# Patient Record
Sex: Male | Born: 1965 | Race: White | Hispanic: No | Marital: Single | Smoking: Current every day smoker
Health system: Southern US, Community
[De-identification: ages and names within clinical notes are randomized; demographics above are authoritative.]

## PROBLEM LIST (undated history)

## (undated) DIAGNOSIS — J45909 Unspecified asthma, uncomplicated: Secondary | ICD-10-CM

---

## 2011-12-06 ENCOUNTER — Ambulatory Visit: Payer: Self-pay | Admitting: Otolaryngology

## 2019-11-17 ENCOUNTER — Other Ambulatory Visit (HOSPITAL_COMMUNITY): Payer: Self-pay | Admitting: Orthopedic Surgery

## 2019-11-17 ENCOUNTER — Other Ambulatory Visit: Payer: Self-pay | Admitting: Orthopedic Surgery

## 2019-11-17 ENCOUNTER — Ambulatory Visit: Admission: RE | Admit: 2019-11-17 | Payer: Self-pay | Source: Ambulatory Visit

## 2019-11-17 DIAGNOSIS — M7989 Other specified soft tissue disorders: Secondary | ICD-10-CM

## 2019-11-23 ENCOUNTER — Ambulatory Visit
Admission: RE | Admit: 2019-11-23 | Discharge: 2019-11-23 | Disposition: A | Discharge status: Home / Self Care | Payer: Self-pay | Source: Ambulatory Visit | Attending: Orthopedic Surgery | Admitting: Orthopedic Surgery

## 2019-11-23 ENCOUNTER — Other Ambulatory Visit: Payer: Self-pay

## 2019-11-23 DIAGNOSIS — M7989 Other specified soft tissue disorders: Secondary | ICD-10-CM | POA: Insufficient documentation

## 2019-11-23 IMAGING — US US EXTREM LOW VENOUS*R*
1 series · 14 of 24 positions shown · non-contrast
Comparison: None.

CLINICAL DATA: Right lower leg swelling for 1 week

EXAM:
RIGHT LOWER EXTREMITY VENOUS DOPPLER ULTRASOUND
TECHNIQUE: Gray-scale sonography with compression, as well as color and duplex
ultrasound, were performed to evaluate the deep venous system(s)
from the level of the common femoral vein through the popliteal and
proximal calf veins.

[Series 1: us extrem low venous*right* · 0.08mm/px · 14 of 47 slices shown]
[im 1/47]
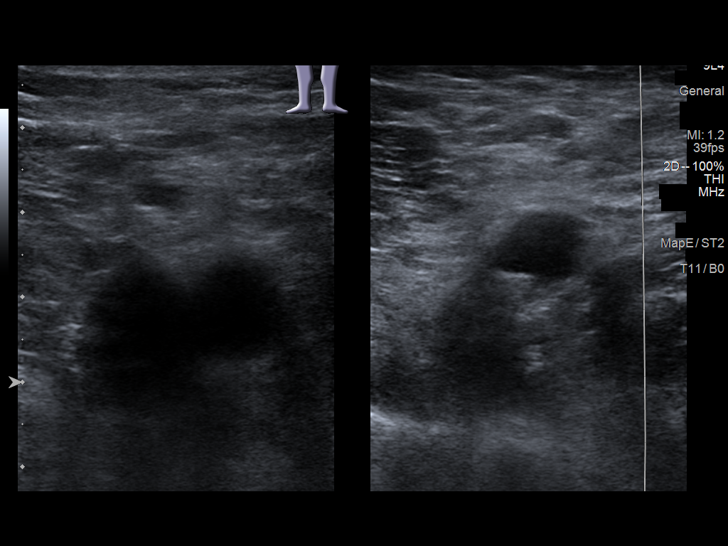
[im 5/47]
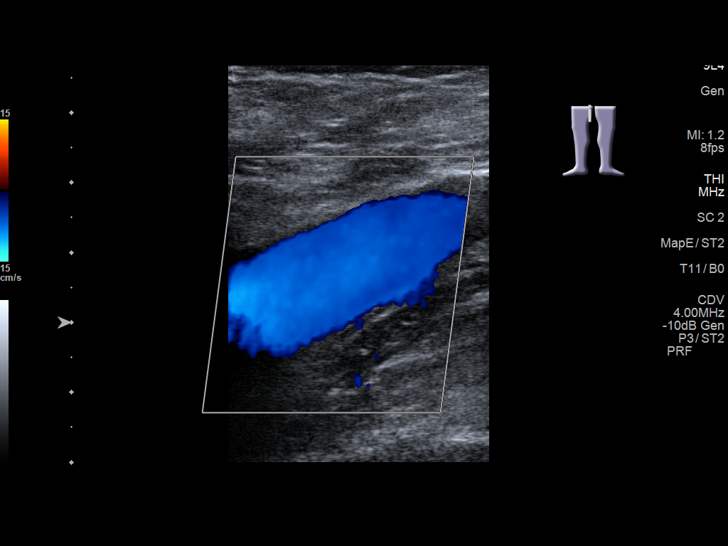
[im 9/47]
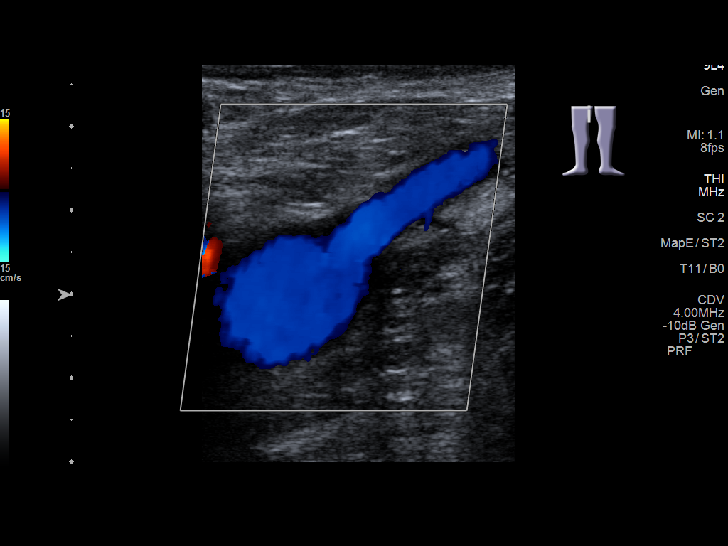
[im 13/47]
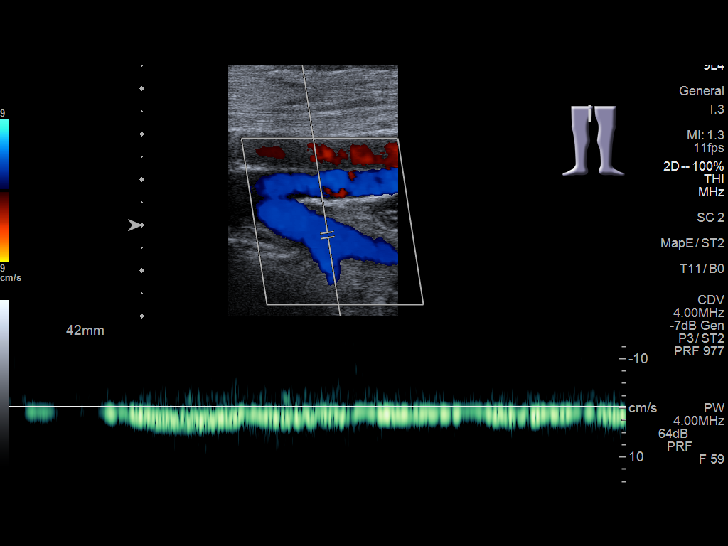
[im 15/47]
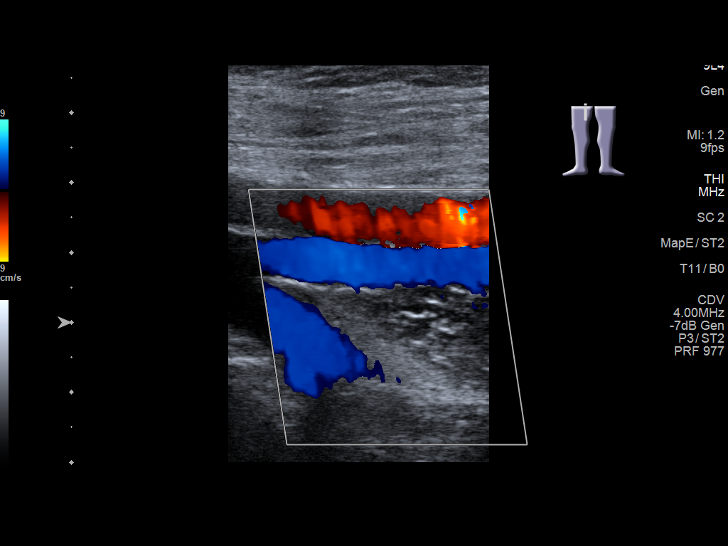
[im 19/47]
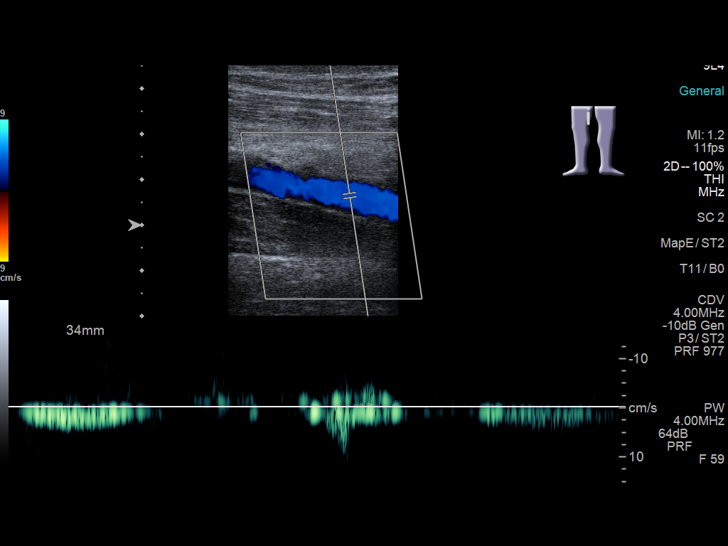
[im 23/47]
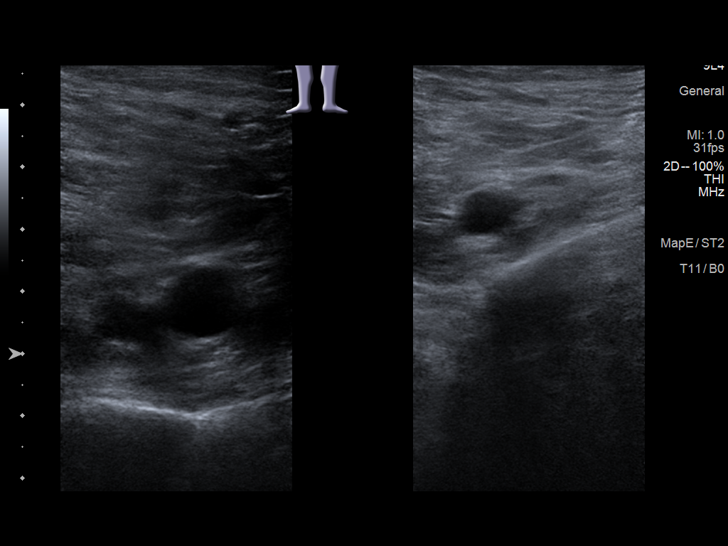
[im 25/47]
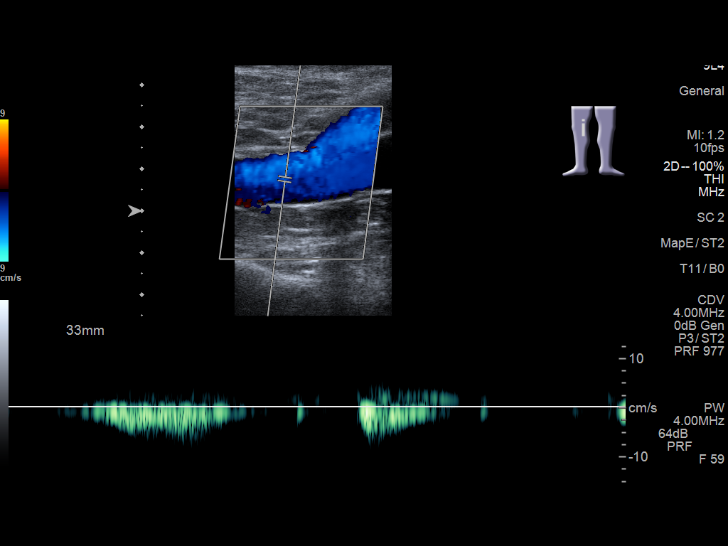
[im 29/47]
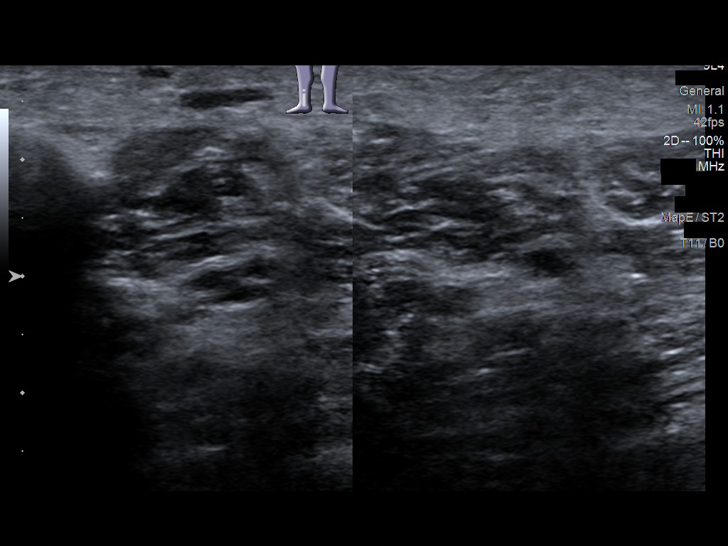
[im 33/47]
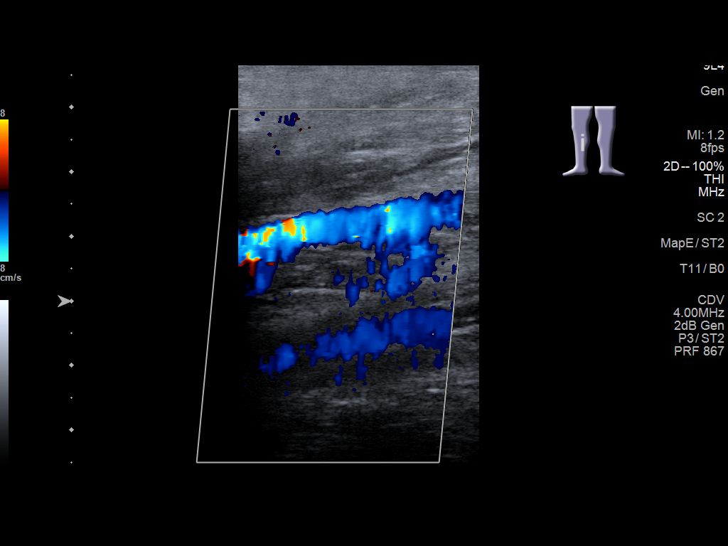
[im 37/47]
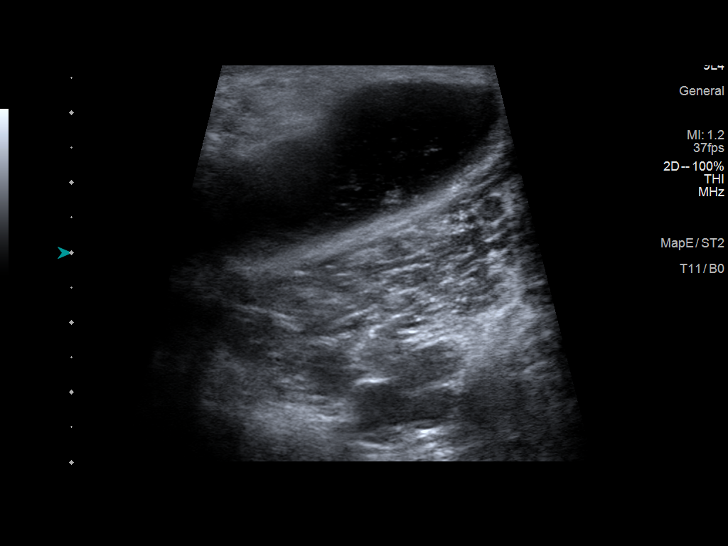
[im 39/47]
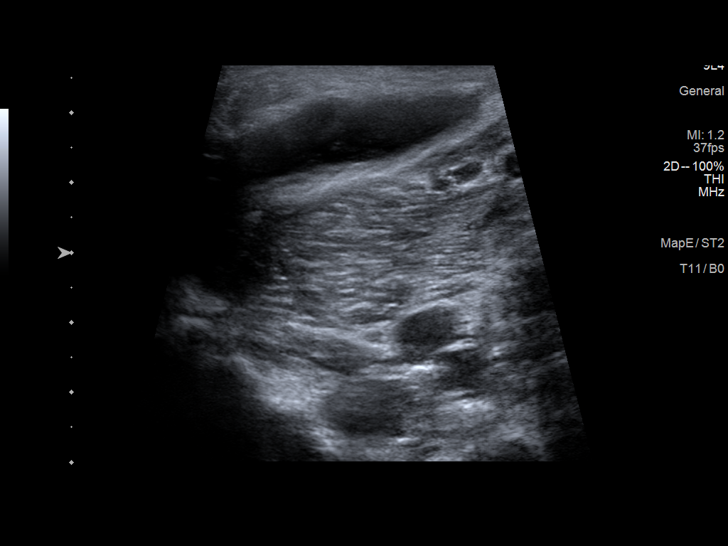
[im 43/47]
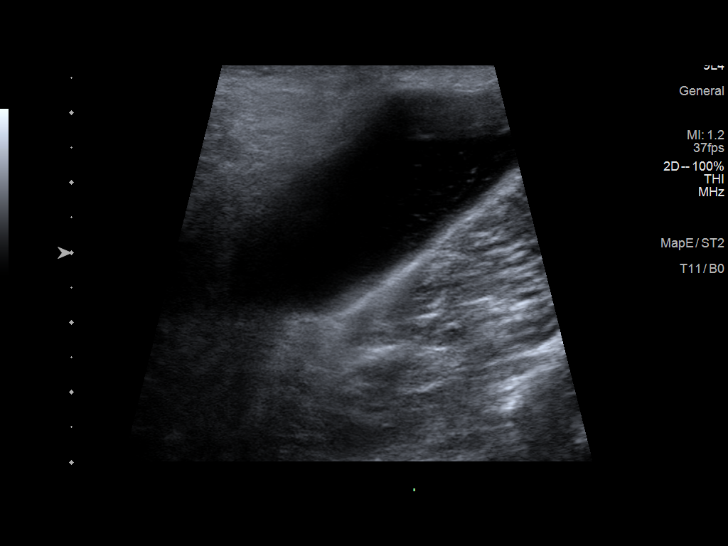
[im 47/47]
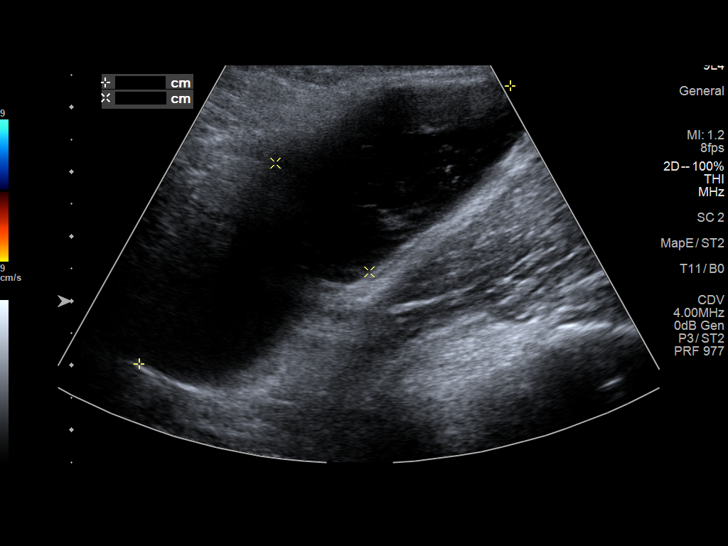

[14 of 24 positions shown; findings below may reference images not displayed]

FINDINGS: VENOUS

Normal compressibility of the common femoral, superficial femoral,
and popliteal veins, as well as the visualized calf veins.
Visualized portions of profunda femoral vein and great saphenous
vein unremarkable. No filling defects to suggest DVT on grayscale or
color Doppler imaging. Doppler waveforms show normal direction of
venous flow, normal respiratory plasticity and response to
augmentation.

Limited views of the contralateral common femoral vein are
unremarkable.

OTHER

Incidental 6.6 x 2.1 by 7.2 cm fluid collection in the popliteal
fossa compatible with a Baker's cyst.

Limitations: none
IMPRESSION: 1. No evidence of deep venous thrombosis within the right lower
extremity.
2. Large right Baker's cyst.

## 2020-04-25 IMAGING — CR DG HAND COMPLETE 3+V*R*
1 series · 3 of 3 positions shown · non-contrast
Comparison: None

CLINICAL DATA: Diaphoresis.  Pain.

EXAM:
RIGHT HAND - COMPLETE 3+ VIEW

[Series 1: dg hand complete right · 0.14mm/px · 3 of 3 slices shown]
[im 1/3]
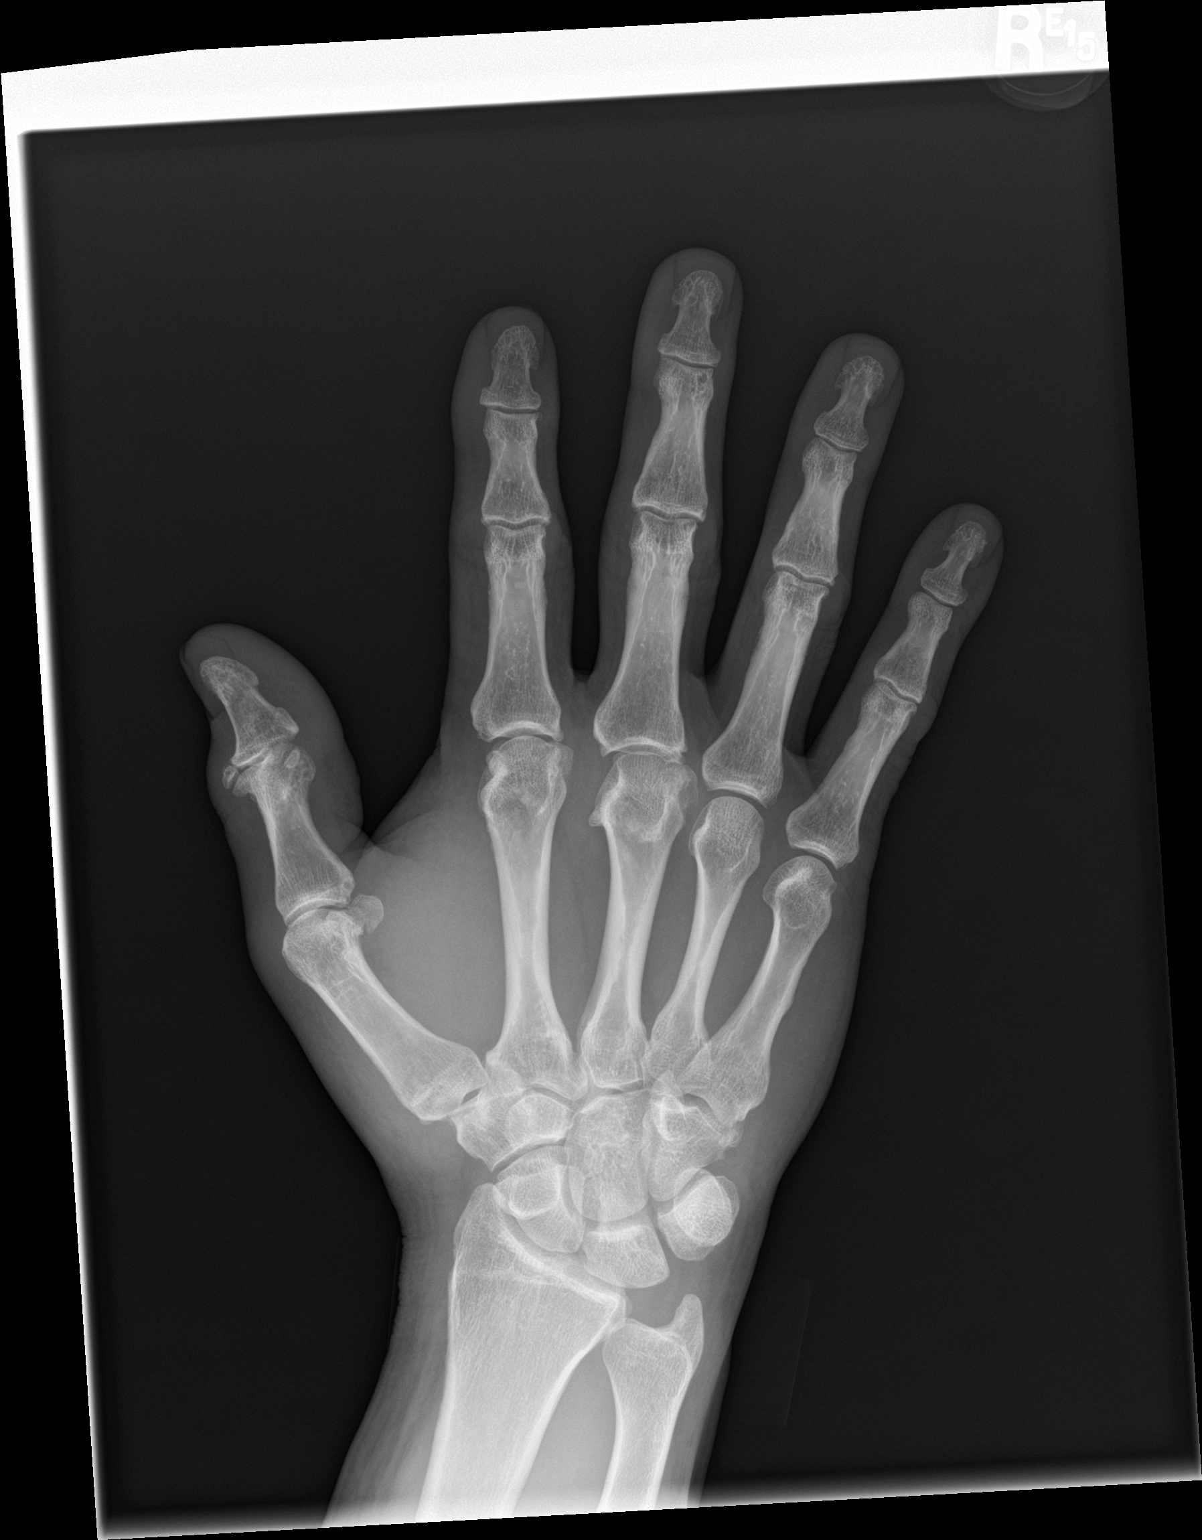
[im 2/3]
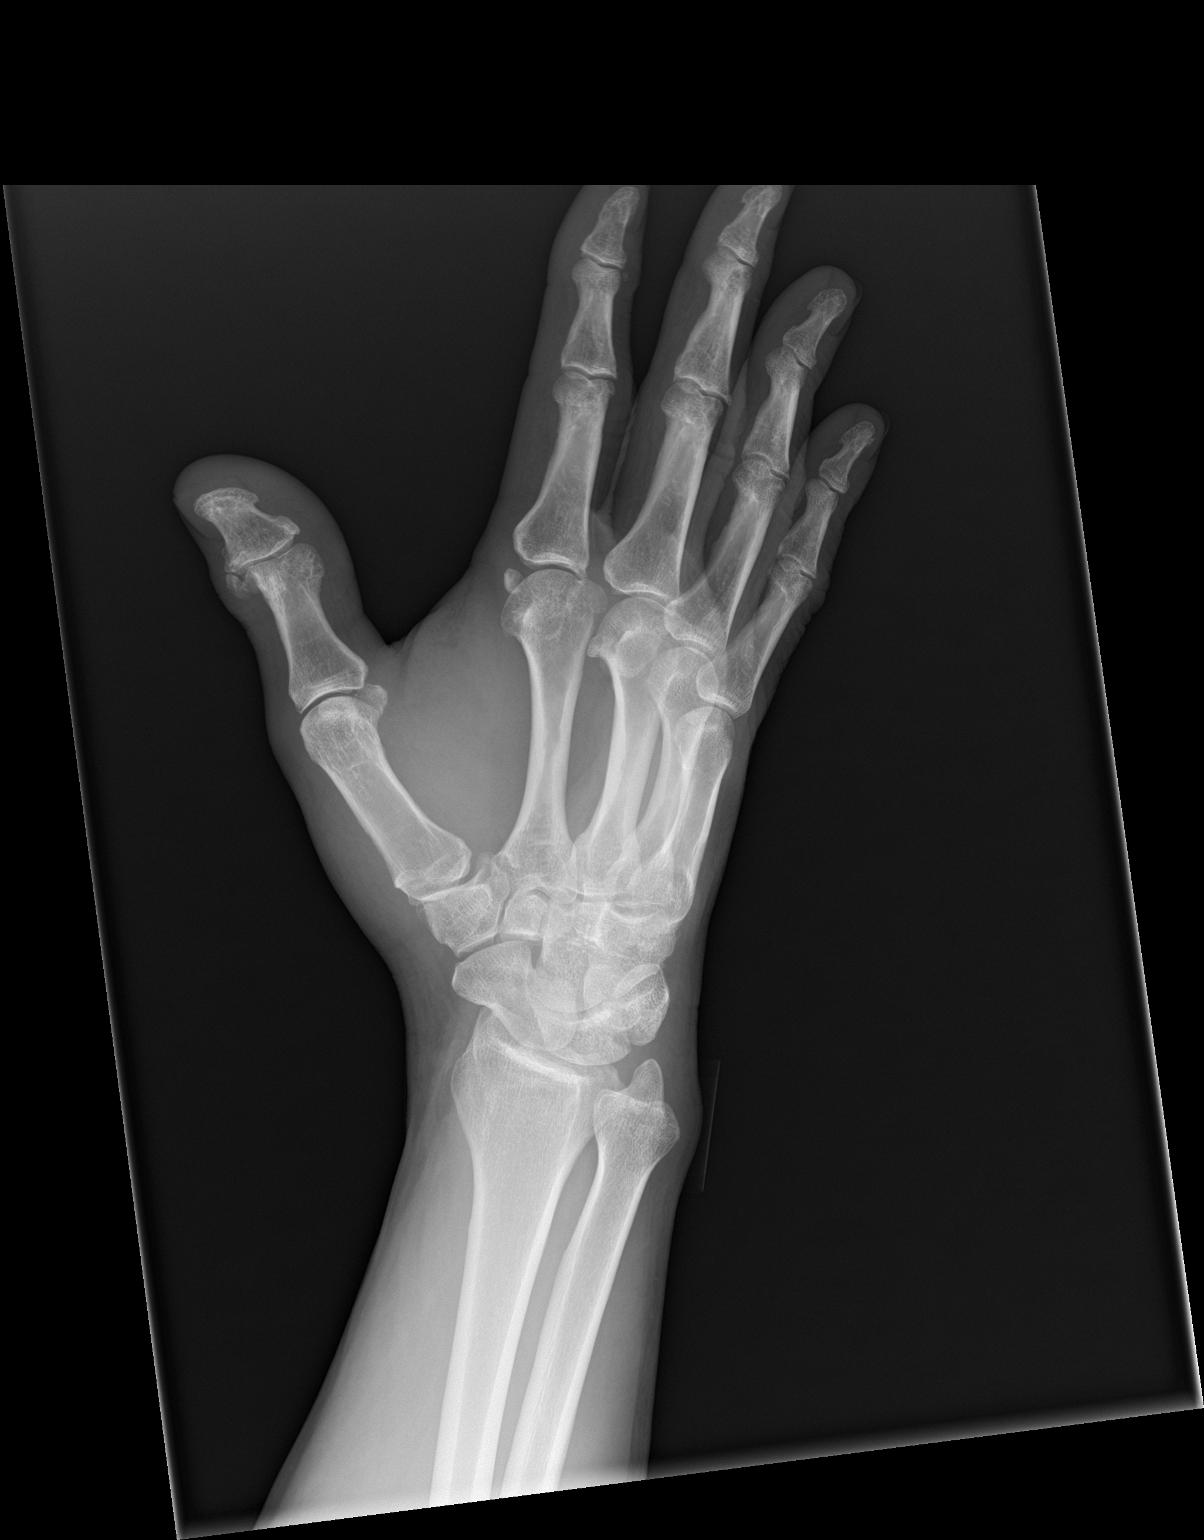
[im 3/3]
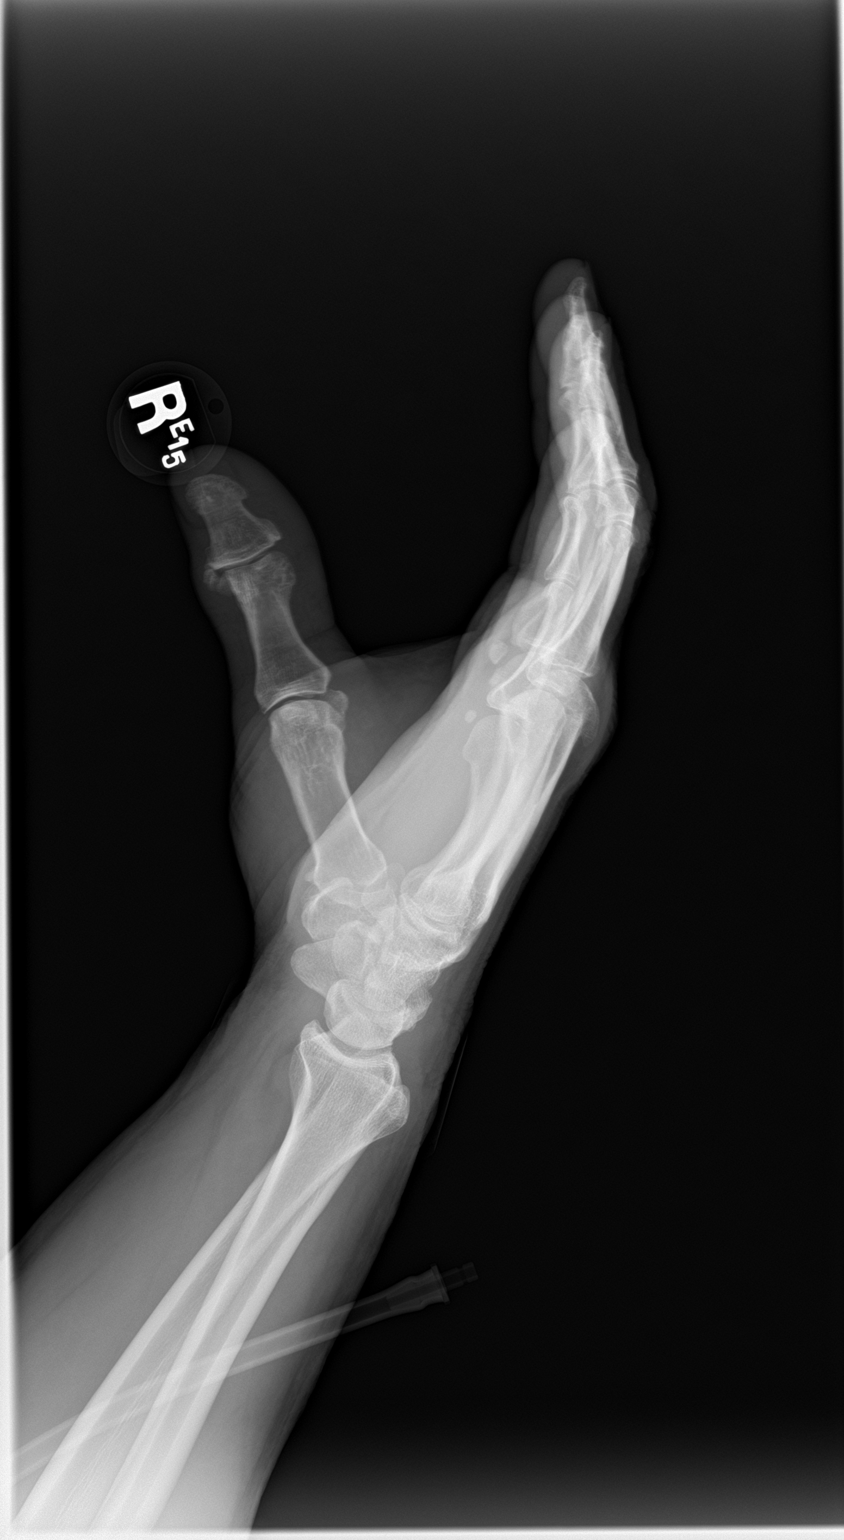

[3 of 3 positions shown; findings below may reference images not displayed]

FINDINGS: No acute or traumatic finding. Chronic degenerative arthritis of the
inter phalangeal joint of the thumb.
IMPRESSION: No acute or traumatic finding.

## 2020-11-21 ENCOUNTER — Other Ambulatory Visit: Payer: Self-pay

## 2020-11-21 ENCOUNTER — Emergency Department: Payer: Medicaid Other

## 2020-11-21 ENCOUNTER — Inpatient Hospital Stay
Admission: EM | Admit: 2020-11-21 | Discharge: 2020-12-12 | DRG: 870 | Disposition: A | Discharge status: Home Health | Payer: Medicaid Other | Attending: Internal Medicine | Admitting: Internal Medicine

## 2020-11-21 DIAGNOSIS — S32010A Wedge compression fracture of first lumbar vertebra, initial encounter for closed fracture: Secondary | ICD-10-CM

## 2020-11-21 DIAGNOSIS — J9621 Acute and chronic respiratory failure with hypoxia: Secondary | ICD-10-CM

## 2020-11-21 DIAGNOSIS — J9690 Respiratory failure, unspecified, unspecified whether with hypoxia or hypercapnia: Secondary | ICD-10-CM

## 2020-11-21 DIAGNOSIS — E870 Hyperosmolality and hypernatremia: Secondary | ICD-10-CM | POA: Diagnosis not present

## 2020-11-21 DIAGNOSIS — E43 Unspecified severe protein-calorie malnutrition: Secondary | ICD-10-CM | POA: Diagnosis present

## 2020-11-21 DIAGNOSIS — J189 Pneumonia, unspecified organism: Secondary | ICD-10-CM

## 2020-11-21 DIAGNOSIS — A419 Sepsis, unspecified organism: Principal | ICD-10-CM | POA: Diagnosis present

## 2020-11-21 DIAGNOSIS — Z4659 Encounter for fitting and adjustment of other gastrointestinal appliance and device: Secondary | ICD-10-CM

## 2020-11-21 DIAGNOSIS — R296 Repeated falls: Secondary | ICD-10-CM | POA: Diagnosis present

## 2020-11-21 DIAGNOSIS — K567 Ileus, unspecified: Secondary | ICD-10-CM | POA: Diagnosis not present

## 2020-11-21 DIAGNOSIS — Y92009 Unspecified place in unspecified non-institutional (private) residence as the place of occurrence of the external cause: Secondary | ICD-10-CM

## 2020-11-21 DIAGNOSIS — R6520 Severe sepsis without septic shock: Secondary | ICD-10-CM | POA: Diagnosis not present

## 2020-11-21 DIAGNOSIS — Z6836 Body mass index (BMI) 36.0-36.9, adult: Secondary | ICD-10-CM | POA: Diagnosis not present

## 2020-11-21 DIAGNOSIS — J441 Chronic obstructive pulmonary disease with (acute) exacerbation: Secondary | ICD-10-CM

## 2020-11-21 DIAGNOSIS — I5021 Acute systolic (congestive) heart failure: Secondary | ICD-10-CM | POA: Diagnosis not present

## 2020-11-21 DIAGNOSIS — F17290 Nicotine dependence, other tobacco product, uncomplicated: Secondary | ICD-10-CM | POA: Diagnosis present

## 2020-11-21 DIAGNOSIS — J9602 Acute respiratory failure with hypercapnia: Secondary | ICD-10-CM | POA: Diagnosis present

## 2020-11-21 DIAGNOSIS — F10931 Alcohol use, unspecified with withdrawal delirium: Secondary | ICD-10-CM

## 2020-11-21 DIAGNOSIS — J90 Pleural effusion, not elsewhere classified: Secondary | ICD-10-CM

## 2020-11-21 DIAGNOSIS — J9819 Other pulmonary collapse: Secondary | ICD-10-CM | POA: Diagnosis not present

## 2020-11-21 DIAGNOSIS — Z20822 Contact with and (suspected) exposure to covid-19: Secondary | ICD-10-CM | POA: Diagnosis present

## 2020-11-21 DIAGNOSIS — Z9889 Other specified postprocedural states: Secondary | ICD-10-CM

## 2020-11-21 DIAGNOSIS — J439 Emphysema, unspecified: Secondary | ICD-10-CM | POA: Diagnosis present

## 2020-11-21 DIAGNOSIS — G928 Other toxic encephalopathy: Secondary | ICD-10-CM | POA: Diagnosis not present

## 2020-11-21 DIAGNOSIS — R52 Pain, unspecified: Secondary | ICD-10-CM

## 2020-11-21 DIAGNOSIS — J690 Pneumonitis due to inhalation of food and vomit: Secondary | ICD-10-CM | POA: Diagnosis not present

## 2020-11-21 DIAGNOSIS — R0902 Hypoxemia: Secondary | ICD-10-CM

## 2020-11-21 DIAGNOSIS — R509 Fever, unspecified: Secondary | ICD-10-CM

## 2020-11-21 DIAGNOSIS — F09 Unspecified mental disorder due to known physiological condition: Secondary | ICD-10-CM | POA: Diagnosis not present

## 2020-11-21 DIAGNOSIS — E861 Hypovolemia: Secondary | ICD-10-CM | POA: Diagnosis present

## 2020-11-21 DIAGNOSIS — R0602 Shortness of breath: Secondary | ICD-10-CM | POA: Diagnosis present

## 2020-11-21 DIAGNOSIS — S32019A Unspecified fracture of first lumbar vertebra, initial encounter for closed fracture: Secondary | ICD-10-CM | POA: Diagnosis present

## 2020-11-21 DIAGNOSIS — E669 Obesity, unspecified: Secondary | ICD-10-CM | POA: Diagnosis present

## 2020-11-21 DIAGNOSIS — F10239 Alcohol dependence with withdrawal, unspecified: Secondary | ICD-10-CM | POA: Diagnosis present

## 2020-11-21 DIAGNOSIS — Z23 Encounter for immunization: Secondary | ICD-10-CM | POA: Diagnosis not present

## 2020-11-21 DIAGNOSIS — F10231 Alcohol dependence with withdrawal delirium: Secondary | ICD-10-CM | POA: Diagnosis present

## 2020-11-21 DIAGNOSIS — R4182 Altered mental status, unspecified: Secondary | ICD-10-CM

## 2020-11-21 DIAGNOSIS — W19XXXA Unspecified fall, initial encounter: Secondary | ICD-10-CM | POA: Diagnosis present

## 2020-11-21 DIAGNOSIS — R531 Weakness: Secondary | ICD-10-CM

## 2020-11-21 DIAGNOSIS — Z978 Presence of other specified devices: Secondary | ICD-10-CM

## 2020-11-21 DIAGNOSIS — E8770 Fluid overload, unspecified: Secondary | ICD-10-CM

## 2020-11-21 DIAGNOSIS — J9601 Acute respiratory failure with hypoxia: Secondary | ICD-10-CM | POA: Diagnosis present

## 2020-11-21 DIAGNOSIS — R609 Edema, unspecified: Secondary | ICD-10-CM

## 2020-11-21 DIAGNOSIS — Z9911 Dependence on respirator [ventilator] status: Secondary | ICD-10-CM

## 2020-11-21 DIAGNOSIS — G9340 Encephalopathy, unspecified: Secondary | ICD-10-CM

## 2020-11-21 HISTORY — DX: Unspecified asthma, uncomplicated: J45.909

## 2020-11-21 LAB — BASIC METABOLIC PANEL
Anion gap: 18 — ABNORMAL HIGH (ref 5–15)
BUN: 26 mg/dL — ABNORMAL HIGH (ref 6–20)
CO2: 24 mmol/L (ref 22–32)
Calcium: 9.4 mg/dL (ref 8.9–10.3)
Chloride: 91 mmol/L — ABNORMAL LOW (ref 98–111)
Creatinine, Ser: 1.08 mg/dL (ref 0.61–1.24)
GFR, Estimated: >60 mL/min (ref >60)
Glucose, Bld: 118 mg/dL — ABNORMAL HIGH (ref 70–99)
Potassium: 3.3 mmol/L — ABNORMAL LOW (ref 3.5–5.1)
Sodium: 133 mmol/L — ABNORMAL LOW (ref 135–145)

## 2020-11-21 LAB — LACTIC ACID, PLASMA
Lactic Acid, Venous: 3.6 mmol/L (ref 0.5–1.9)
Lactic Acid, Venous: 3 mmol/L (ref 0.5–1.9)
Lactic Acid, Venous: 1.5 mmol/L (ref 0.5–1.9)

## 2020-11-21 LAB — URINE DRUG SCREEN, QUALITATIVE (ARMC ONLY)
Amphetamines, Ur Screen: NOT DETECTED
Barbiturates, Ur Screen: NOT DETECTED
Benzodiazepine, Ur Scrn: POSITIVE — AB
Cannabinoid 50 Ng, Ur ~~LOC~~: POSITIVE — AB
Cocaine Metabolite,Ur ~~LOC~~: NOT DETECTED
MDMA (Ecstasy)Ur Screen: NOT DETECTED
Methadone Scn, Ur: NOT DETECTED
Opiate, Ur Screen: NOT DETECTED
Phencyclidine (PCP) Ur S: NOT DETECTED
Tricyclic, Ur Screen: POSITIVE — AB

## 2020-11-21 LAB — CBC WITH DIFFERENTIAL/PLATELET
Abs Immature Granulocytes: 0.74 10*3/uL — ABNORMAL HIGH (ref 0.00–0.07)
Basophils Absolute: 0.1 10*3/uL (ref 0.0–0.1)
Basophils Relative: 0 %
Eosinophils Absolute: 0.1 10*3/uL (ref 0.0–0.5)
Eosinophils Relative: 1 %
HCT: 46.8 % (ref 39.0–52.0)
Hemoglobin: 16.6 g/dL (ref 13.0–17.0)
Immature Granulocytes: 5 %
Lymphocytes Relative: 3 %
Lymphs Abs: 0.5 10*3/uL — ABNORMAL LOW (ref 0.7–4.0)
MCH: 35 pg — ABNORMAL HIGH (ref 26.0–34.0)
MCHC: 35.5 g/dL (ref 30.0–36.0)
MCV: 98.7 fL (ref 80.0–100.0)
Monocytes Absolute: 1.2 10*3/uL — ABNORMAL HIGH (ref 0.1–1.0)
Monocytes Relative: 7 %
Neutro Abs: 13 10*3/uL — ABNORMAL HIGH (ref 1.7–7.7)
Neutrophils Relative %: 84 %
Platelets: 207 10*3/uL (ref 150–400)
RBC: 4.74 MIL/uL (ref 4.22–5.81)
RDW: 12.9 % (ref 11.5–15.5)
WBC: 15.6 10*3/uL — ABNORMAL HIGH (ref 4.0–10.5)
nRBC: 0 % (ref 0.0–0.2)

## 2020-11-21 LAB — HEPATIC FUNCTION PANEL
ALT: 26 U/L (ref 0–44)
AST: 42 U/L — ABNORMAL HIGH (ref 15–41)
Albumin: 3.8 g/dL (ref 3.5–5.0)
Alkaline Phosphatase: 90 U/L (ref 38–126)
Bilirubin, Direct: 0.7 mg/dL — ABNORMAL HIGH (ref 0.0–0.2)
Indirect Bilirubin: 1.3 mg/dL — ABNORMAL HIGH (ref 0.3–0.9)
Total Bilirubin: 2 mg/dL — ABNORMAL HIGH (ref 0.3–1.2)
Total Protein: 8.4 g/dL — ABNORMAL HIGH (ref 6.5–8.1)

## 2020-11-21 LAB — RESP PANEL BY RT-PCR (FLU A&B, COVID) ARPGX2
Influenza A by PCR: NEGATIVE
Influenza B by PCR: NEGATIVE
SARS Coronavirus 2 by RT PCR: NEGATIVE

## 2020-11-21 LAB — TROPONIN I (HIGH SENSITIVITY)
Troponin I (High Sensitivity): 12 ng/L (ref <18)
Troponin I (High Sensitivity): 17 ng/L (ref <18)

## 2020-11-21 LAB — BLOOD GAS, ARTERIAL
Acid-Base Excess: 1.7 mmol/L (ref 0.0–2.0)
Acid-Base Excess: 0.8 mmol/L (ref 0.0–2.0)
Bicarbonate: 24.4 mmol/L (ref 20.0–28.0)
Bicarbonate: 27.1 mmol/L (ref 20.0–28.0)
FIO2: 0.21
FIO2: 0.6
MECHVT: 500 mL
O2 Saturation: 96.7 %
O2 Saturation: 99.1 %
PEEP: 5 cmH2O
Patient temperature: 37
Patient temperature: 37
RATE: 18 resp/min
pCO2 arterial: 32 mmHg (ref 32.0–48.0)
pCO2 arterial: 49 mmHg — ABNORMAL HIGH (ref 32.0–48.0)
pH, Arterial: 7.49 — ABNORMAL HIGH (ref 7.350–7.450)
pH, Arterial: 7.35 (ref 7.350–7.450)
pO2, Arterial: 80 mmHg — ABNORMAL LOW (ref 83.0–108.0)
pO2, Arterial: 141 mmHg — ABNORMAL HIGH (ref 83.0–108.0)

## 2020-11-21 LAB — URINALYSIS, ROUTINE W REFLEX MICROSCOPIC
Glucose, UA: 100 mg/dL — AB
Ketones, ur: 40 mg/dL — AB
Leukocytes,Ua: NEGATIVE
Nitrite: NEGATIVE
Protein, ur: 30 mg/dL — AB
Specific Gravity, Urine: 1.015 (ref 1.005–1.030)
pH: 5.5 (ref 5.0–8.0)

## 2020-11-21 LAB — PROCALCITONIN: Procalcitonin: 12.87 ng/mL

## 2020-11-21 LAB — URINALYSIS, MICROSCOPIC (REFLEX)
Bacteria, UA: NONE SEEN
Squamous Epithelial / HPF: NONE SEEN (ref 0–5)

## 2020-11-21 LAB — BRAIN NATRIURETIC PEPTIDE: B Natriuretic Peptide: 108.8 pg/mL — ABNORMAL HIGH (ref 0.0–100.0)

## 2020-11-21 LAB — PHOSPHORUS: Phosphorus: 3.5 mg/dL (ref 2.5–4.6)

## 2020-11-21 LAB — AMYLASE: Amylase: 32 U/L (ref 28–100)

## 2020-11-21 LAB — MRSA NEXT GEN BY PCR, NASAL: MRSA by PCR Next Gen: NOT DETECTED

## 2020-11-21 LAB — TYPE AND SCREEN
ABO/RH(D): A POS
Antibody Screen: NEGATIVE

## 2020-11-21 LAB — GLUCOSE, CAPILLARY: Glucose-Capillary: 137 mg/dL — ABNORMAL HIGH (ref 70–99)

## 2020-11-21 LAB — ACETAMINOPHEN LEVEL: Acetaminophen (Tylenol), Serum: <10 ug/mL — ABNORMAL LOW (ref 10–30)

## 2020-11-21 LAB — OSMOLALITY: Osmolality: 286 mOsm/kg (ref 275–295)

## 2020-11-21 LAB — SALICYLATE LEVEL: Salicylate Lvl: <7 mg/dL — ABNORMAL LOW (ref 7.0–30.0)

## 2020-11-21 LAB — LIPASE, BLOOD: Lipase: 28 U/L (ref 11–51)

## 2020-11-21 IMAGING — CR DG CHEST 1V
1 series · 2 of 2 positions shown · non-contrast
Comparison: None.

CLINICAL DATA: SOB

EXAM:
CHEST  1 VIEW

[Series 1: dg chest 1 view · 0.14mm/px · 2 of 2 slices shown]
[im 1/2]
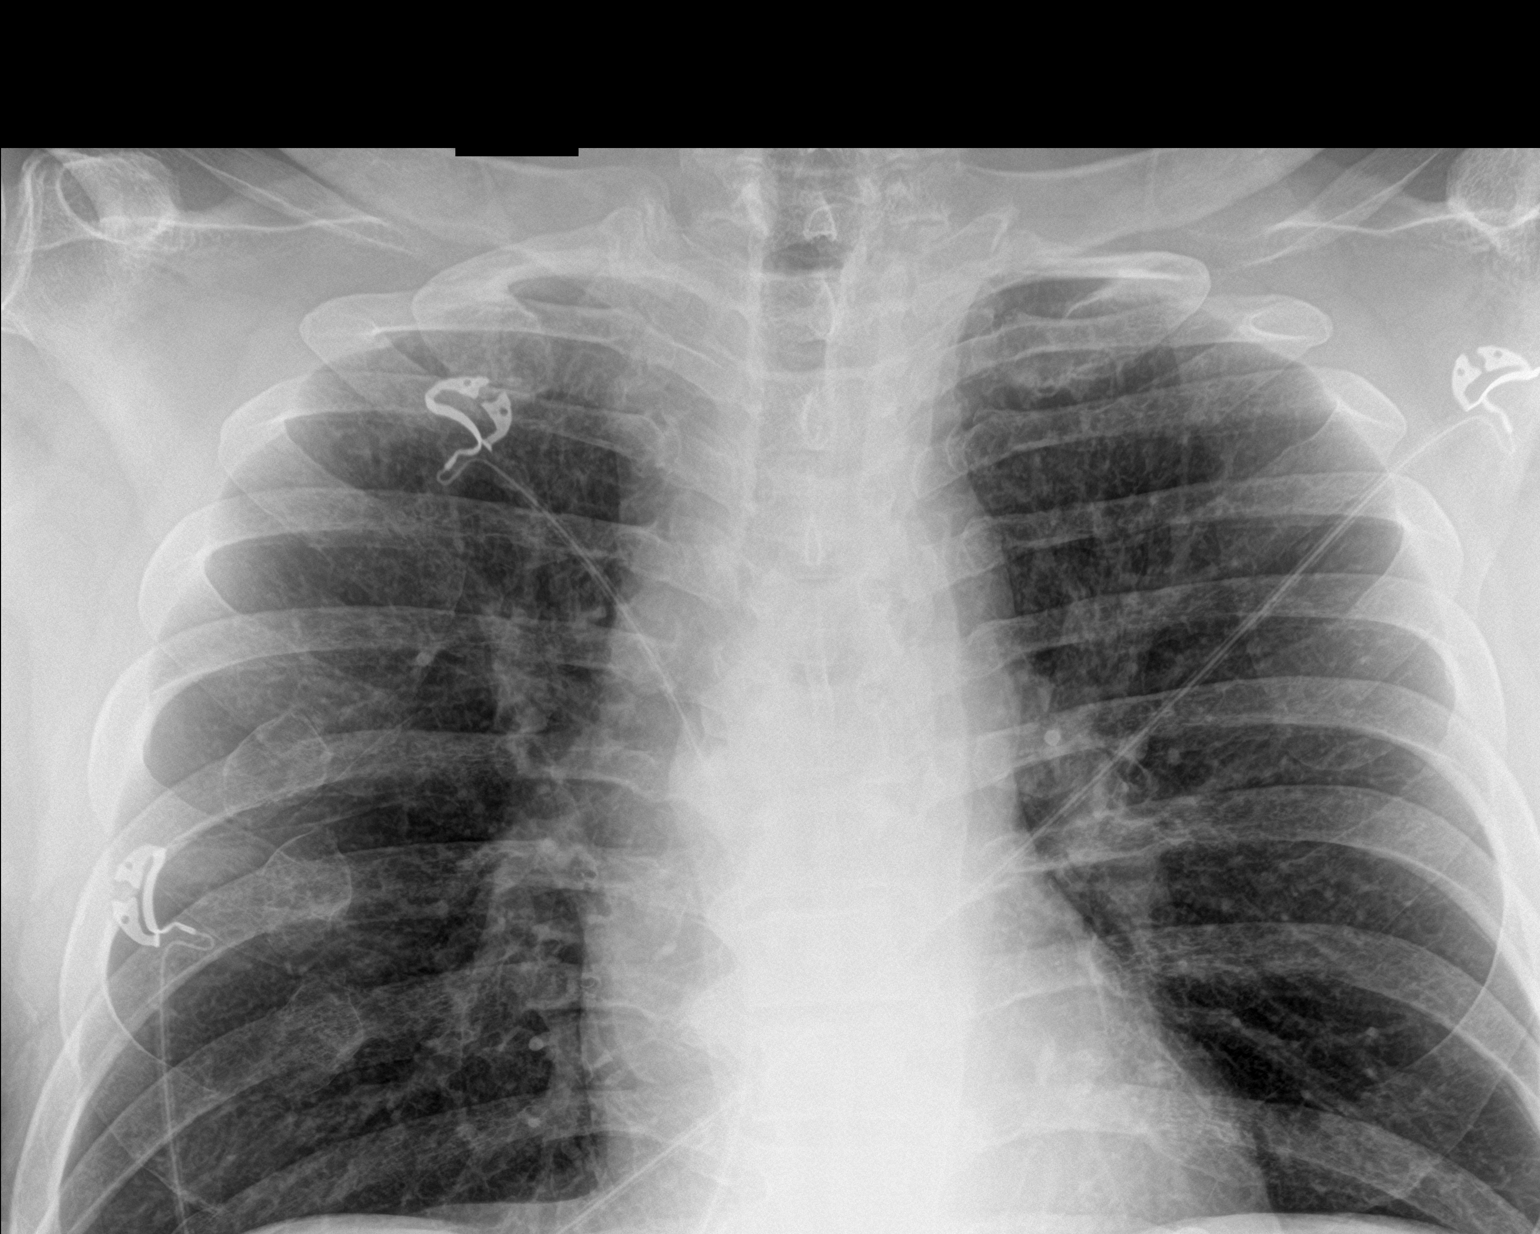
[im 2/2]
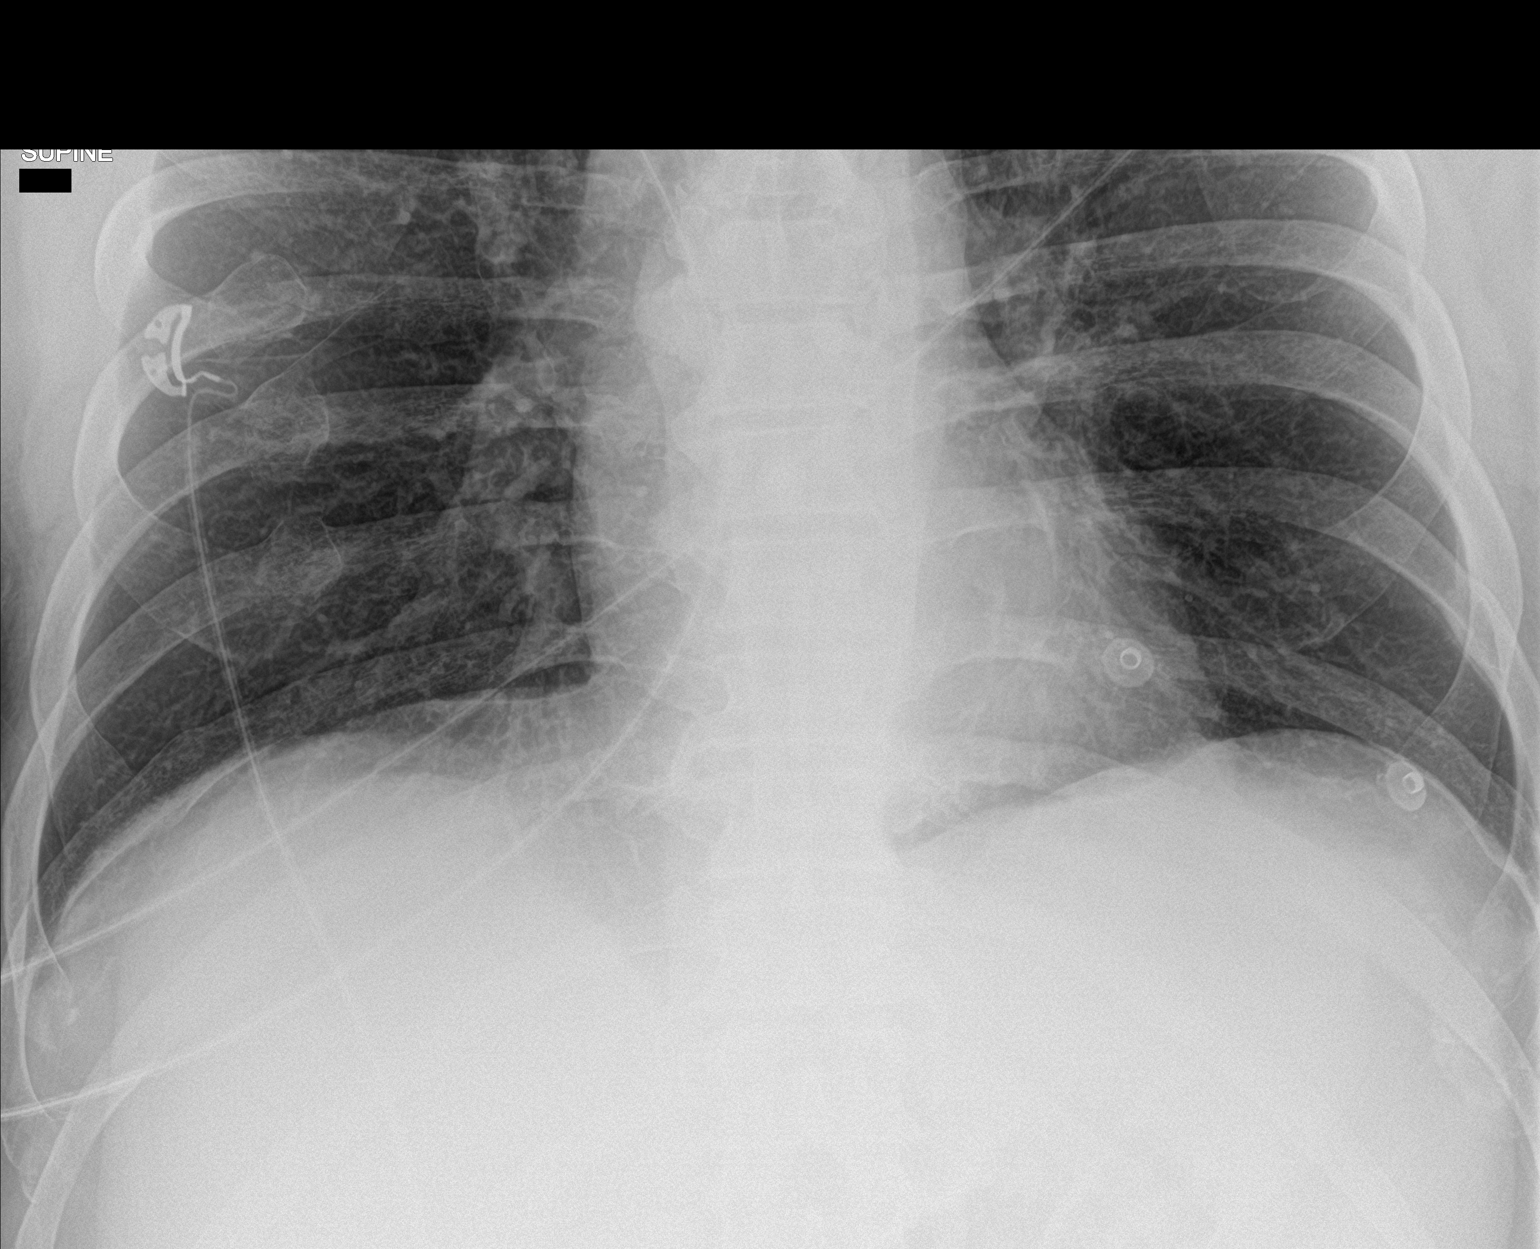

[2 of 2 positions shown; findings below may reference images not displayed]

FINDINGS: The cardiomediastinal silhouette is within normal limits. No pleural
effusion. No pneumothorax. No mass or consolidation. No acute
osseous abnormality.
IMPRESSION: No acute radiographic abnormality in the chest.  No lobar pneumonia.

## 2020-11-21 IMAGING — CR DG LUMBAR SPINE 2-3V
1 series · 3 of 3 positions shown · non-contrast
Comparison: None.

CLINICAL DATA: Back pain.  Diaphoresis.

EXAM:
LUMBAR SPINE - 2-3 VIEW

[Series 1: dg lumbar spine 2-3 views · 0.14mm/px · 3 of 3 slices shown]
[im 1/3]
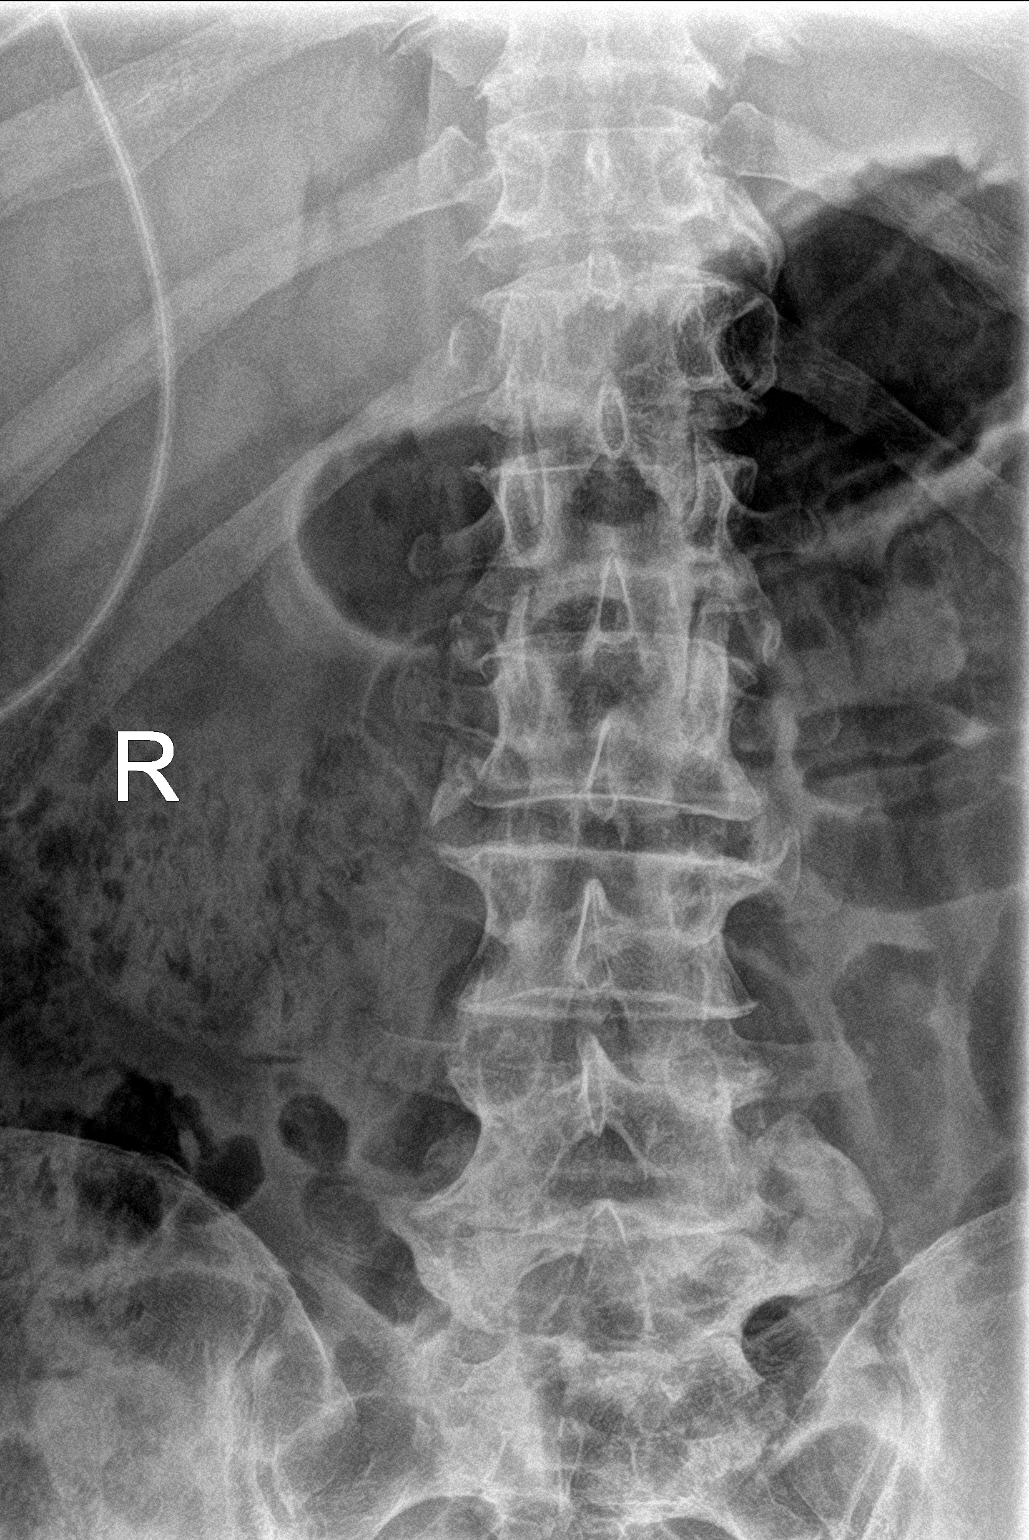
[im 2/3]
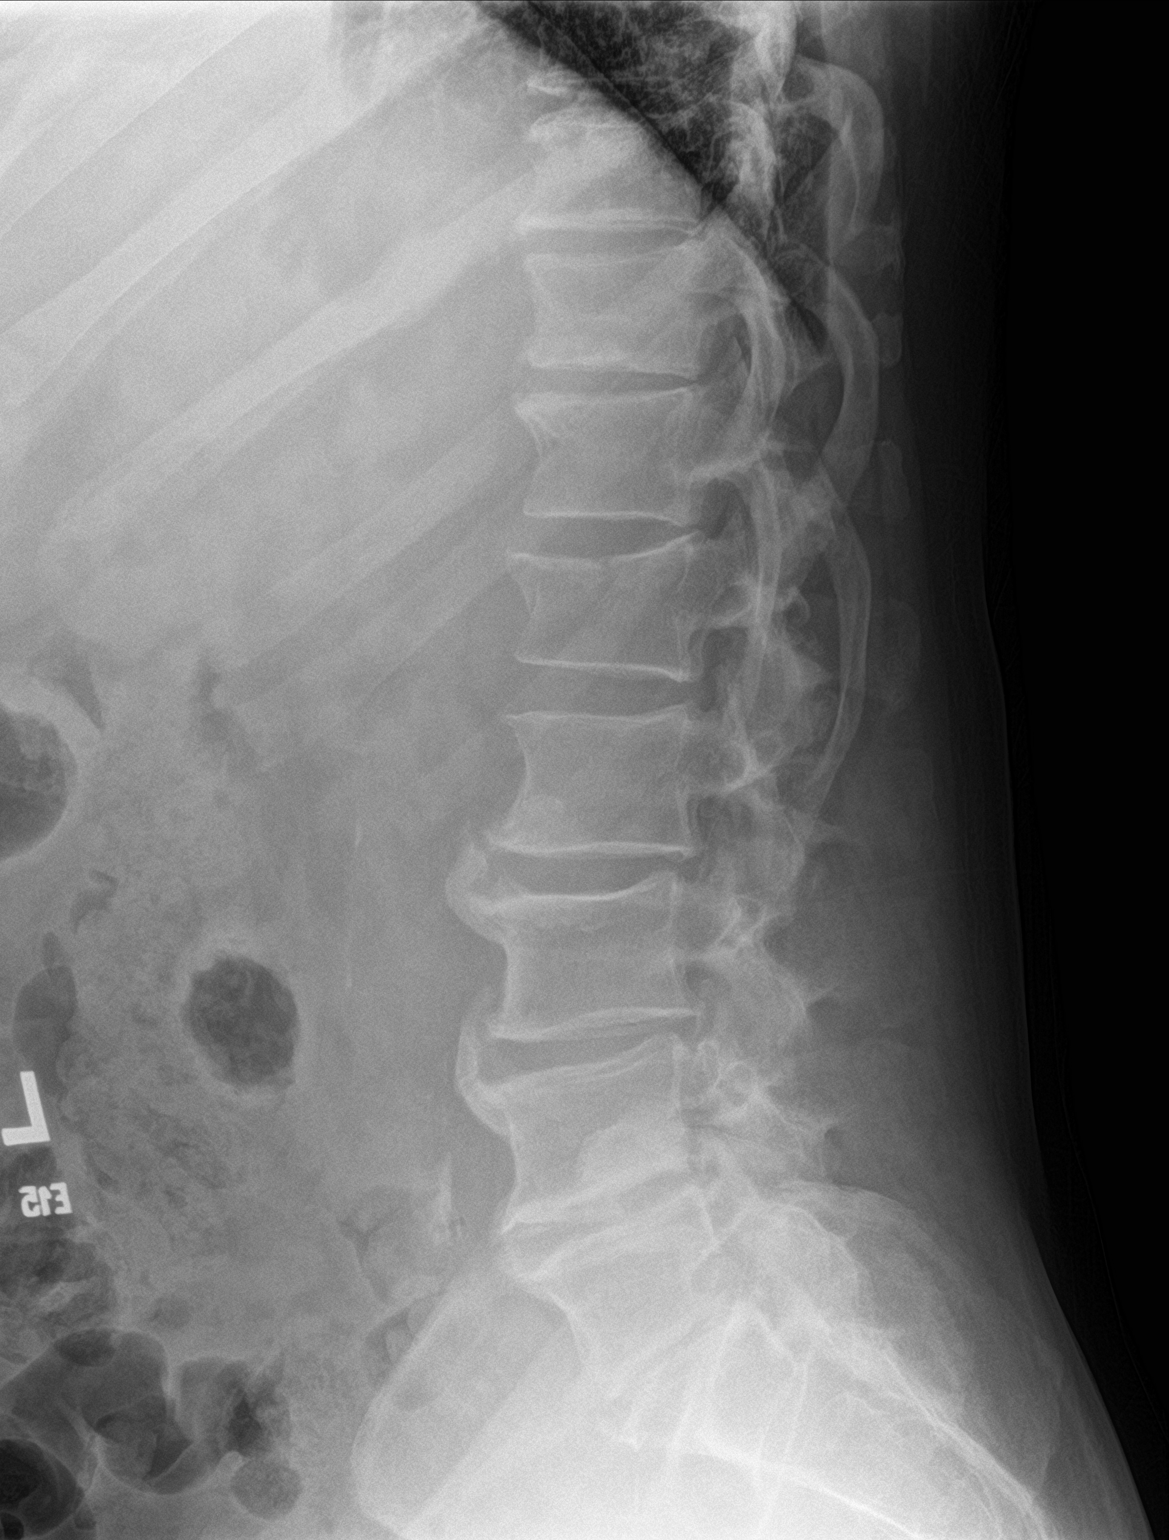
[im 3/3]
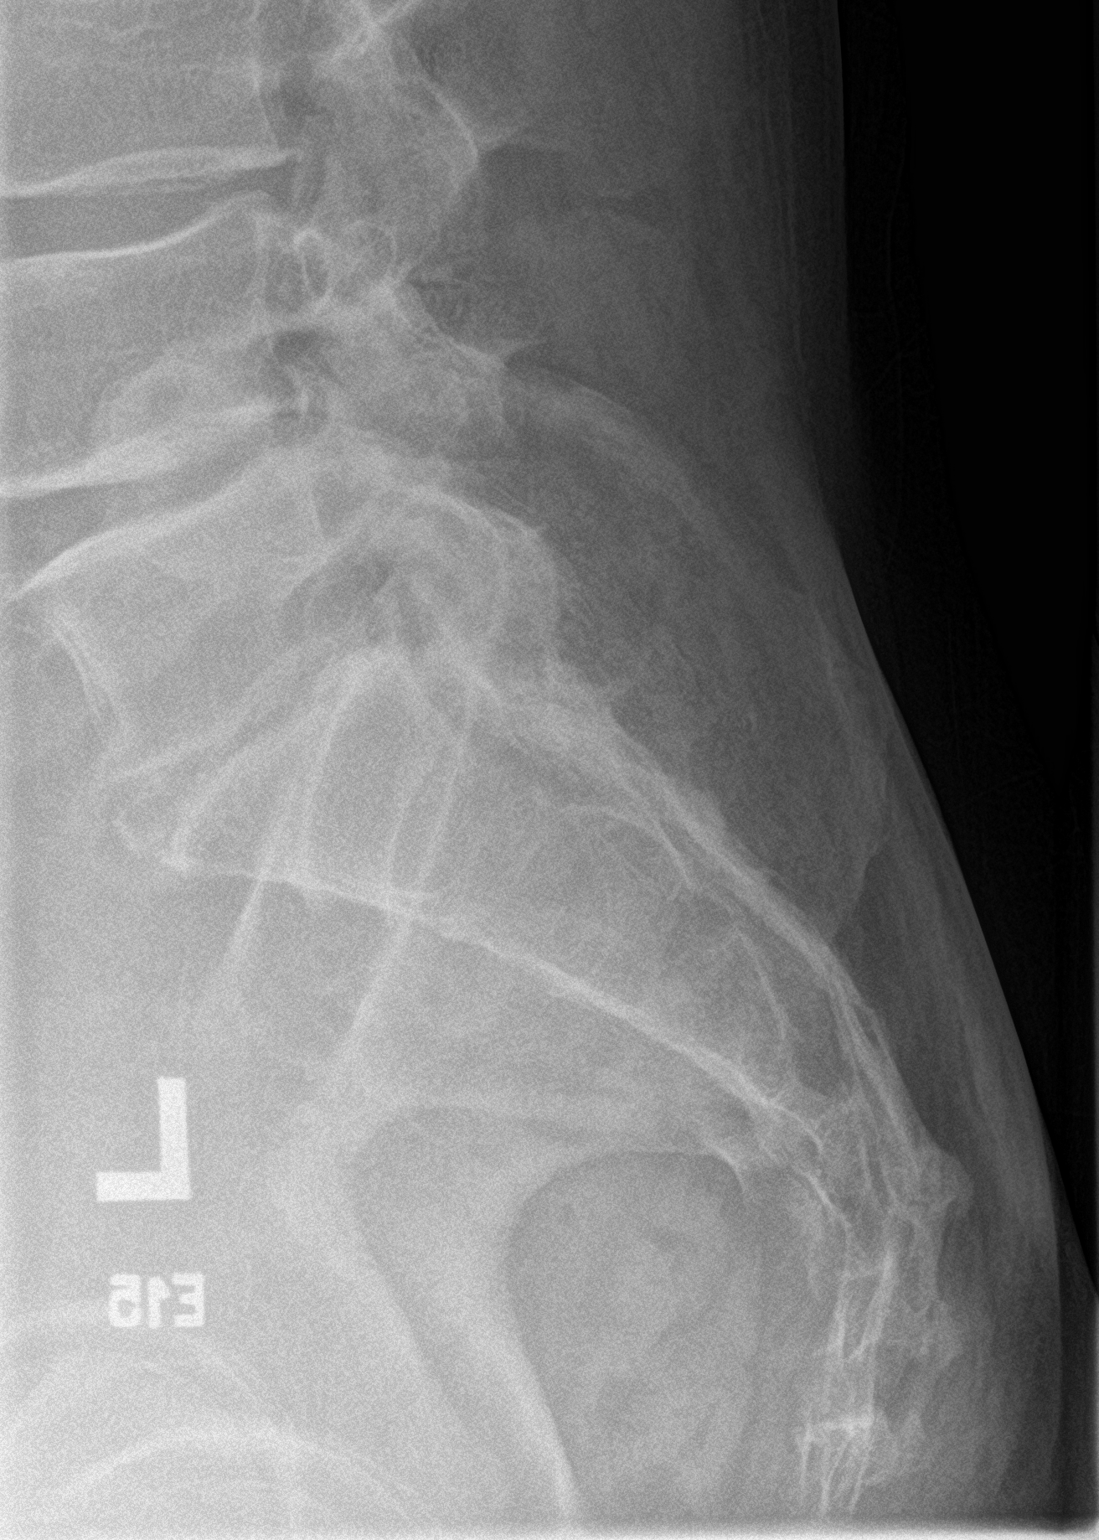

[3 of 3 positions shown; findings below may reference images not displayed]

FINDINGS: Five lumbar type vertebral bodies show normal alignment. There is an
acute superior endplate fracture at L1 with loss of height
anteriorly of 20%. No posterior retropulsion. Chronic lower lumbar
degenerative disc disease and degenerative facet disease is present.
IMPRESSION: Acute superior endplate fracture at L1 with loss of height
anteriorly of 20%.

## 2020-11-21 IMAGING — CT CT CERVICAL SPINE W/O CM
3 of 4 series · 11 of 33 positions shown, 13 images · non-contrast
Comparison: None.

CLINICAL DATA: Neck trauma.  Obtunded.

EXAM:
CT CERVICAL SPINE WITHOUT CONTRAST
TECHNIQUE: Multidetector CT imaging of the cervical spine was performed without
intravenous contrast. Multiplanar CT image reconstructions were also
generated.

[Series 6: sagittal bone · sagittal · 0.27mm/px · 5 of 58 slices shown, 6 images]
[im 20/58  bone]
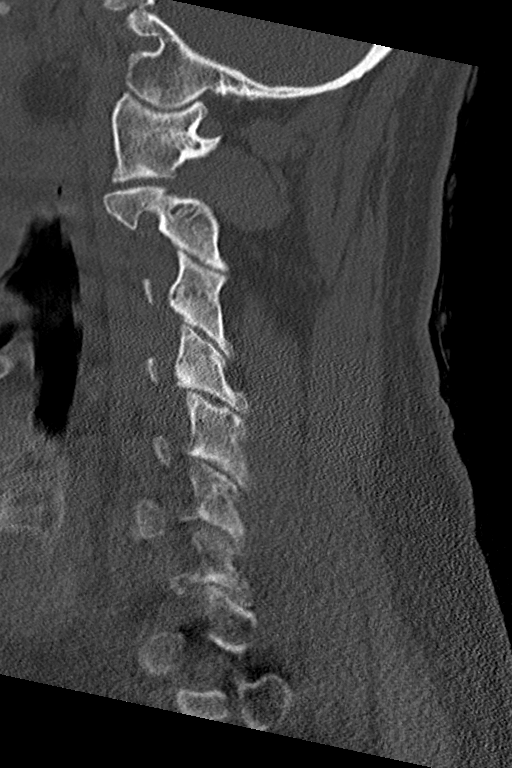
[im 24/58  bone]
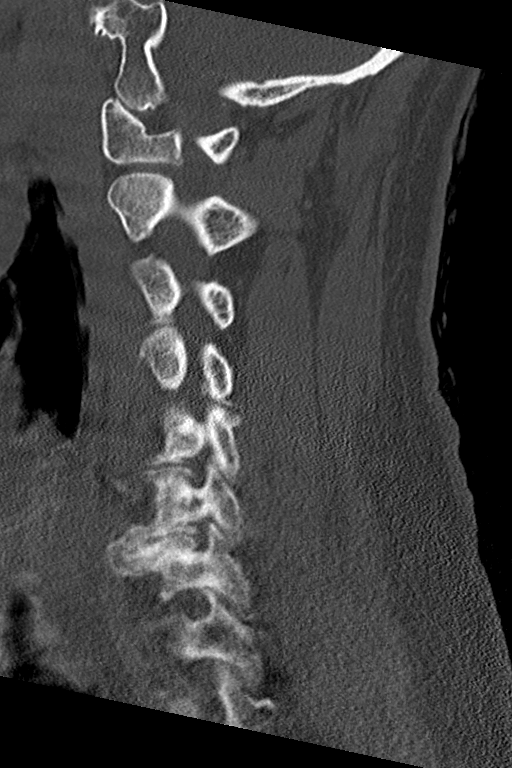
[im 29/58  soft-tissue]
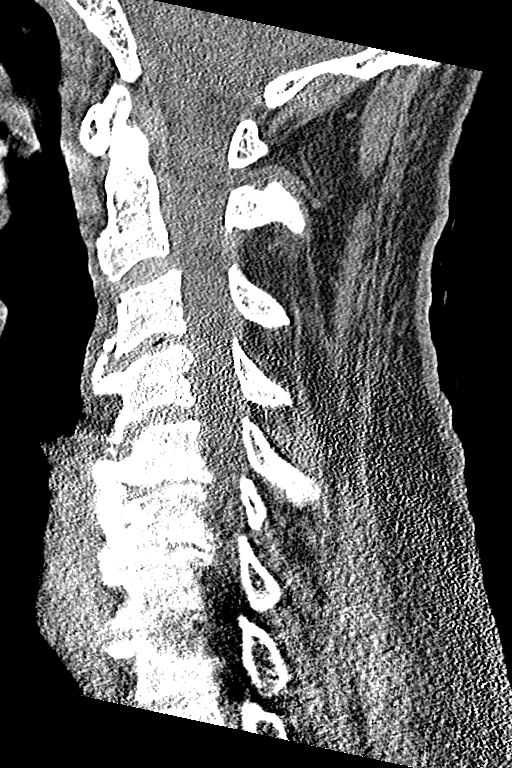
[im 29/58  bone]
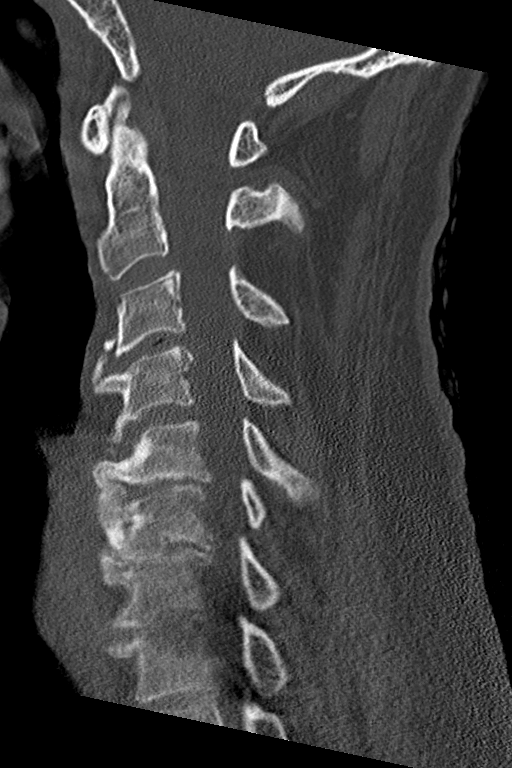
[im 34/58  bone]
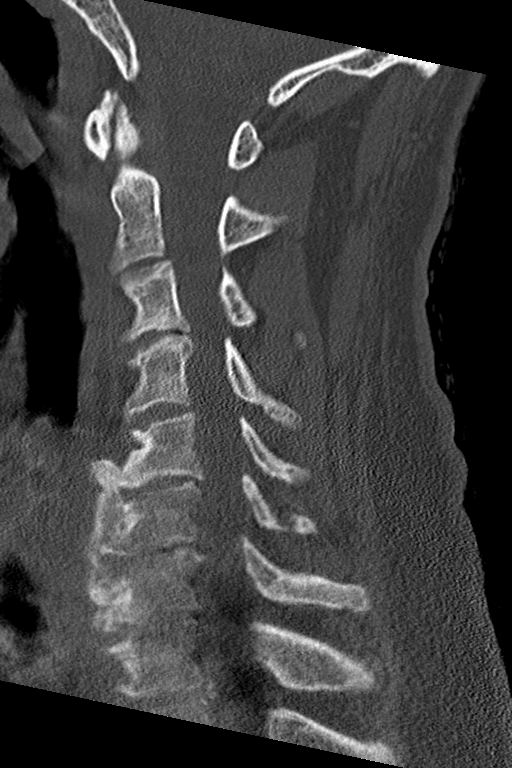
[im 39/58  bone]
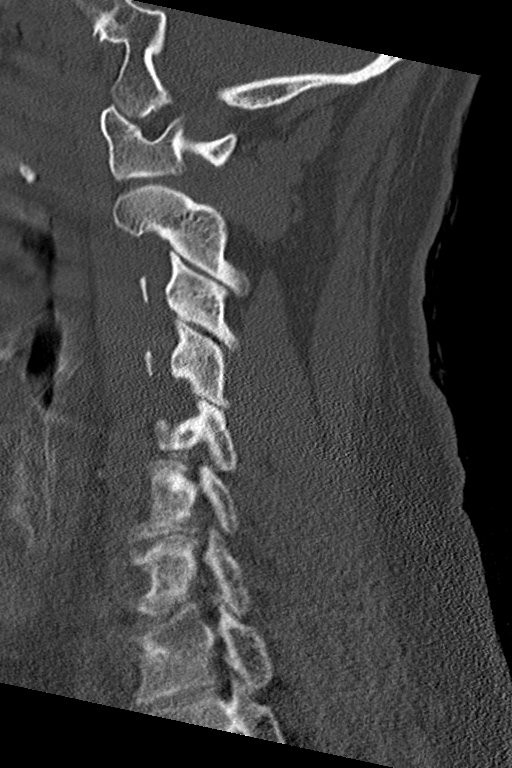

[Series 7: coronal bone · coronal · 0.22mm/px · 3 of 71 slices shown]
[im 15/71  bone]
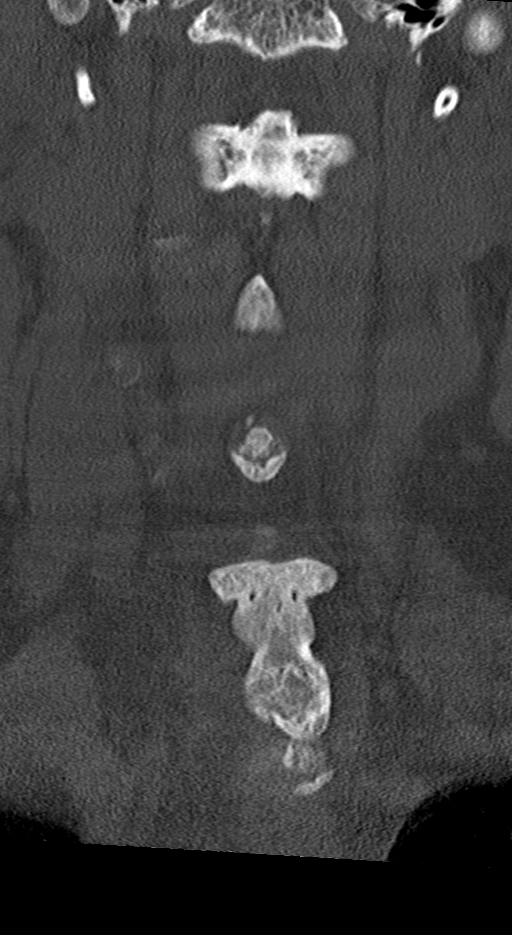
[im 29/71  bone]
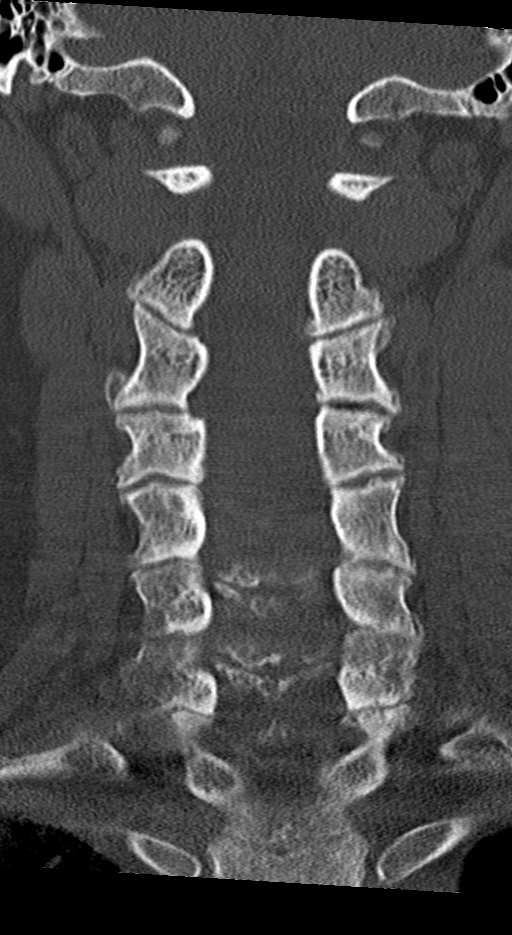
[im 43/71  bone]
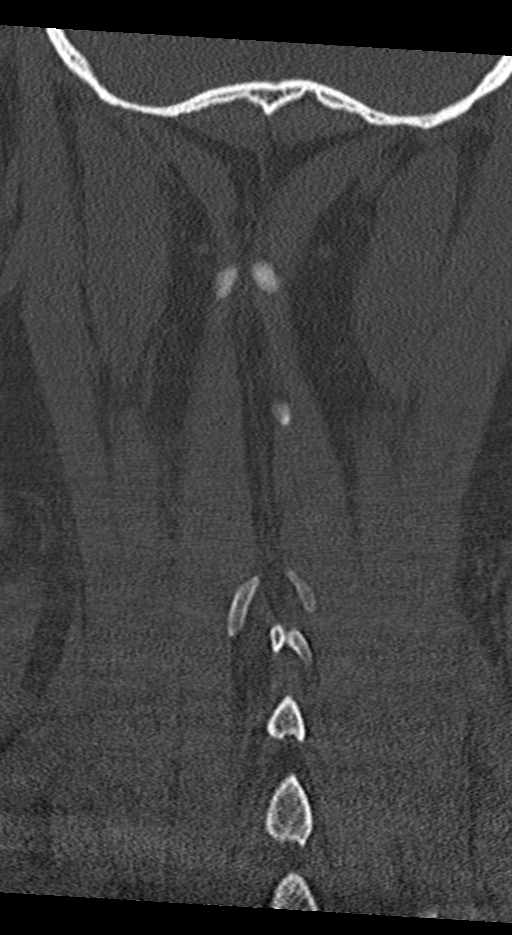

[Series 8: orthogonal bone · axial · 0.22mm/px · z∈[-297,-161]mm · 3 of 106 slices shown, 4 images]
[im 18/106  soft-tissue]
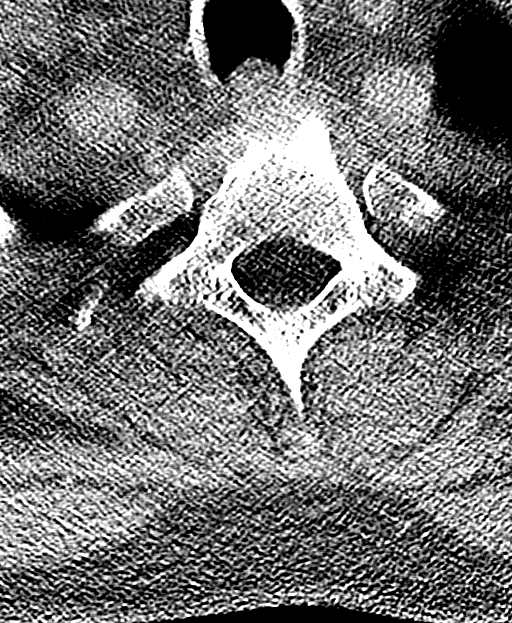
[im 18/106  bone]
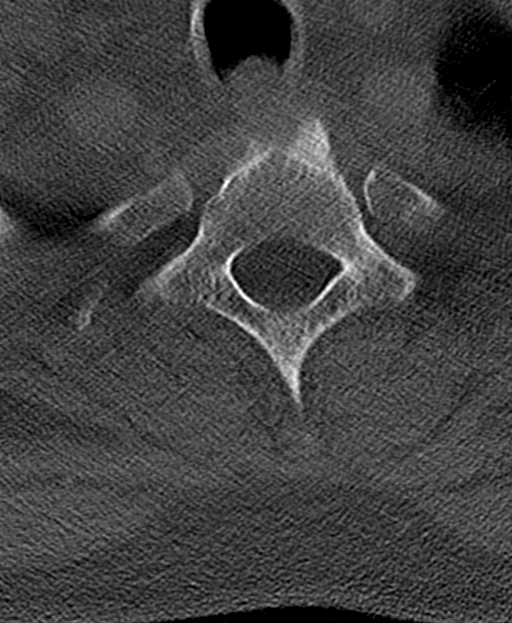
[im 53/106  bone]
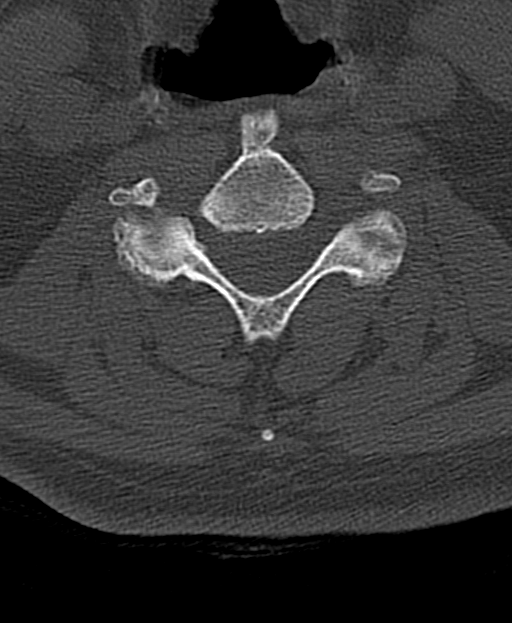
[im 88/106  bone]
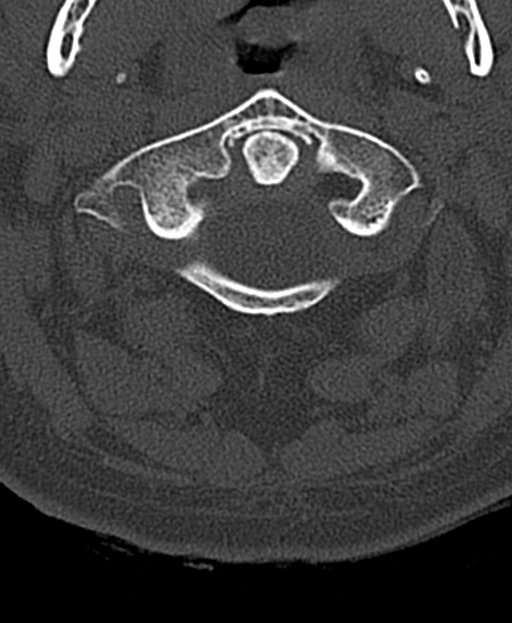

[11 of 33 positions shown; findings below may reference images not displayed]

FINDINGS: Alignment: No malalignment. Straightening of the normal cervical
lordosis.

Skull base and vertebrae: No regional fracture or focal bone lesion.

Soft tissues and spinal canal: No traumatic soft tissue finding.

Disc levels: Facet osteoarthritis on the right at C4-5 and on the
left at C7-T1. Degenerative cervical spondylosis at C5-6 and C6-7
with endplate osteophytes. Mild canal and foraminal narrowing at
those levels.

Upper chest: Negative

Other: None
IMPRESSION: No acute or traumatic finding. Ordinary cervical spondylosis as
above.

## 2020-11-21 IMAGING — DX DG CHEST 1V PORT
1 series · 1 of 1 positions shown · non-contrast
Comparison: Same day.

CLINICAL DATA: Status post intubation.

EXAM:
PORTABLE CHEST 1 VIEW

[chest ap]
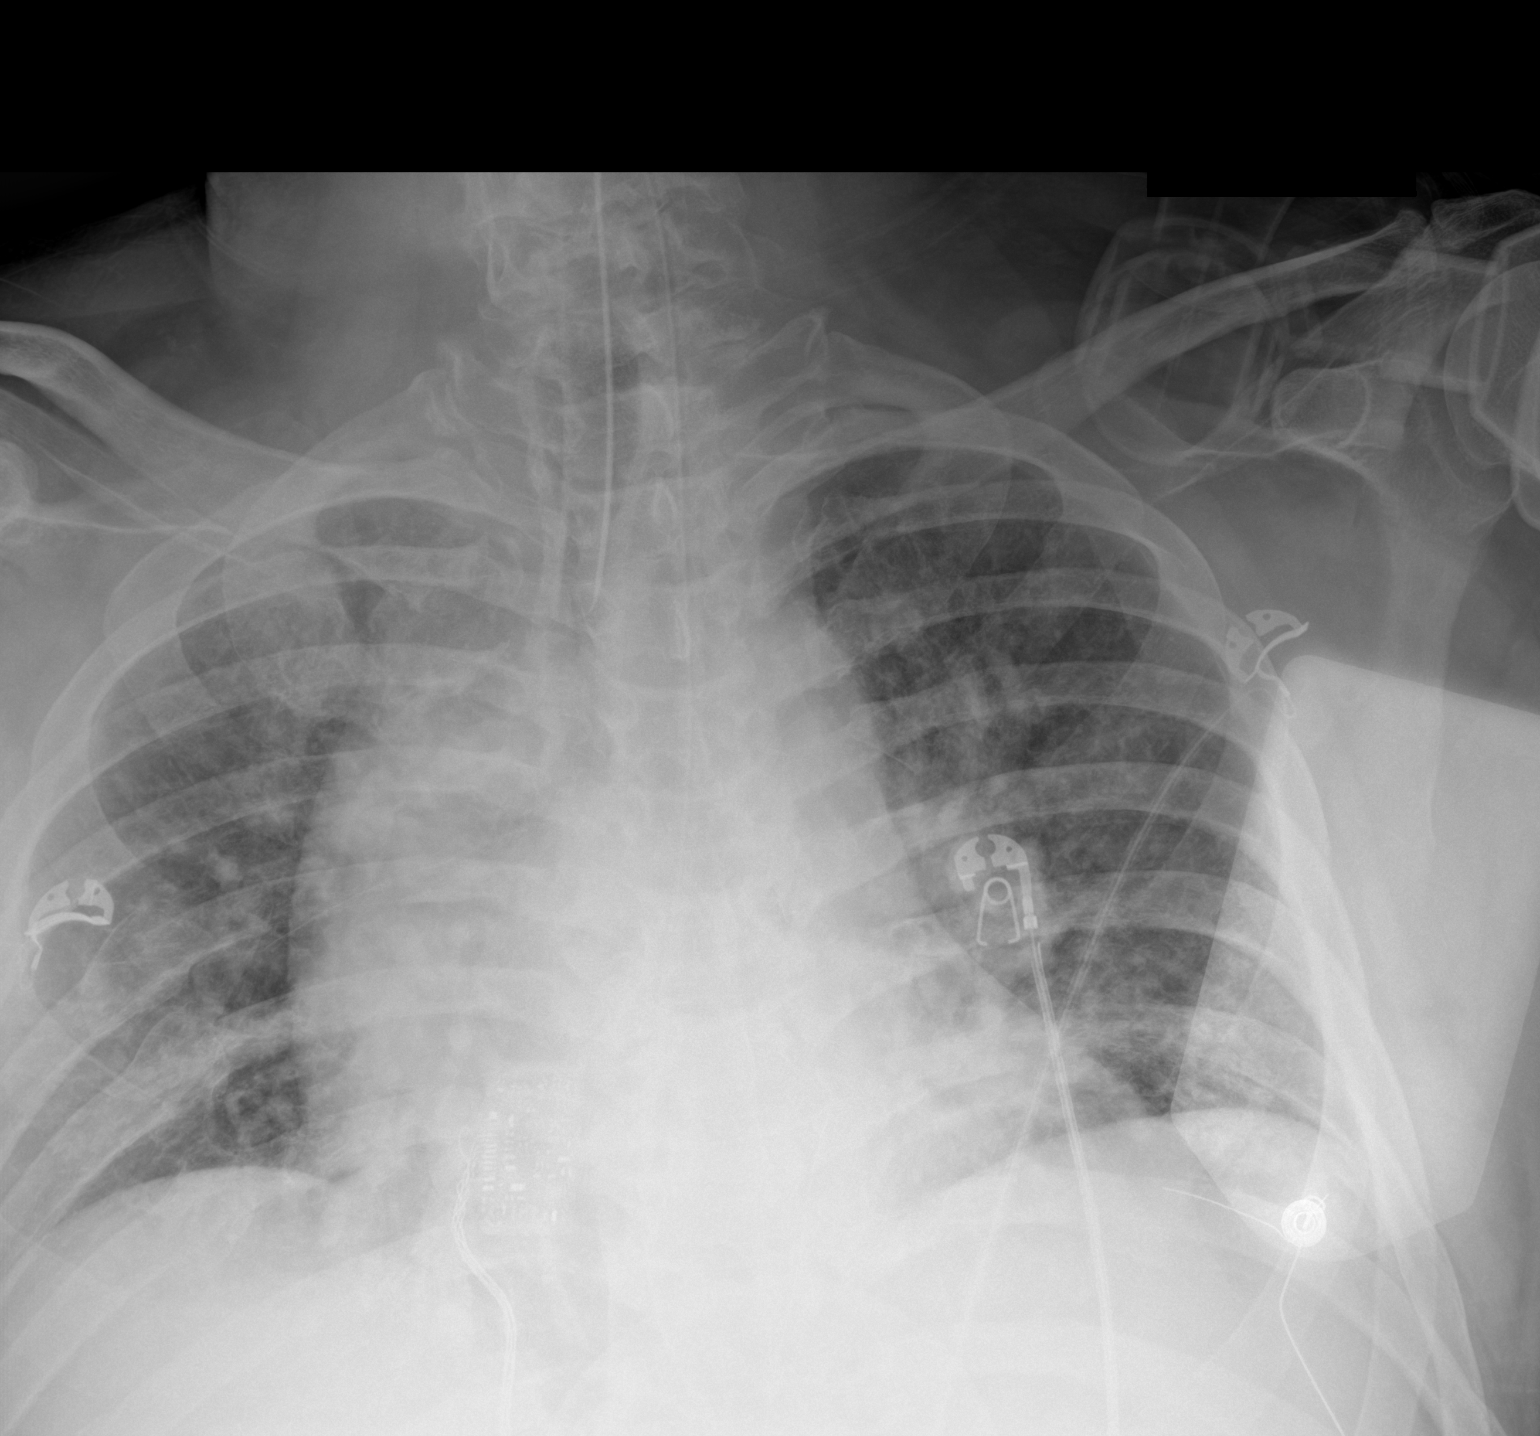

[1 of 1 positions shown; findings below may reference images not displayed]

FINDINGS: Stable cardiomediastinal silhouette. Endotracheal tube is in grossly
good position. Nasogastric tube is seen entering stomach. Old right
rib fractures are noted. Lungs are clear.
IMPRESSION: Endotracheal tube in grossly good position.

## 2020-11-21 IMAGING — CT CT HEAD W/O CM
3 series · 15 of 47 positions shown, 18 images · non-contrast
Comparison: None.

CLINICAL DATA: Delusions, hallucinations

EXAM:
CT HEAD WITHOUT CONTRAST
TECHNIQUE: Contiguous axial images were obtained from the base of the skull
through the vertex without intravenous contrast.

[Series 2: head wo · axial · 0.42mm/px · z∈[-107,+18]mm · 9 of 31 slices shown, 12 images]
[im 3/31  brain]
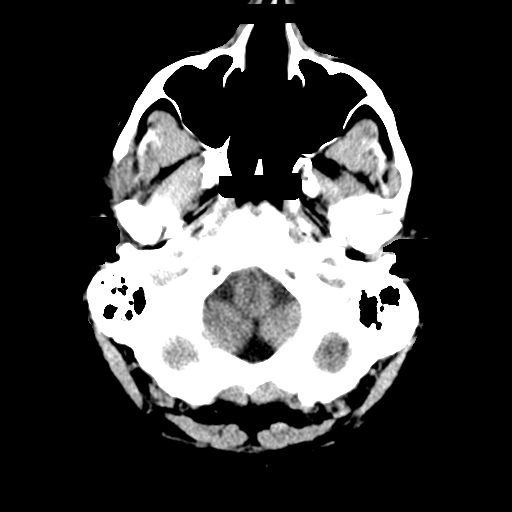
[im 3/31  bone]
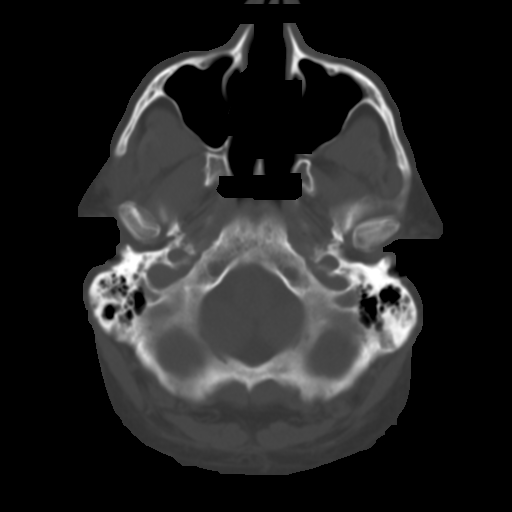
[im 6/31  brain]
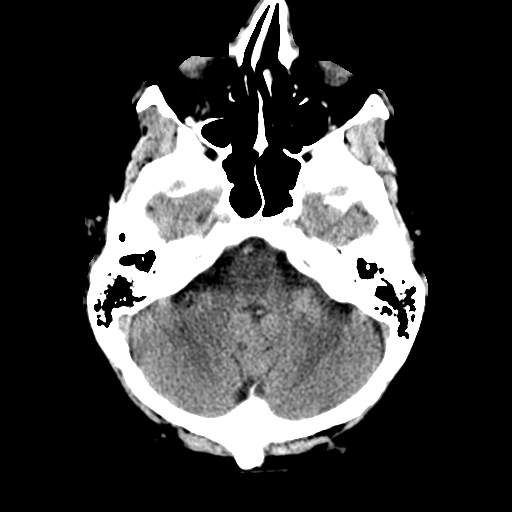
[im 9/31  brain]
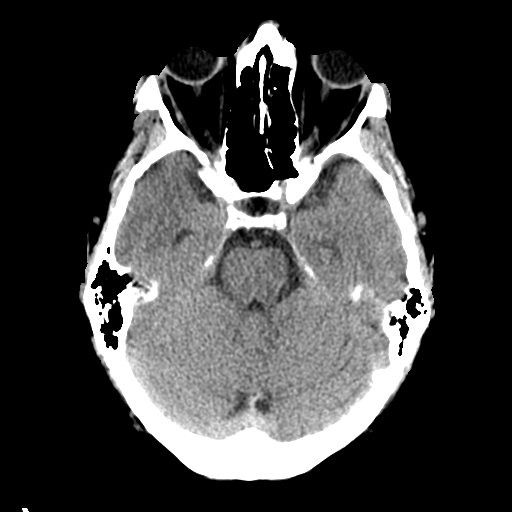
[im 12/31  brain]
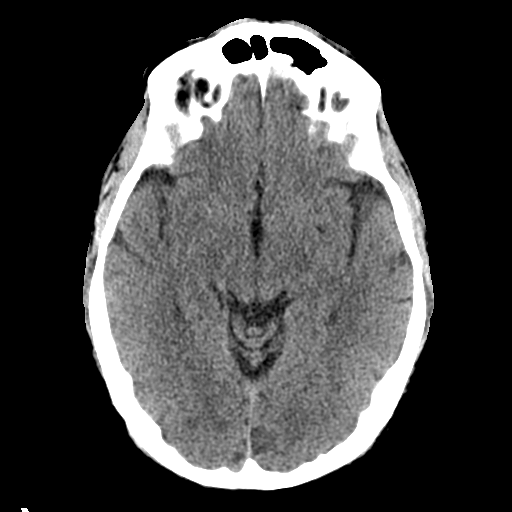
[im 16/31  brain]
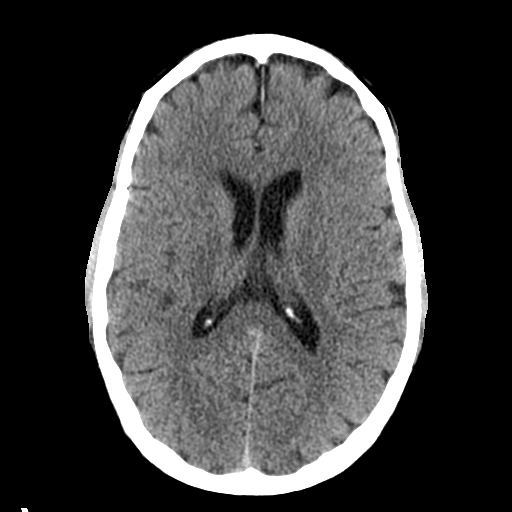
[im 16/31  bone]
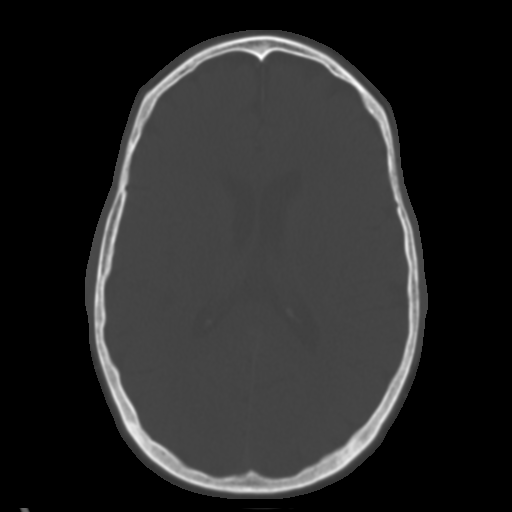
[im 19/31  brain]
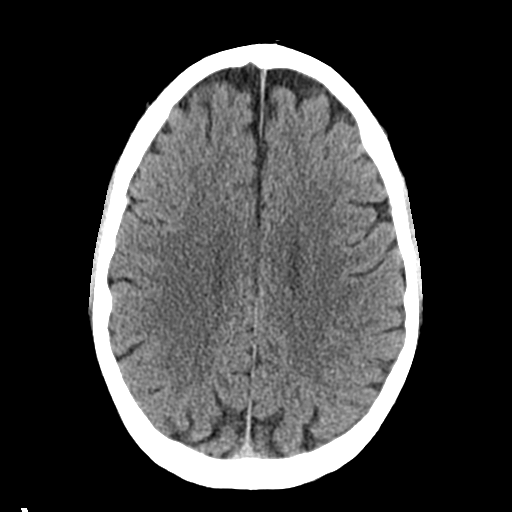
[im 22/31  brain]
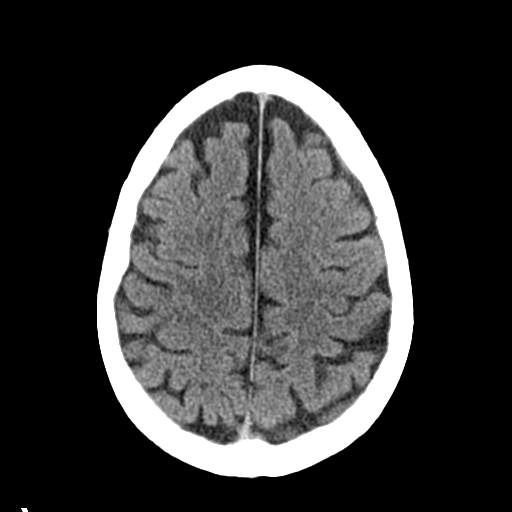
[im 25/31  brain]
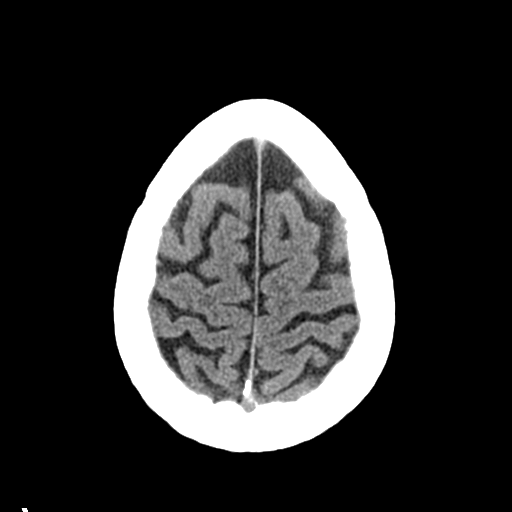
[im 28/31  brain]
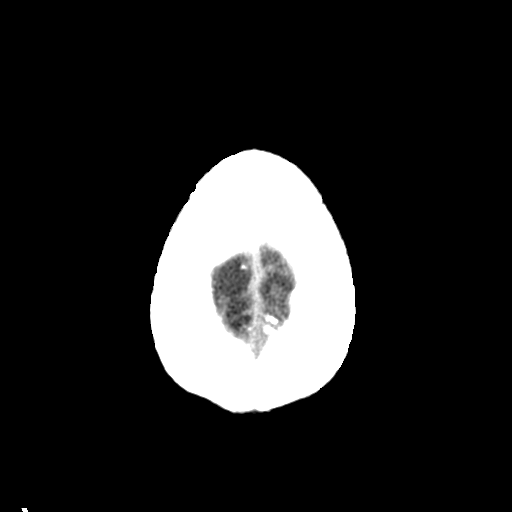
[im 28/31  bone]
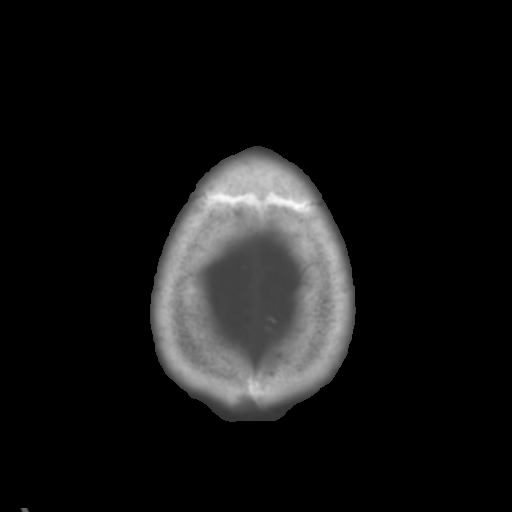

[Series 4: coronal soft tissue · coronal · 0.31mm/px · 3 of 73 slices shown]
[im 26/73  brain]
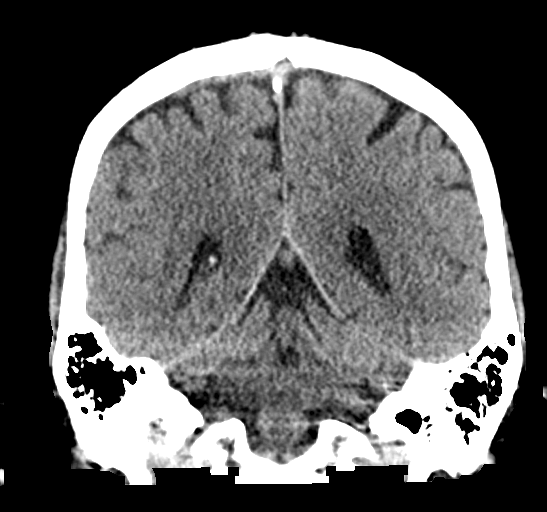
[im 33/73  brain]
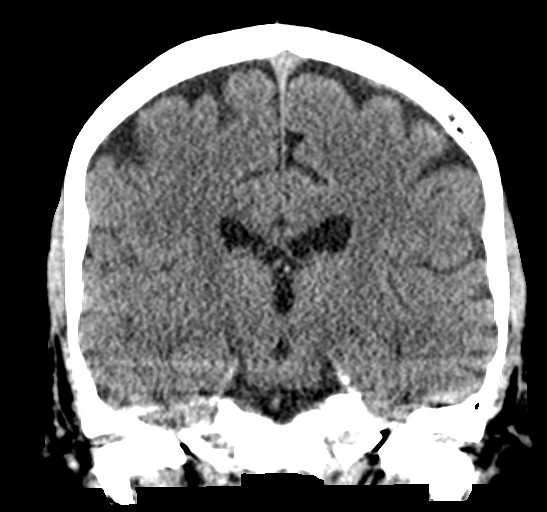
[im 40/73  brain]
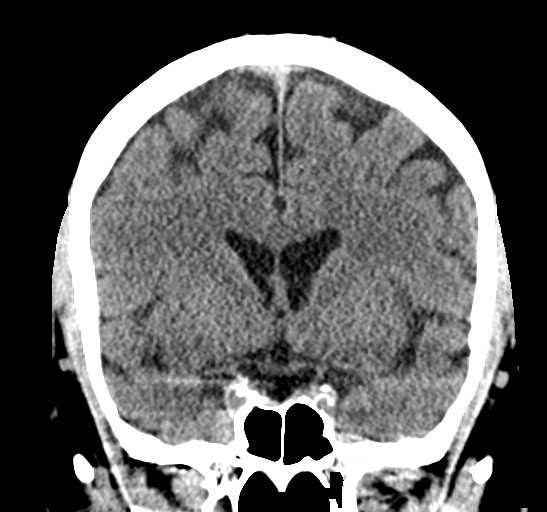

[Series 5: sagittal soft tissue · sagittal · 0.31mm/px · 3 of 61 slices shown]
[im 21/61  brain]
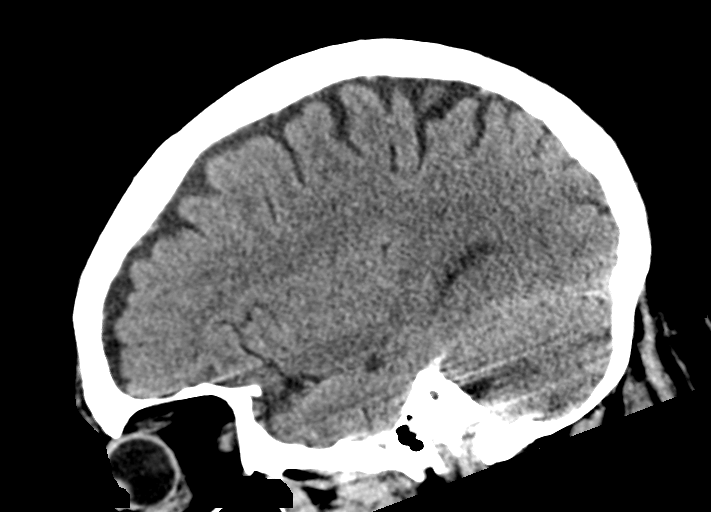
[im 31/61  brain]
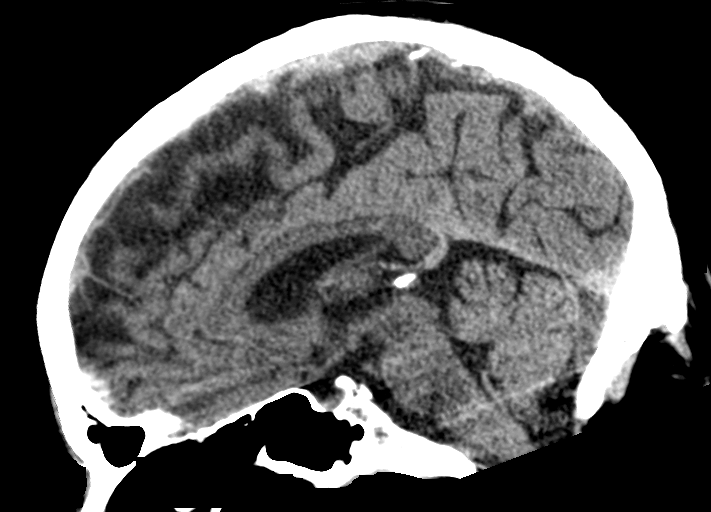
[im 41/61  brain]
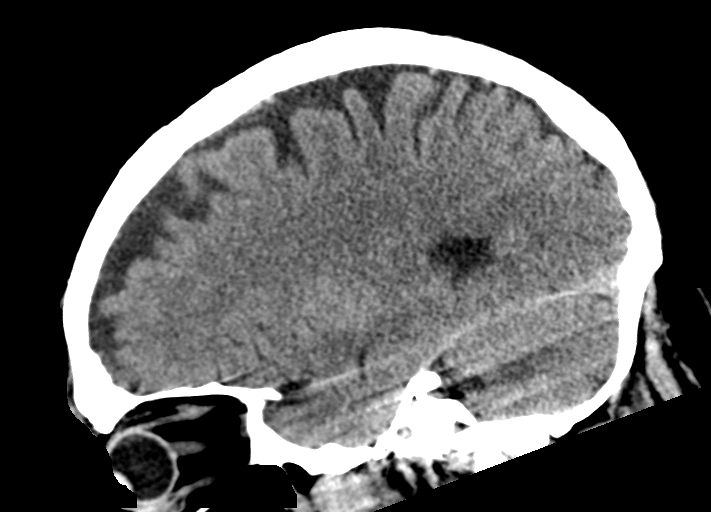

[15 of 47 positions shown; findings below may reference images not displayed]

FINDINGS: Brain: There is no evidence of acute intracranial hemorrhage,
extra-axial fluid collection, or acute infarct. The ventricles are
normal in size. There is no mass lesion. There is no midline shift.

Vascular: No hyperdense vessel or unexpected calcification.

Skull: Normal. Negative for fracture or focal lesion.

Sinuses/Orbits: The imaged paranasal sinuses are clear. The globes
and orbits are unremarkable.

Other: None.
IMPRESSION: Unremarkable head CT with no acute intracranial pathology.

## 2020-11-21 IMAGING — CT CT ABD-PELV W/ CM
2 of 5 series · 13 of 46 positions shown, 15 images · IV contrast (APPLIED)
Comparison: None.

CLINICAL DATA: Shortness of breath with diaphoresis, tachycardia
and elevated D-dimer levels. Clinical concern for pulmonary
embolism. Left upper quadrant abdominal pain for several days.
Patient fell several days ago.

EXAM:
CT ANGIOGRAPHY CHEST
CT ABDOMEN AND PELVIS WITH CONTRAST
TECHNIQUE: Multidetector CT imaging of the chest was performed using the
standard protocol during bolus administration of intravenous
contrast. Multiplanar CT image reconstructions and MIPs were
obtained to evaluate the vascular anatomy. Multidetector CT imaging
of the abdomen and pelvis was performed using the standard protocol
during bolus administration of intravenous contrast.
CONTRAST:  100mL OMNIPAQUE IOHEXOL 350 MG/ML SOLN

[Series 2: axial st · axial · 0.78mm/px · z∈[-908,-362]mm · 10 of 123 slices shown, 12 images]
[im 7/123  soft-tissue]
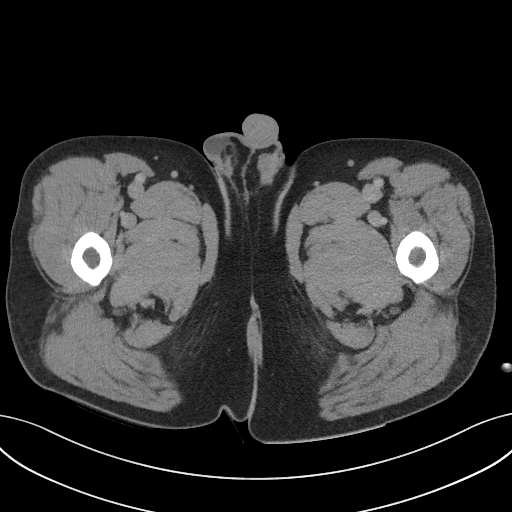
[im 7/123  bone]
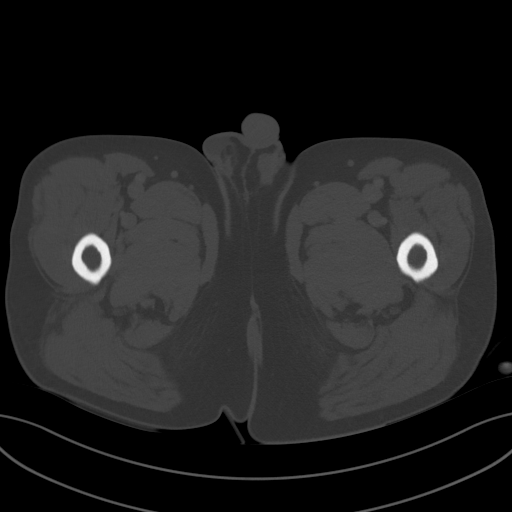
[im 20/123  soft-tissue]
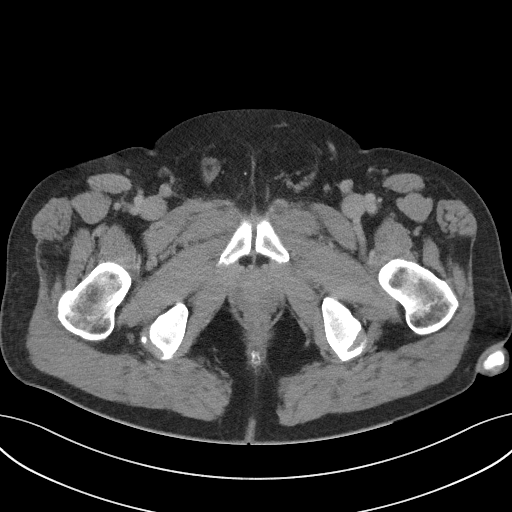
[im 33/123  soft-tissue]
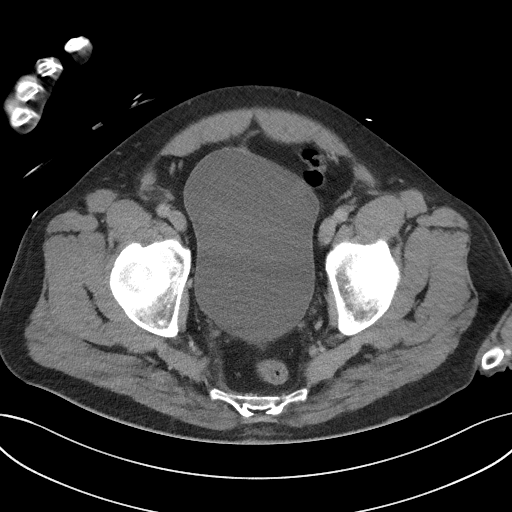
[im 45/123  soft-tissue]
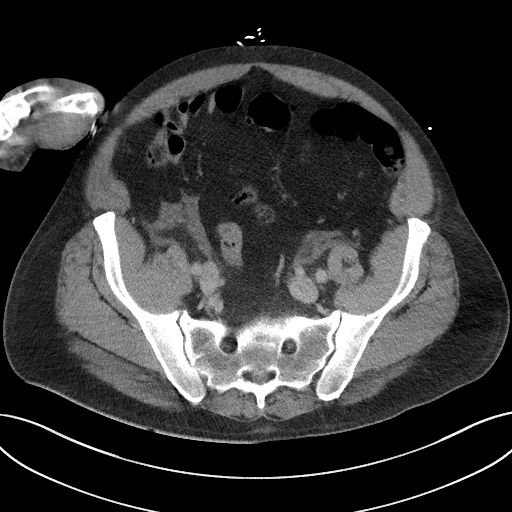
[im 58/123  soft-tissue]
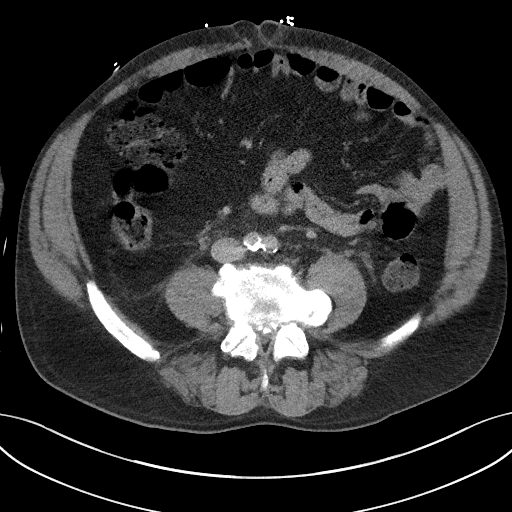
[im 65/123  soft-tissue]
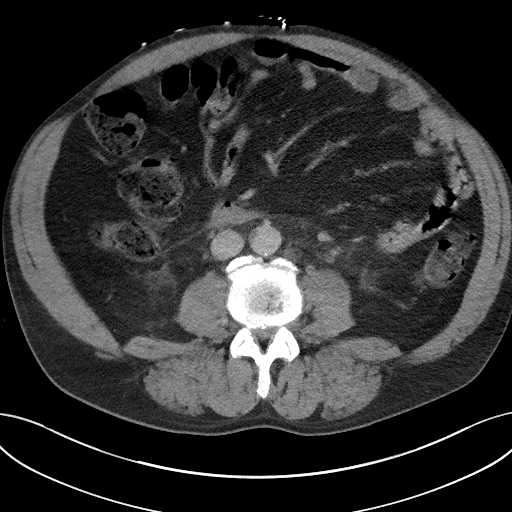
[im 78/123  soft-tissue]
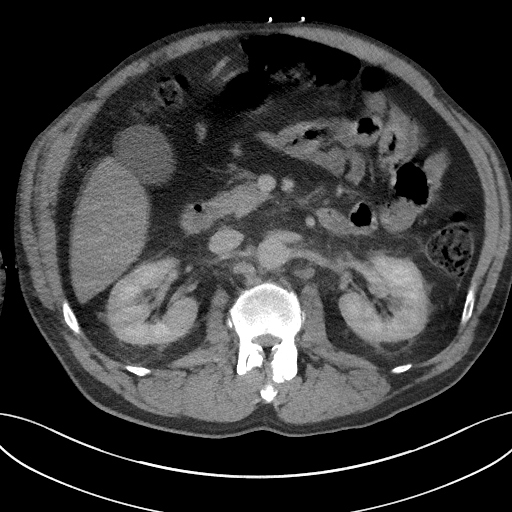
[im 90/123  soft-tissue]
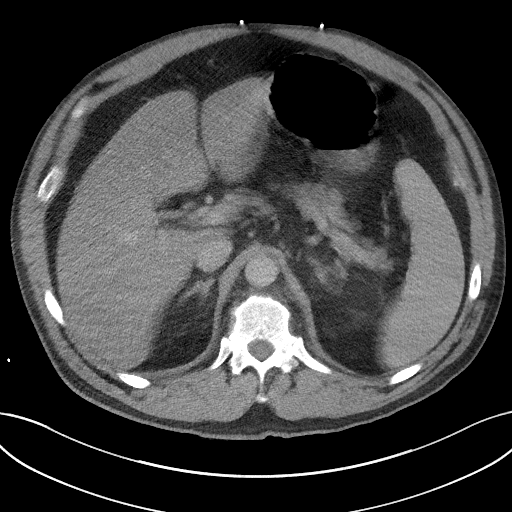
[im 103/123  soft-tissue]
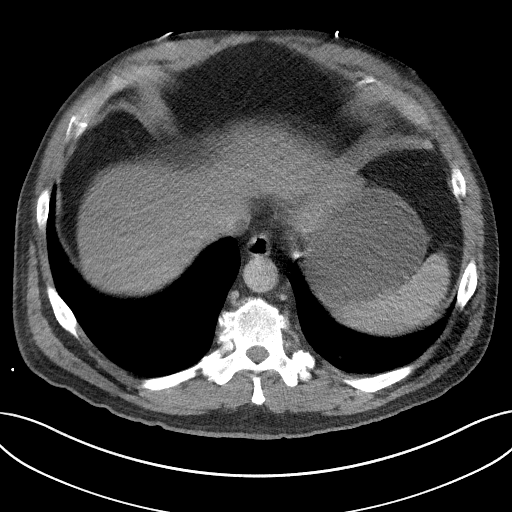
[im 103/123  bone]
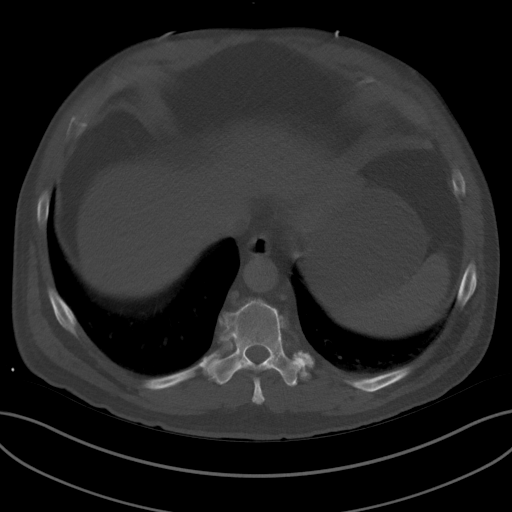
[im 116/123  soft-tissue]
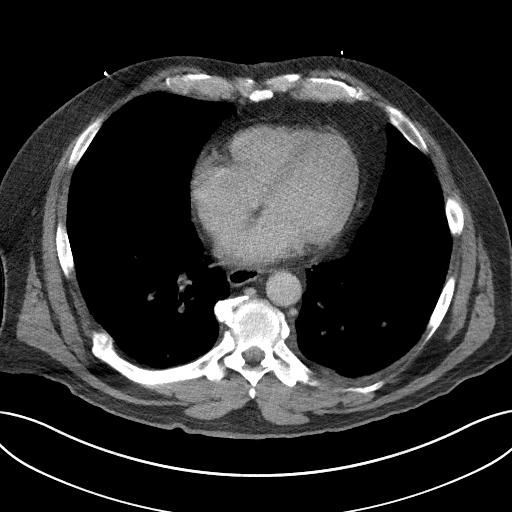

[Series 5: coronal st · coronal · 0.93mm/px · 3 of 110 slices shown]
[im 37/110  soft-tissue]
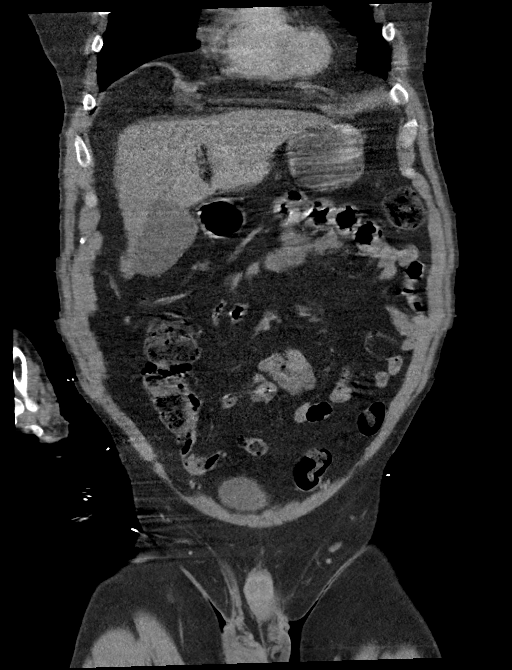
[im 49/110  soft-tissue]
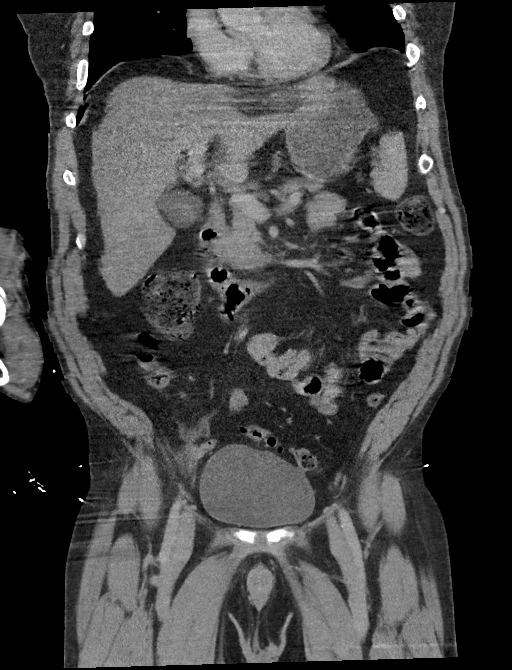
[im 61/110  soft-tissue]
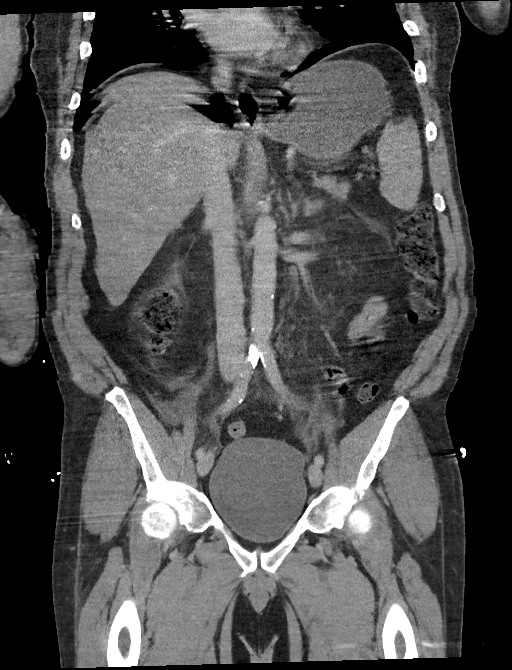

[13 of 46 positions shown; findings below may reference images not displayed]

FINDINGS: Despite efforts by the technologist and patient, mild motion
artifact is present on today's exam and could not be eliminated.
This reduces exam sensitivity and specificity.

CTA CHEST FINDINGS

Cardiovascular: The pulmonary arteries are adequately opacified with
contrast to the level of the subsegmental branches. There is no
evidence of central pulmonary embolism. Evaluation beyond the
segmental level is limited by breathing artifact. No acute systemic
arterial abnormalities are identified. There is mild aortic and
coronary artery atherosclerosis. The heart size is normal. There is
no pericardial effusion.

Mediastinum/Nodes: There are no enlarged mediastinal, hilar or
axillary lymph nodes. There is a small hiatal hernia. The thyroid
gland and trachea appear unremarkable.

Lungs/Pleura: No pleural effusion or pneumothorax. There is moderate
centrilobular and paraseptal emphysema. There are patchy
ground-glass opacities at both lung apices, right greater than left.
There is no confluent airspace opacity or suspicious pulmonary
nodule. There is ill-defined nodularity in the left paraspinal
region at T8-9.

Musculoskeletal/Chest wall: There are multiple old right-sided rib
fractures. No acute osseous findings are seen in the chest. As
above, ill-defined left paraspinal nodularity at T8-9.

CT ABDOMEN AND PELVIS FINDINGS

Hepatobiliary: The hepatic density is decreased, consistent with
steatosis. No focal lesions are identified. No evidence of
gallstones, gallbladder wall thickening or biliary dilatation.

Pancreas: Unremarkable. No pancreatic ductal dilatation or
surrounding inflammatory changes.

Spleen: Normal in size without focal abnormality.

Adrenals/Urinary Tract: Both adrenal glands appear normal. No
evidence of urinary tract calculus, hydronephrosis or delay in
contrast excretion. There is symmetric perinephric soft tissue
stranding bilaterally without focal fluid collection. The urinary
bladder is moderately distended, without wall thickening or
surrounding inflammation.

Stomach/Bowel: No enteric contrast administered. The stomach appears
unremarkable for its degree of distension. No evidence of bowel wall
thickening, distention or surrounding inflammatory change. The
appendix is not clearly visualized, although there is no pericecal
inflammation to suggest appendicitis. Mild sigmoid colon
diverticular changes are present. There is prominent stool
throughout the colon.

Vascular/Lymphatic: There are no enlarged abdominal or pelvic lymph
nodes. Diffuse aortic and branch vessel atherosclerosis without
evidence of aneurysm or large vessel occlusion.

Reproductive: The prostate gland and seminal vesicles appear
unremarkable.

Other: Small umbilical hernia containing only fat. There is a small
amount of ill-defined fluid in both pericolic gutters, right greater
than left. There is no generalized ascites, focal extraluminal fluid
collection or free air.

Musculoskeletal: There is an acute appearing superior endplate
compression fracture at L1 without osseous retropulsion or
involvement of the posterior elements. This results in less than 20%
loss of vertebral body height. No other acute osseous findings are
seen. There are degenerative changes throughout the lumbar spine.

Review of the MIP images confirms the above findings.
IMPRESSION: 1. No evidence of acute pulmonary embolism. Pulmonary arterial
assessment beyond the segmental level is limited by breathing
artifact.
2. Patchy ground-glass opacities at both lung apices may reflect
scarring or atypical inflammation. No consolidation or suspicious
pulmonary nodule.
3. Mild acute superior endplate compression fracture at L1 without
associated osseous retropulsion. This fracture may contribute to
retroperitoneal soft tissue edema which tracks asymmetrically into
the anterior right perinephric space. No evidence of urinary tract
calculus or hydronephrosis. The bladder is mildly distended.
4. Left lower thoracic paraspinal soft tissue nodularity is of
uncertain significance. No well-defined mass is seen, and this could
be postinflammatory, inflammatory or secondary to extra medullary
hematopoiesis. Suggest follow-up chest CT in 6 months.
5. No acute intra-abdominal findings are identified.
6. Incidental findings including hepatic steatosis, Aortic
Atherosclerosis ([CW]-[CW]) and Emphysema ([CW]-[CW]).

## 2020-11-21 IMAGING — CT CT ANGIO CHEST
2 of 6 series · 15 of 46 positions shown · IV contrast (APPLIED)
Comparison: None.

CLINICAL DATA: Shortness of breath with diaphoresis, tachycardia
and elevated D-dimer levels. Clinical concern for pulmonary
embolism. Left upper quadrant abdominal pain for several days.
Patient fell several days ago.

EXAM:
CT ANGIOGRAPHY CHEST
CT ABDOMEN AND PELVIS WITH CONTRAST
TECHNIQUE: Multidetector CT imaging of the chest was performed using the
standard protocol during bolus administration of intravenous
contrast. Multiplanar CT image reconstructions and MIPs were
obtained to evaluate the vascular anatomy. Multidetector CT imaging
of the abdomen and pelvis was performed using the standard protocol
during bolus administration of intravenous contrast.
CONTRAST:  100mL OMNIPAQUE IOHEXOL 350 MG/ML SOLN

[Series 5: thins · axial · 0.79mm/px · z∈[-478,-177]mm · 12 of 331 slices shown]
[im 15/331  lung]
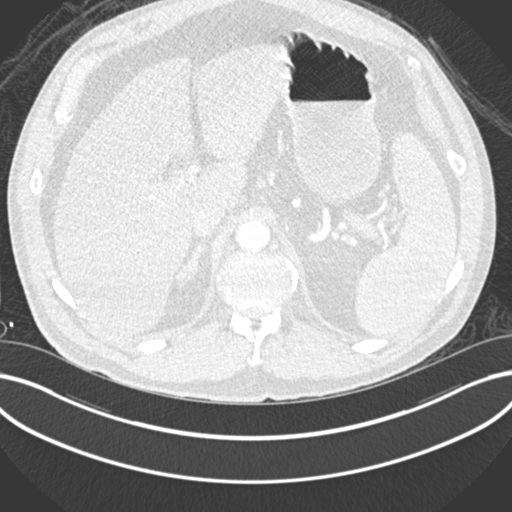
[im 44/331  soft-tissue]
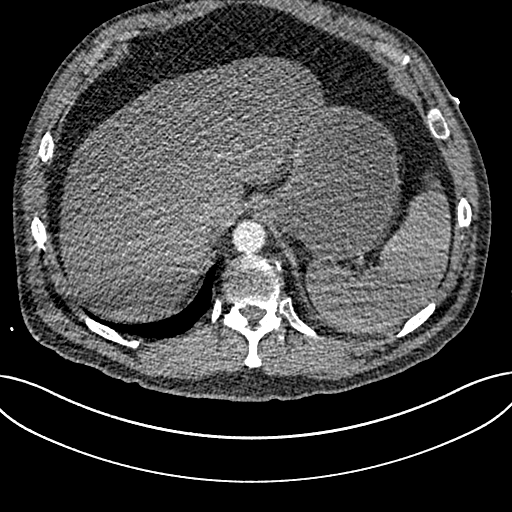
[im 72/331  lung]
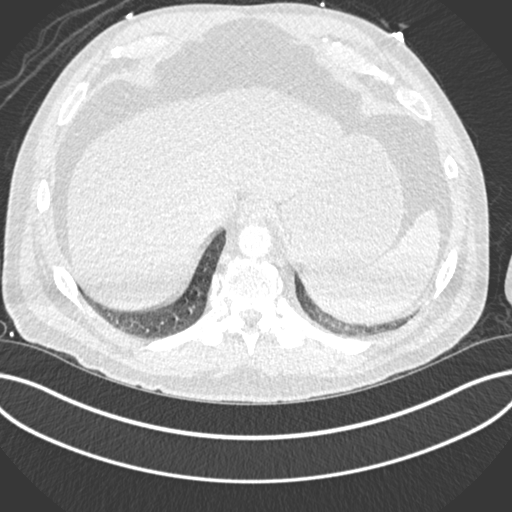
[im 101/331  soft-tissue]
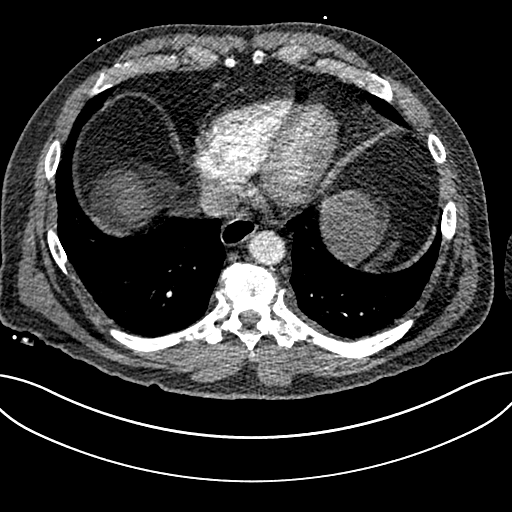
[im 130/331  lung]
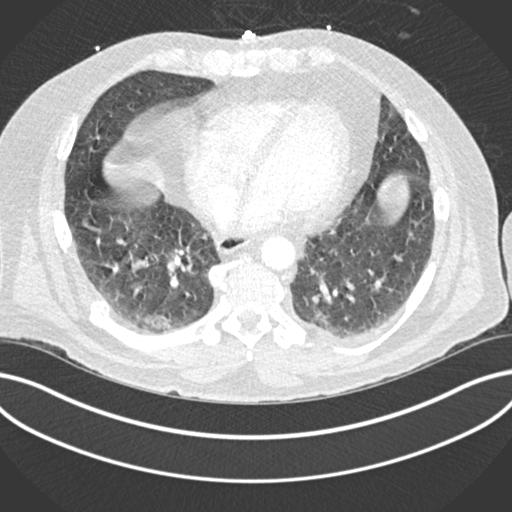
[im 158/331  soft-tissue]
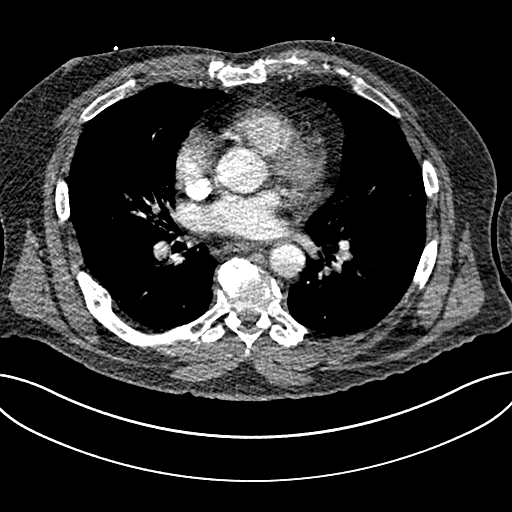
[im 173/331  lung]
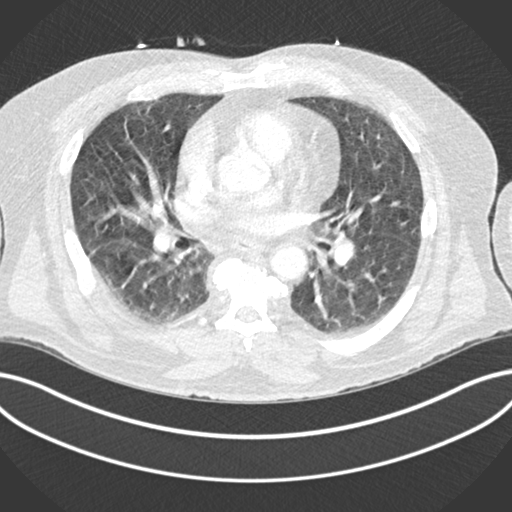
[im 201/331  soft-tissue]
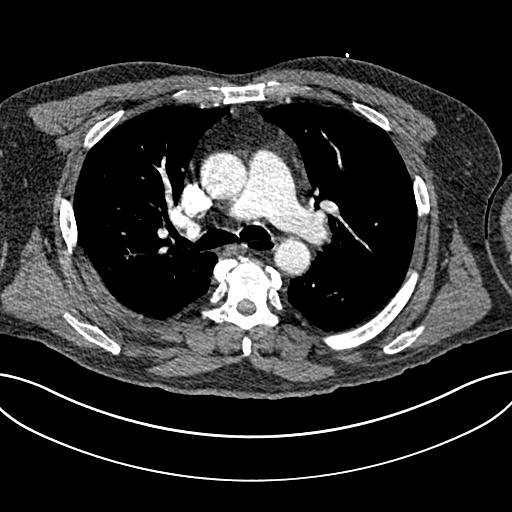
[im 230/331  lung]
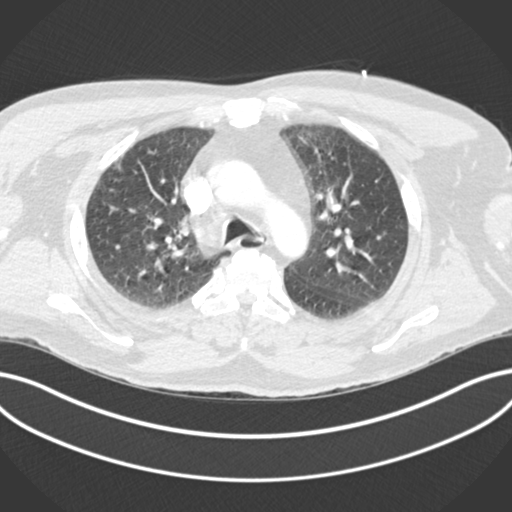
[im 259/331  soft-tissue]
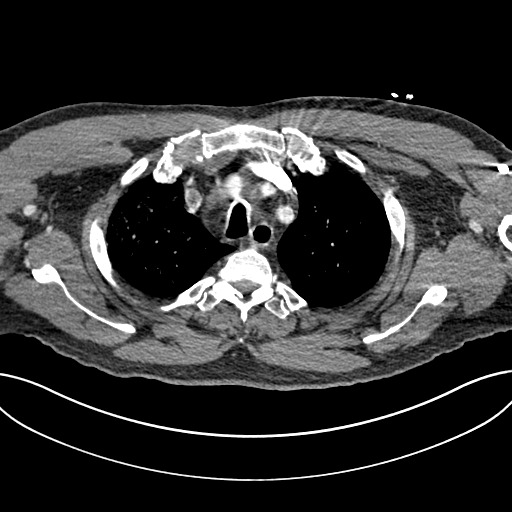
[im 287/331  lung]
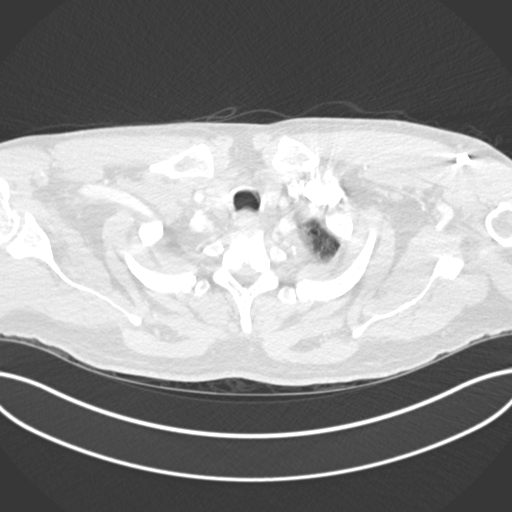
[im 316/331  soft-tissue]
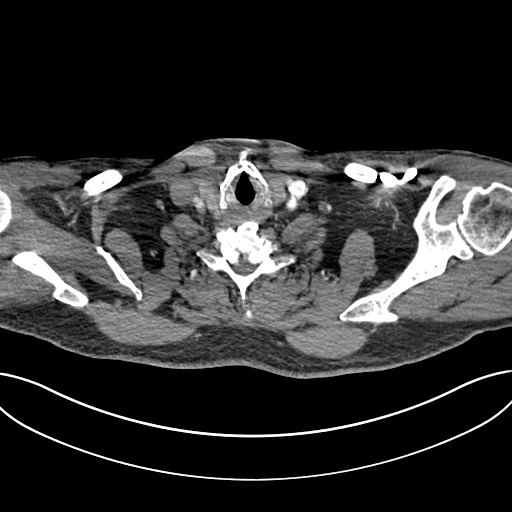

[Series 7: coronal mpr · coronal · 0.68mm/px · 3 of 158 slices shown]
[im 40/158  soft-tissue]
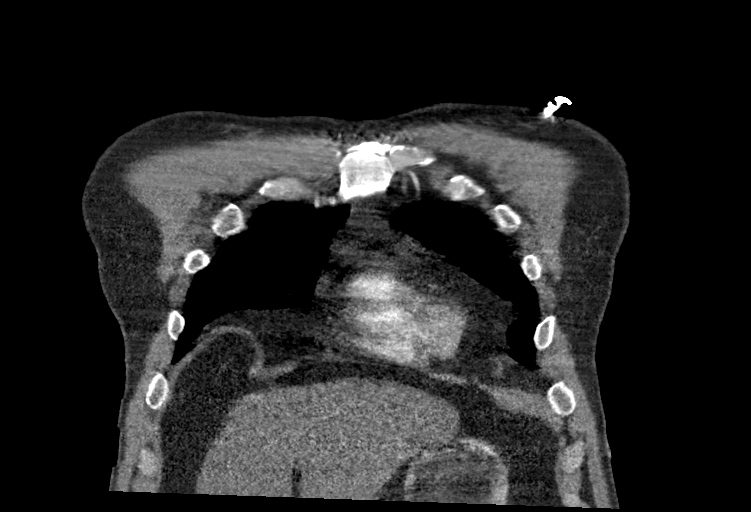
[im 79/158  soft-tissue]
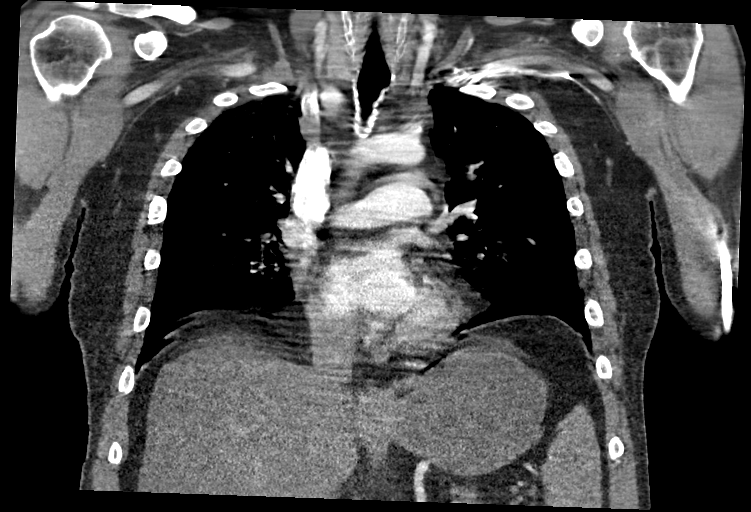
[im 118/158  soft-tissue]
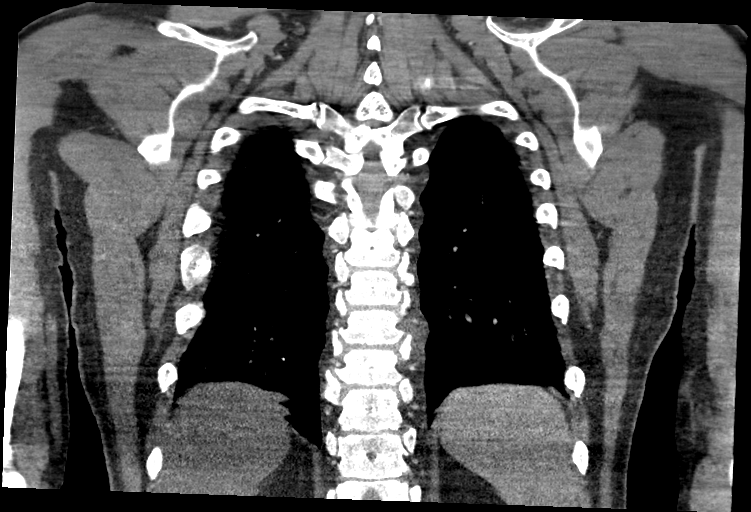

[15 of 46 positions shown; findings below may reference images not displayed]

FINDINGS: Despite efforts by the technologist and patient, mild motion
artifact is present on today's exam and could not be eliminated.
This reduces exam sensitivity and specificity.

CTA CHEST FINDINGS

Cardiovascular: The pulmonary arteries are adequately opacified with
contrast to the level of the subsegmental branches. There is no
evidence of central pulmonary embolism. Evaluation beyond the
segmental level is limited by breathing artifact. No acute systemic
arterial abnormalities are identified. There is mild aortic and
coronary artery atherosclerosis. The heart size is normal. There is
no pericardial effusion.

Mediastinum/Nodes: There are no enlarged mediastinal, hilar or
axillary lymph nodes. There is a small hiatal hernia. The thyroid
gland and trachea appear unremarkable.

Lungs/Pleura: No pleural effusion or pneumothorax. There is moderate
centrilobular and paraseptal emphysema. There are patchy
ground-glass opacities at both lung apices, right greater than left.
There is no confluent airspace opacity or suspicious pulmonary
nodule. There is ill-defined nodularity in the left paraspinal
region at T8-9.

Musculoskeletal/Chest wall: There are multiple old right-sided rib
fractures. No acute osseous findings are seen in the chest. As
above, ill-defined left paraspinal nodularity at T8-9.

CT ABDOMEN AND PELVIS FINDINGS

Hepatobiliary: The hepatic density is decreased, consistent with
steatosis. No focal lesions are identified. No evidence of
gallstones, gallbladder wall thickening or biliary dilatation.

Pancreas: Unremarkable. No pancreatic ductal dilatation or
surrounding inflammatory changes.

Spleen: Normal in size without focal abnormality.

Adrenals/Urinary Tract: Both adrenal glands appear normal. No
evidence of urinary tract calculus, hydronephrosis or delay in
contrast excretion. There is symmetric perinephric soft tissue
stranding bilaterally without focal fluid collection. The urinary
bladder is moderately distended, without wall thickening or
surrounding inflammation.

Stomach/Bowel: No enteric contrast administered. The stomach appears
unremarkable for its degree of distension. No evidence of bowel wall
thickening, distention or surrounding inflammatory change. The
appendix is not clearly visualized, although there is no pericecal
inflammation to suggest appendicitis. Mild sigmoid colon
diverticular changes are present. There is prominent stool
throughout the colon.

Vascular/Lymphatic: There are no enlarged abdominal or pelvic lymph
nodes. Diffuse aortic and branch vessel atherosclerosis without
evidence of aneurysm or large vessel occlusion.

Reproductive: The prostate gland and seminal vesicles appear
unremarkable.

Other: Small umbilical hernia containing only fat. There is a small
amount of ill-defined fluid in both pericolic gutters, right greater
than left. There is no generalized ascites, focal extraluminal fluid
collection or free air.

Musculoskeletal: There is an acute appearing superior endplate
compression fracture at L1 without osseous retropulsion or
involvement of the posterior elements. This results in less than 20%
loss of vertebral body height. No other acute osseous findings are
seen. There are degenerative changes throughout the lumbar spine.

Review of the MIP images confirms the above findings.
IMPRESSION: 1. No evidence of acute pulmonary embolism. Pulmonary arterial
assessment beyond the segmental level is limited by breathing
artifact.
2. Patchy ground-glass opacities at both lung apices may reflect
scarring or atypical inflammation. No consolidation or suspicious
pulmonary nodule.
3. Mild acute superior endplate compression fracture at L1 without
associated osseous retropulsion. This fracture may contribute to
retroperitoneal soft tissue edema which tracks asymmetrically into
the anterior right perinephric space. No evidence of urinary tract
calculus or hydronephrosis. The bladder is mildly distended.
4. Left lower thoracic paraspinal soft tissue nodularity is of
uncertain significance. No well-defined mass is seen, and this could
be postinflammatory, inflammatory or secondary to extra medullary
hematopoiesis. Suggest follow-up chest CT in 6 months.
5. No acute intra-abdominal findings are identified.
6. Incidental findings including hepatic steatosis, Aortic
Atherosclerosis ([CW]-[CW]) and Emphysema ([CW]-[CW]).

## 2020-11-21 MED ORDER — POLYETHYLENE GLYCOL 3350 17 G PO PACK: 17.0000 g | PACK | Freq: Every day | ORAL | Status: DC | PRN

## 2020-11-21 MED ORDER — DEXAMETHASONE SODIUM PHOSPHATE 10 MG/ML IJ SOLN
10.0000 mg | Freq: Once | INTRAMUSCULAR | Status: AC
  Administered 2020-11-21: 10 mg via INTRAVENOUS
  Filled 2020-11-21: qty 1

## 2020-11-21 MED ORDER — ACETAMINOPHEN 10 MG/ML IV SOLN
1000.0000 mg | Freq: Four times a day (QID) | INTRAVENOUS | Status: DC
  Administered 2020-11-21: 1000 mg via INTRAVENOUS
  Filled 2020-11-21 (×4): qty 100

## 2020-11-21 MED ORDER — ROCURONIUM BROMIDE 10 MG/ML (PF) SYRINGE
PREFILLED_SYRINGE | INTRAVENOUS | Status: AC
  Filled 2020-11-21: qty 10

## 2020-11-21 MED ORDER — CHLORHEXIDINE GLUCONATE CLOTH 2 % EX PADS
6.0000 | MEDICATED_PAD | Freq: Every day | CUTANEOUS | Status: DC
  Administered 2020-11-21 – 2020-12-07 (×17): 6 via TOPICAL

## 2020-11-21 MED ORDER — LORAZEPAM 2 MG/ML IJ SOLN
2.0000 mg | Freq: Once | INTRAMUSCULAR | Status: AC
  Administered 2020-11-21: 2 mg via INTRAVENOUS
  Filled 2020-11-21: qty 1

## 2020-11-21 MED ORDER — MIDAZOLAM HCL 5 MG/5ML IJ SOLN
INTRAMUSCULAR | Status: AC
  Administered 2020-11-21: 5 mg via INTRAVENOUS
  Filled 2020-11-21: qty 5

## 2020-11-21 MED ORDER — DOCUSATE SODIUM 50 MG/5ML PO LIQD
100.0000 mg | Freq: Two times a day (BID) | ORAL | Status: DC | PRN
  Filled 2020-11-21: qty 10

## 2020-11-21 MED ORDER — MIDAZOLAM HCL 5 MG/5ML IJ SOLN
5.0000 mg | Freq: Once | INTRAMUSCULAR | Status: AC
  Filled 2020-11-21: qty 5

## 2020-11-21 MED ORDER — VANCOMYCIN HCL IN DEXTROSE 1-5 GM/200ML-% IV SOLN
1000.0000 mg | Freq: Once | INTRAVENOUS | Status: AC
  Administered 2020-11-21: 1000 mg via INTRAVENOUS
  Filled 2020-11-21: qty 200

## 2020-11-21 MED ORDER — IOHEXOL 350 MG/ML SOLN
100.0000 mL | Freq: Once | INTRAVENOUS | Status: AC | PRN
  Administered 2020-11-21: 100 mL via INTRAVENOUS

## 2020-11-21 MED ORDER — LORAZEPAM 2 MG/ML IJ SOLN
INTRAMUSCULAR | Status: AC
  Filled 2020-11-21: qty 1

## 2020-11-21 MED ORDER — LORAZEPAM 2 MG/ML IJ SOLN
INTRAMUSCULAR | Status: AC
  Administered 2020-11-21: 2 mg
  Filled 2020-11-21: qty 1

## 2020-11-21 MED ORDER — ETOMIDATE 2 MG/ML IV SOLN
INTRAVENOUS | Status: AC
  Filled 2020-11-21: qty 20

## 2020-11-21 MED ORDER — IPRATROPIUM-ALBUTEROL 0.5-2.5 (3) MG/3ML IN SOLN
3.0000 mL | Freq: Four times a day (QID) | RESPIRATORY_TRACT | Status: DC
  Administered 2020-11-21 – 2020-12-08 (×65): 3 mL via RESPIRATORY_TRACT
  Filled 2020-11-21 (×64): qty 3

## 2020-11-21 MED ORDER — MIDAZOLAM HCL 5 MG/5ML IJ SOLN: 5.0000 mg | Freq: Once | INTRAMUSCULAR | Status: AC

## 2020-11-21 MED ORDER — PROPOFOL 1000 MG/100ML IV EMUL
5.0000 ug/kg/min | INTRAVENOUS | Status: DC
  Administered 2020-11-21 (×2): 60 ug/kg/min via INTRAVENOUS
  Administered 2020-11-22: 30 ug/kg/min via INTRAVENOUS
  Administered 2020-11-22: 40 ug/kg/min via INTRAVENOUS
  Administered 2020-11-22: 60 ug/kg/min via INTRAVENOUS
  Administered 2020-11-22: 50 ug/kg/min via INTRAVENOUS
  Administered 2020-11-22 (×2): 40 ug/kg/min via INTRAVENOUS
  Administered 2020-11-23: 30 ug/kg/min via INTRAVENOUS
  Administered 2020-11-23: 8 ug/kg/min via INTRAVENOUS
  Administered 2020-11-23: 15 ug/kg/min via INTRAVENOUS
  Administered 2020-11-25: 10 ug/kg/min via INTRAVENOUS
  Administered 2020-11-25: 8 ug/kg/min via INTRAVENOUS
  Administered 2020-11-26: 15 ug/kg/min via INTRAVENOUS
  Administered 2020-11-26: 5 ug/kg/min via INTRAVENOUS
  Administered 2020-11-26: 10 ug/kg/min via INTRAVENOUS
  Administered 2020-11-27 (×2): 80 ug/kg/min via INTRAVENOUS
  Administered 2020-11-27 (×2): 45 ug/kg/min via INTRAVENOUS
  Administered 2020-11-27: 40 ug/kg/min via INTRAVENOUS
  Administered 2020-11-27: 15 ug/kg/min via INTRAVENOUS
  Administered 2020-11-27: 80 ug/kg/min via INTRAVENOUS
  Administered 2020-11-27: 50 ug/kg/min via INTRAVENOUS
  Administered 2020-11-28 – 2020-11-29 (×15): 80 ug/kg/min via INTRAVENOUS
  Administered 2020-11-29 (×2): 60 ug/kg/min via INTRAVENOUS
  Administered 2020-11-29: 70 ug/kg/min via INTRAVENOUS
  Filled 2020-11-21 (×6): qty 100
  Filled 2020-11-21: qty 200
  Filled 2020-11-21 (×11): qty 100
  Filled 2020-11-21: qty 200
  Filled 2020-11-21 (×4): qty 100
  Filled 2020-11-21: qty 200
  Filled 2020-11-21 (×7): qty 100
  Filled 2020-11-21: qty 200
  Filled 2020-11-21 (×10): qty 100

## 2020-11-21 MED ORDER — CHLORHEXIDINE GLUCONATE 0.12% ORAL RINSE (MEDLINE KIT)
15.0000 mL | Freq: Two times a day (BID) | OROMUCOSAL | Status: DC
  Administered 2020-11-21 – 2020-12-06 (×30): 15 mL via OROMUCOSAL

## 2020-11-21 MED ORDER — LACTATED RINGERS IV BOLUS
1000.0000 mL | Freq: Once | INTRAVENOUS | Status: AC
  Administered 2020-11-21: 1000 mL via INTRAVENOUS

## 2020-11-21 MED ORDER — PANTOPRAZOLE SODIUM 40 MG IV SOLR
40.0000 mg | Freq: Every day | INTRAVENOUS | Status: DC
  Administered 2020-11-21 – 2020-12-05 (×15): 40 mg via INTRAVENOUS
  Filled 2020-11-21 (×16): qty 40

## 2020-11-21 MED ORDER — DOCUSATE SODIUM 50 MG/5ML PO LIQD
100.0000 mg | Freq: Two times a day (BID) | ORAL | Status: DC
  Administered 2020-11-21 – 2020-12-04 (×20): 100 mg
  Filled 2020-11-21 (×22): qty 10

## 2020-11-21 MED ORDER — LORAZEPAM 2 MG/ML IJ SOLN
1.0000 mg | Freq: Once | INTRAMUSCULAR | Status: AC
  Administered 2020-11-21: 1 mg via INTRAVENOUS
  Filled 2020-11-21: qty 1

## 2020-11-21 MED ORDER — POLYETHYLENE GLYCOL 3350 17 G PO PACK
17.0000 g | PACK | Freq: Every day | ORAL | Status: DC
Start: 2020-11-21 — End: 2020-12-04
  Administered 2020-11-22 – 2020-12-04 (×9): 17 g
  Filled 2020-11-21 (×10): qty 1

## 2020-11-21 MED ORDER — FENTANYL CITRATE PF 50 MCG/ML IJ SOSY
50.0000 ug | PREFILLED_SYRINGE | Freq: Once | INTRAMUSCULAR | Status: AC
Start: 2020-11-21 — End: 2020-11-21
  Administered 2020-11-21: 50 ug via INTRAVENOUS
  Filled 2020-11-21: qty 1

## 2020-11-21 MED ORDER — POTASSIUM CHLORIDE 10 MEQ/100ML IV SOLN
10.0000 meq | INTRAVENOUS | Status: AC
Start: 2020-11-21 — End: 2020-11-21

## 2020-11-21 MED ORDER — SUCCINYLCHOLINE CHLORIDE 200 MG/10ML IV SOSY
PREFILLED_SYRINGE | INTRAVENOUS | Status: AC
  Administered 2020-11-21: 100 mg
  Filled 2020-11-21: qty 10

## 2020-11-21 MED ORDER — MIDAZOLAM HCL 2 MG/2ML IJ SOLN
INTRAMUSCULAR | Status: AC
  Filled 2020-11-21: qty 2

## 2020-11-21 MED ORDER — PROPOFOL 1000 MG/100ML IV EMUL
INTRAVENOUS | Status: AC
  Administered 2020-11-21: 40 ug/kg/min via INTRAVENOUS
  Filled 2020-11-21: qty 100

## 2020-11-21 MED ORDER — MIDAZOLAM-SODIUM CHLORIDE 100-0.9 MG/100ML-% IV SOLN
0.5000 mg/h | INTRAVENOUS | Status: DC
  Administered 2020-11-21: 1 mg/h via INTRAVENOUS
  Administered 2020-11-22 – 2020-11-25 (×9): 10 mg/h via INTRAVENOUS
  Administered 2020-11-25: 9.5 mg/h via INTRAVENOUS
  Filled 2020-11-21 (×10): qty 100

## 2020-11-21 MED ORDER — PROPOFOL 10 MG/ML IV BOLUS
INTRAVENOUS | Status: AC
  Filled 2020-11-21: qty 20

## 2020-11-21 MED ORDER — ACETAMINOPHEN 325 MG PO TABS
650.0000 mg | ORAL_TABLET | Freq: Four times a day (QID) | ORAL | Status: DC | PRN
  Administered 2020-11-22 – 2020-11-29 (×9): 650 mg
  Filled 2020-11-21 (×9): qty 2

## 2020-11-21 MED ORDER — FENTANYL 2500MCG IN NS 250ML (10MCG/ML) PREMIX INFUSION
0.0000 ug/h | INTRAVENOUS | Status: DC
  Administered 2020-11-21: 50 ug/h via INTRAVENOUS
  Administered 2020-11-22: 150 ug/h via INTRAVENOUS
  Administered 2020-11-23: 175 ug/h via INTRAVENOUS
  Administered 2020-11-24: 150 ug/h via INTRAVENOUS
  Administered 2020-11-24: 175 ug/h via INTRAVENOUS
  Administered 2020-11-25 – 2020-11-26 (×3): 150 ug/h via INTRAVENOUS
  Administered 2020-11-27: 180 ug/h via INTRAVENOUS
  Administered 2020-11-27: 125 ug/h via INTRAVENOUS
  Administered 2020-11-28 (×2): 200 ug/h via INTRAVENOUS
  Administered 2020-11-29: 400 ug/h via INTRAVENOUS
  Administered 2020-11-29: 150 ug/h via INTRAVENOUS
  Administered 2020-11-30 – 2020-12-01 (×6): 400 ug/h via INTRAVENOUS
  Filled 2020-11-21 (×19): qty 250

## 2020-11-21 MED ORDER — MIDAZOLAM HCL 2 MG/2ML IJ SOLN
2.0000 mg | INTRAMUSCULAR | Status: DC | PRN
  Administered 2020-11-25: 2 mg via INTRAVENOUS
  Filled 2020-11-21: qty 2

## 2020-11-21 MED ORDER — MIDAZOLAM HCL 2 MG/2ML IJ SOLN
2.0000 mg | INTRAMUSCULAR | Status: AC | PRN
  Administered 2020-11-26 (×3): 2 mg via INTRAVENOUS
  Filled 2020-11-21 (×4): qty 2

## 2020-11-21 MED ORDER — HEPARIN SODIUM (PORCINE) 5000 UNIT/ML IJ SOLN
5000.0000 [IU] | Freq: Three times a day (TID) | INTRAMUSCULAR | Status: DC
  Administered 2020-11-21 – 2020-12-12 (×60): 5000 [IU] via SUBCUTANEOUS
  Filled 2020-11-21 (×59): qty 1

## 2020-11-21 MED ORDER — SODIUM CHLORIDE 0.9 % IV SOLN
2.0000 g | Freq: Once | INTRAVENOUS | Status: AC
  Administered 2020-11-21: 2 g via INTRAVENOUS
  Filled 2020-11-21: qty 20

## 2020-11-21 MED ORDER — THIAMINE HCL 100 MG/ML IJ SOLN
100.0000 mg | Freq: Every day | INTRAMUSCULAR | Status: DC
  Administered 2020-11-22 – 2020-11-23 (×2): 100 mg via INTRAVENOUS
  Filled 2020-11-21 (×2): qty 2

## 2020-11-21 MED ORDER — THIAMINE HCL 100 MG/ML IJ SOLN
INTRAVENOUS | Status: DC
  Filled 2020-11-21 (×6): qty 1000

## 2020-11-21 MED ORDER — FENTANYL CITRATE PF 50 MCG/ML IJ SOSY
PREFILLED_SYRINGE | INTRAMUSCULAR | Status: AC
  Filled 2020-11-21: qty 2

## 2020-11-21 MED ORDER — DOCUSATE SODIUM 100 MG PO CAPS: 100.0000 mg | ORAL_CAPSULE | Freq: Two times a day (BID) | ORAL | Status: DC | PRN

## 2020-11-21 MED ORDER — SODIUM CHLORIDE 0.9 % IV SOLN
500.0000 mg | Freq: Once | INTRAVENOUS | Status: AC
  Administered 2020-11-21: 500 mg via INTRAVENOUS
  Filled 2020-11-21: qty 500

## 2020-11-21 MED ORDER — FENTANYL BOLUS VIA INFUSION
50.0000 ug | INTRAVENOUS | Status: DC | PRN
  Administered 2020-11-24 – 2020-11-25 (×3): 50 ug via INTRAVENOUS
  Administered 2020-11-27: 100 ug via INTRAVENOUS
  Administered 2020-11-27 (×3): 50 ug via INTRAVENOUS
  Administered 2020-11-28 (×2): 100 ug via INTRAVENOUS
  Administered 2020-11-29: 50 ug via INTRAVENOUS
  Administered 2020-12-01 (×3): 100 ug via INTRAVENOUS
  Administered 2020-12-01: 50 ug via INTRAVENOUS
  Filled 2020-11-21: qty 100

## 2020-11-21 MED ORDER — ORAL CARE MOUTH RINSE
15.0000 mL | OROMUCOSAL | Status: DC
  Administered 2020-11-21 – 2020-12-06 (×137): 15 mL via OROMUCOSAL

## 2020-11-21 NOTE — ED Notes (Signed)
Pt is having delusions and seeing things. Talking to things in the room that are not there. Pt had pulse ox in his pocket and said it was his phone.

## 2020-11-21 NOTE — Progress Notes (Signed)
eLink Physician-Brief Progress Note Patient Name: Seth Harper DOB: 03-13-1965 MRN: [id number]   Date of Service  11/21/2020  HPI/Events of Note  Pt intubated and on versed/fent/propofol in setting of EtOH use disorder  eICU Interventions  Stable hemodynamics; in sync with vent     Intervention Category Evaluation Type: New Patient Evaluation  Jacinta Shoe 11/21/2020, 9:58 PM

## 2020-11-21 NOTE — H&P (Addendum)
NAME:  Seth Harper, MRN:  [id number], DOB:  12-03-1965, LOS: 0 ADMISSION DATE:  11/21/2020, CONSULTATION DATE:  11/21/20 REFERRING MD:  Dorothea Glassman, MD, CHIEF COMPLAINT:  Altered Mentation   History of Present Illness:  Seth Harper is a 55 year old male with history of alcohol abuse/dependence who presented to the ER on 9/19 with progressive shortness of breath, dark colored sputum, back pain and thumb pain. He started to have hallucinations with increasing altered mentation while in the ER. Further history obtained by the ER nursing staff from the patient's significant other who reports the patient fell last Friday and lost consciousness. She says he has not slept in days, usually drinks 1.5 gallons of rum per week and his last drink was on Friday prior to the fall. Due to his increasing agitation and altered mentation he was sedated and intubated in the ER. PCCM has been called to admit the patient.   Pertinent  Medical History  EtOH abuse/dependence  Significant Hospital Events: Including procedures, antibiotic start and stop dates in addition to other pertinent events   9/19 Intubated, admitted to the ICU  Interim History / Subjective:  - Patient received 2L of NS boluses in the ER with improvement of his HR from the 150s to the 130s - He spiked fever to 103 after intubation (received succinylcholine) - He is tachypneic, breathing over the vent - Unable to reach his significant other via phone  Objective   Blood pressure (!) 153/90, pulse (!) 168, temperature (!) 102.8 F (39.3 C), resp. rate (!) 29, height 6\' 3"  (1.905 m), weight 111.6 kg, SpO2 97 %.    Vent Mode: AC FiO2 (%):  [60 %] 60 % Set Rate:  [18 bmp] 18 bmp Vt Set:  [500 mL] 500 mL PEEP:  [5 cmH20] 5 cmH20   Intake/Output Summary (Last 24 hours) at 11/21/2020 1457 Last data filed at 11/21/2020 1328 Gross per 24 hour  Intake 500 ml  Output --  Net 500 ml   Filed Weights   11/21/20 1001  Weight: 111.6 kg     Examination: General: sedated, intubated, diaphoretic HENT: [place]/AT, moist mucous membranes, sclera anicteric Lungs: course breath sounds, tachypneic. No wheezing. Cardiovascular: tachycardic, no murmurs Abdomen: soft, bowel sounds present Extremities: no edema, cool to touch Neuro: sedated GU: foley  Resolved Hospital Problem list     Assessment & Plan:  Altered Mental Status Alcohol Withdrawal/Dependence - CIWA protocol ordered - Continue versed and propofol for sedation - Consider phenobarb protocol - Check EEG - CT head imaging negative for acute hemorrhage or stroke. Consider checking MRI brain.  - Thiamine daily  Acute Hypoxemic Respiratory Failure Emphysema - Continue mechanical ventilatory support - Repeat blood gas post intubation - add fentanyl drip for analgesia along with versed/propofol for sedation - VAP bundle ordered - scheduled duonebs  SIRS Patient with fever, tachycardia, tachypnea, altered mentation and elevated WBC count with unkown source at this time. Differential also includes malignant hyperthermia as fever developed after receiving succinylcholine. - Plan for LP to evaluate for meningitis/encephalitis - Check blood cultures and tracheal aspirate - Given ceftriaxone and azithromycin in ER for CAP coverage.   Lactic Acidosis Anion Gap Metabolic Acidosis - He has received 2L of NS boluses - Is ordered to receive 1L NS+MVI  L1 Compression Fracture s/p fall at home - conservative management  Best Practice (right click and "Reselect all SmartList Selections" daily)   Diet/type: NPO DVT prophylaxis: prophylactic heparin  GI prophylaxis: PPI Lines: N/A  Foley:  N/A Code Status:  full code Last date of multidisciplinary goals of care discussion [N/A]  Labs   CBC: Recent Labs  Lab 11/21/20 1007  WBC 15.6*  NEUTROABS 13.0*  HGB 16.6  HCT 46.8  MCV 98.7  PLT 207    Basic Metabolic Panel: Recent Labs  Lab 11/21/20 1007  NA 133*   K 3.3*  CL 91*  CO2 24  GLUCOSE 118*  BUN 26*  CREATININE 1.08  CALCIUM 9.4   GFR: Estimated Creatinine Clearance: 106.6 mL/min (by C-G formula based on SCr of 1.08 mg/dL). Recent Labs  Lab 11/21/20 1007 11/21/20 1015 11/21/20 1320  WBC 15.6*  --   --   LATICACIDVEN  --  3.6* 3.0*    Liver Function Tests: Recent Labs  Lab 11/21/20 1007  AST 42*  ALT 26  ALKPHOS 90  BILITOT 2.0*  PROT 8.4*  ALBUMIN 3.8   No results for input(s): LIPASE, AMYLASE in the last 168 hours. No results for input(s): AMMONIA in the last 168 hours.  ABG    Component Value Date/Time   PHART 7.49 (H) 11/21/2020 1159   PCO2ART 32 11/21/2020 1159   PO2ART 80 (L) 11/21/2020 1159   HCO3 24.4 11/21/2020 1159   O2SAT 96.7 11/21/2020 1159     Coagulation Profile: No results for input(s): INR, PROTIME in the last 168 hours.  Cardiac Enzymes: No results for input(s): CKTOTAL, CKMB, CKMBINDEX, TROPONINI in the last 168 hours.  HbA1C: No results found for: HGBA1C  CBG: No results for input(s): GLUCAP in the last 168 hours.  Review of Systems:   Unable to obtain review of systems as patient is intubated and sedated.   Past Medical History:  He,  has a past medical history of Asthma.   Surgical History:  History reviewed. No pertinent surgical history.   Social History:   reports that he has been smoking e-cigarettes. He has never used smokeless tobacco. He reports current alcohol use. He reports current drug use. Drug: Marijuana.   Family History:  His family history is not on file.   Allergies Not on File   Home Medications  Prior to Admission medications   Not on File     Critical care time: 50 minutes    Melody Comas, MD [facility] Office: [phone]   See Amion for personal pager PCCM on call pager [phone] until 7pm. Please call Elink 7p-7a. [phone]

## 2020-11-21 NOTE — Progress Notes (Signed)
Eeg done 

## 2020-11-21 NOTE — ED Provider Notes (Addendum)
[facility] Emergency Department Provider Note   ____________________________________________   Event Date/Time   First MD Initiated Contact with Patient 11/21/20 1050     (approximate)  I have reviewed the triage vital signs and the nursing notes.   HISTORY  Chief Complaint Shortness of Breath   HPI Seth Harper is a 55 y.o. male patient reports he is short of breath and is coughing up black phlegm.  He also reports he has been lightheaded and fell 2 days ago and complains of pain in his low back since then he has some redness and swelling of his thumb from another injury.  He is awake alert oriented and follows commands but is easily confused.        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 No pertinent surgical history.  Prior to Admission medications   Not on File    Allergies Patient has no allergy information on record.  No family history on file.  Social History Social History   Tobacco Use   Smoking status: Every Day    Types: E-cigarettes   Smokeless tobacco: Never  Substance Use Topics   Alcohol use: Yes   Drug use: Yes    Types: Marijuana    Review of Systems  Constitutional: No fever/chills Eyes: No visual changes. ENT: No sore throat. Cardiovascular: Denies chest pain. Respiratory: Denies shortness of breath. Gastrointestinal: No abdominal pain.  No nausea, no vomiting.  No diarrhea.  No constipation. Genitourinary: Negative for dysuria. Musculoskeletal: back pain. Skin: Negative for rash. Neurological: Negative for headaches, focal weakness ____________________________________________   PHYSICAL EXAM:  VITAL SIGNS: ED Triage Vitals  Enc Vitals Group     BP 11/21/20 0957 (!) 141/95     Pulse Rate 11/21/20 0957 (!) 150     Resp 11/21/20 0957 (!) 31     Temp 11/21/20 0957 98.6 F  (37 C)     Temp Source 11/21/20 0957 Oral     SpO2 11/21/20 0957 93 %     Weight 11/21/20 1001 246 lb (111.6 kg)     Height 11/21/20 1001 6\' 3"  (1.905 m)     Head Circumference --      Peak Flow --      Pain Score 11/21/20 1001 0     Pain Loc --      Pain Edu? --      Excl. in GC? --     Constitutional: Alert and oriented.  Seems somewhat anxious and easily confused Eyes: Conjunctivae are normal. PERRL. EOMI. Head: Atraumatic. Nose: No congestion/rhinnorhea. Mouth/Throat: Mucous membranes are moist.  Oropharynx non-erythematous. Neck: No stridor.  Neck is supple patient been spontaneously  cardiovascular: Normal rate, regular rhythm. Grossly normal heart sounds.  Good peripheral circulation. Respiratory: Normal respiratory effort.  No retractions. Lungs CTAB. Gastrointestinal: Soft and nontender. No distention. No abdominal bruits. No CVA tenderness. Genitourinary: Normal male genitalia patient appears to be uncircumcised Musculoskeletal: No lower extremity tenderness nor edema.  No joint effusions. Neurologic:  Normal speech and language. No gross focal neurologic deficits are appreciated. No gait instability. Skin:  Skin is warm, dry and intact. No rash noted. Psychiatric: Mood and affect are normal. Speech and behavior are normal.  ____________________________________________   LABS (all labs ordered are listed, but only abnormal results are displayed)  Labs Reviewed  CBC WITH  DIFFERENTIAL/PLATELET - Abnormal; Notable for the following components:      Result Value   WBC 15.6 (*)    MCH 35.0 (*)    Neutro Abs 13.0 (*)    Lymphs Abs 0.5 (*)    Monocytes Absolute 1.2 (*)    Abs Immature Granulocytes 0.74 (*)    All other components within normal limits  BASIC METABOLIC PANEL - Abnormal; Notable for the following components:   Sodium 133 (*)    Potassium 3.3 (*)    Chloride 91 (*)    Glucose, Bld 118 (*)    BUN 26 (*)    Anion gap 18 (*)    All other components  within normal limits  LACTIC ACID, PLASMA - Abnormal; Notable for the following components:   Lactic Acid, Venous 3.6 (*)    All other components within normal limits  LACTIC ACID, PLASMA - Abnormal; Notable for the following components:   Lactic Acid, Venous 3.0 (*)    All other components within normal limits  ACETAMINOPHEN LEVEL - Abnormal; Notable for the following components:   Acetaminophen (Tylenol), Serum <10 (*)    All other components within normal limits  HEPATIC FUNCTION PANEL - Abnormal; Notable for the following components:   Total Protein 8.4 (*)    AST 42 (*)    Total Bilirubin 2.0 (*)    Bilirubin, Direct 0.7 (*)    Indirect Bilirubin 1.3 (*)    All other components within normal limits  SALICYLATE LEVEL - Abnormal; Notable for the following components:   Salicylate Lvl <7.0 (*)    All other components within normal limits  URINE DRUG SCREEN, QUALITATIVE ([facility] ONLY) - Abnormal; Notable for the following components:   Tricyclic, Ur Screen POSITIVE (*)    Cannabinoid 50 Ng, Ur [place] POSITIVE (*)    Benzodiazepine, Ur Scrn POSITIVE (*)    All other components within normal limits  BLOOD GAS, ARTERIAL - Abnormal; Notable for the following components:   pH, Arterial 7.49 (*)    pO2, Arterial 80 (*)    All other components within normal limits  BLOOD GAS, ARTERIAL - Abnormal; Notable for the following components:   pCO2 arterial 49 (*)    pO2, Arterial 141 (*)    All other components within normal limits  CULTURE, BLOOD (ROUTINE X 2)  CULTURE, BLOOD (ROUTINE X 2)  CULTURE, RESPIRATORY W GRAM STAIN  SARS CORONAVIRUS 2 (TAT 6-24 HRS)  RESPIRATORY PANEL BY PCR  HIV ANTIBODY (ROUTINE TESTING W REFLEX)  CBC  CREATININE, SERUM  PHOSPHORUS  LIPASE, BLOOD  AMYLASE  PROCALCITONIN  BRAIN NATRIURETIC PEPTIDE  URINALYSIS, ROUTINE W REFLEX MICROSCOPIC  STREP PNEUMONIAE URINARY ANTIGEN  LEGIONELLA PNEUMOPHILA SEROGP 1 UR AG  OSMOLALITY  TYPE AND SCREEN  TROPONIN I (HIGH  SENSITIVITY)  TROPONIN I (HIGH SENSITIVITY)   ____________________________________________  EKG  EKG read interpreted by me shows sinus tachycardia at 163 normal axis nonspecific ST-T changes likely rate related no acute changes   ____________________________________________  RADIOLOGY Jill Poling, personally viewed and evaluated these images (plain radiographs) as part of my medical decision making, as well as reviewing the written report by the radiologist.  ED MD interpretation: X-ray of the hand read by radiology reviewed by me does not show any acute pathology x-ray of the chest read by radiology reviewed by me does not show any acute pathology CT of the head and neck read by radiology reviewed by me does not show any acute pathology  lumbar spine x-ray read by radiology reviewed by me shows an L1 compression fractureWith no obvious retropulsion.  CT of the chest abdomen pelvis read by radiology showed a little bit of symmetric soft tissue stranding by the L1 compression fracture and some haziness in the apices which may be scarring.  No PE pneumonia or obvious explanation for the patient's symptoms. Official radiology report(s): DG Chest 1 View  Result Date: 11/21/2020 CLINICAL DATA:  SOB EXAM: CHEST  1 VIEW COMPARISON:  None. FINDINGS: The cardiomediastinal silhouette is within normal limits. No pleural effusion. No pneumothorax. No mass or consolidation. No acute osseous abnormality. IMPRESSION: No acute radiographic abnormality in the chest.  No lobar pneumonia. Electronically Signed   By: Olive Bass M.D.   On: 11/21/2020 12:06   DG Lumbar Spine 2-3 Views  Result Date: 11/21/2020 CLINICAL DATA:  Back pain.  Diaphoresis. EXAM: LUMBAR SPINE - 2-3 VIEW COMPARISON:  None. FINDINGS: Five lumbar type vertebral bodies show normal alignment. There is an acute superior endplate fracture at L1 with loss of height anteriorly of 20%. No posterior retropulsion. Chronic lower lumbar  degenerative disc disease and degenerative facet disease is present. IMPRESSION: Acute superior endplate fracture at L1 with loss of height anteriorly of 20%. Electronically Signed   By: Paulina Fusi M.D.   On: 11/21/2020 11:53   CT HEAD WO CONTRAST ( )  Result Date: 11/21/2020 CLINICAL DATA:  Delusions, hallucinations EXAM: CT HEAD WITHOUT CONTRAST TECHNIQUE: Contiguous axial images were obtained from the base of the skull through the vertex without intravenous contrast. COMPARISON:  None. FINDINGS: Brain: There is no evidence of acute intracranial hemorrhage, extra-axial fluid collection, or acute infarct. The ventricles are normal in size. There is no mass lesion. There is no midline shift. Vascular: No hyperdense vessel or unexpected calcification. Skull: Normal. Negative for fracture or focal lesion. Sinuses/Orbits: The imaged paranasal sinuses are clear. The globes and orbits are unremarkable. Other: None. IMPRESSION: Unremarkable head CT with no acute intracranial pathology. Electronically Signed   By: Lesia Hausen M.D.   On: 11/21/2020 11:56   CT Angio Chest PE W and/or Wo Contrast  Result Date: 11/21/2020 CLINICAL DATA:  Shortness of breath with diaphoresis, tachycardia and elevated D-dimer levels. Clinical concern for pulmonary embolism. Left upper quadrant abdominal pain for several days. Patient fell several days ago. EXAM: CT ANGIOGRAPHY CHEST CT ABDOMEN AND PELVIS WITH CONTRAST TECHNIQUE: Multidetector CT imaging of the chest was performed using the standard protocol during bolus administration of intravenous contrast. Multiplanar CT image reconstructions and MIPs were obtained to evaluate the vascular anatomy. Multidetector CT imaging of the abdomen and pelvis was performed using the standard protocol during bolus administration of intravenous contrast. CONTRAST:  OMNIPAQUE IOHEXOL 350 MG/ML SOLN COMPARISON:  None. FINDINGS: Despite efforts by the technologist and patient, mild motion  artifact is present on today's exam and could not be eliminated. This reduces exam sensitivity and specificity. CTA CHEST FINDINGS Cardiovascular: The pulmonary arteries are adequately opacified with contrast to the level of the subsegmental branches. There is no evidence of central pulmonary embolism. Evaluation beyond the segmental level is limited by breathing artifact. No acute systemic arterial abnormalities are identified. There is mild aortic and coronary artery atherosclerosis. The heart size is normal. There is no pericardial effusion. Mediastinum/Nodes: There are no enlarged mediastinal, hilar or axillary lymph nodes. There is a small hiatal hernia. The thyroid gland and trachea appear unremarkable. Lungs/Pleura: No pleural effusion or pneumothorax. There is moderate centrilobular and paraseptal emphysema.  There are patchy ground-glass opacities at both lung apices, right greater than left. There is no confluent airspace opacity or suspicious pulmonary nodule. There is ill-defined nodularity in the left paraspinal region at T8-9. Musculoskeletal/Chest wall: There are multiple old right-sided rib fractures. No acute osseous findings are seen in the chest. As above, ill-defined left paraspinal nodularity at T8-9. CT ABDOMEN AND PELVIS FINDINGS Hepatobiliary: The hepatic density is decreased, consistent with steatosis. No focal lesions are identified. No evidence of gallstones, gallbladder wall thickening or biliary dilatation. Pancreas: Unremarkable. No pancreatic ductal dilatation or surrounding inflammatory changes. Spleen: Normal in size without focal abnormality. Adrenals/Urinary Tract: Both adrenal glands appear normal. No evidence of urinary tract calculus, hydronephrosis or delay in contrast excretion. There is symmetric perinephric soft tissue stranding bilaterally without focal fluid collection. The urinary bladder is moderately distended, without wall thickening or surrounding inflammation.  Stomach/Bowel: No enteric contrast administered. The stomach appears unremarkable for its degree of distension. No evidence of bowel wall thickening, distention or surrounding inflammatory change. The appendix is not clearly visualized, although there is no pericecal inflammation to suggest appendicitis. Mild sigmoid colon diverticular changes are present. There is prominent stool throughout the colon. Vascular/Lymphatic: There are no enlarged abdominal or pelvic lymph nodes. Diffuse aortic and branch vessel atherosclerosis without evidence of aneurysm or large vessel occlusion. Reproductive: The prostate gland and seminal vesicles appear unremarkable. Other: Small umbilical hernia containing only fat. There is a small amount of ill-defined fluid in both pericolic gutters, right greater than left. There is no generalized ascites, focal extraluminal fluid collection or free air. Musculoskeletal: There is an acute appearing superior endplate compression fracture at L1 without osseous retropulsion or involvement of the posterior elements. This results in less than 20% loss of vertebral body height. No other acute osseous findings are seen. There are degenerative changes throughout the lumbar spine. Review of the MIP images confirms the above findings. IMPRESSION: 1. No evidence of acute pulmonary embolism. Pulmonary arterial assessment beyond the segmental level is limited by breathing artifact. 2. Patchy ground-glass opacities at both lung apices may reflect scarring or atypical inflammation. No consolidation or suspicious pulmonary nodule. 3. Mild acute superior endplate compression fracture at L1 without associated osseous retropulsion. This fracture may contribute to retroperitoneal soft tissue edema which tracks asymmetrically into the anterior right perinephric space. No evidence of urinary tract calculus or hydronephrosis. The bladder is mildly distended. 4. Left lower thoracic paraspinal soft tissue nodularity  is of uncertain significance. No well-defined mass is seen, and this could be postinflammatory, inflammatory or secondary to extra medullary hematopoiesis. Suggest follow-up chest CT in 6 months. 5. No acute intra-abdominal findings are identified. 6. Incidental findings including hepatic steatosis, Aortic Atherosclerosis (ICD10-I70.0) and Emphysema (ICD10-J43.9). Electronically Signed   By: Carey Bullocks M.D.   On: 11/21/2020 13:47   CT Cervical Spine Wo Contrast  Result Date: 11/21/2020 CLINICAL DATA:  Neck trauma.  Obtunded. EXAM: CT CERVICAL SPINE WITHOUT CONTRAST TECHNIQUE: Multidetector CT imaging of the cervical spine was performed without intravenous contrast. Multiplanar CT image reconstructions were also generated. COMPARISON:  None. FINDINGS: Alignment: No malalignment. Straightening of the normal cervical lordosis. Skull base and vertebrae: No regional fracture or focal bone lesion. Soft tissues and spinal canal: No traumatic soft tissue finding. Disc levels: Facet osteoarthritis on the right at C4-5 and on the left at C7-T1. Degenerative cervical spondylosis at C5-6 and C6-7 with endplate osteophytes. Mild canal and foraminal narrowing at those levels. Upper chest: Negative Other: None IMPRESSION: No  acute or traumatic finding. Ordinary cervical spondylosis as above. Electronically Signed   By: Paulina Fusi M.D.   On: 11/21/2020 11:57   CT ABDOMEN PELVIS W CONTRAST  Result Date: 11/21/2020 CLINICAL DATA:  Shortness of breath with diaphoresis, tachycardia and elevated D-dimer levels. Clinical concern for pulmonary embolism. Left upper quadrant abdominal pain for several days. Patient fell several days ago. EXAM: CT ANGIOGRAPHY CHEST CT ABDOMEN AND PELVIS WITH CONTRAST TECHNIQUE: Multidetector CT imaging of the chest was performed using the standard protocol during bolus administration of intravenous contrast. Multiplanar CT image reconstructions and MIPs were obtained to evaluate the vascular  anatomy. Multidetector CT imaging of the abdomen and pelvis was performed using the standard protocol during bolus administration of intravenous contrast. CONTRAST:  OMNIPAQUE IOHEXOL 350 MG/ML SOLN COMPARISON:  None. FINDINGS: Despite efforts by the technologist and patient, mild motion artifact is present on today's exam and could not be eliminated. This reduces exam sensitivity and specificity. CTA CHEST FINDINGS Cardiovascular: The pulmonary arteries are adequately opacified with contrast to the level of the subsegmental branches. There is no evidence of central pulmonary embolism. Evaluation beyond the segmental level is limited by breathing artifact. No acute systemic arterial abnormalities are identified. There is mild aortic and coronary artery atherosclerosis. The heart size is normal. There is no pericardial effusion. Mediastinum/Nodes: There are no enlarged mediastinal, hilar or axillary lymph nodes. There is a small hiatal hernia. The thyroid gland and trachea appear unremarkable. Lungs/Pleura: No pleural effusion or pneumothorax. There is moderate centrilobular and paraseptal emphysema. There are patchy ground-glass opacities at both lung apices, right greater than left. There is no confluent airspace opacity or suspicious pulmonary nodule. There is ill-defined nodularity in the left paraspinal region at T8-9. Musculoskeletal/Chest wall: There are multiple old right-sided rib fractures. No acute osseous findings are seen in the chest. As above, ill-defined left paraspinal nodularity at T8-9. CT ABDOMEN AND PELVIS FINDINGS Hepatobiliary: The hepatic density is decreased, consistent with steatosis. No focal lesions are identified. No evidence of gallstones, gallbladder wall thickening or biliary dilatation. Pancreas: Unremarkable. No pancreatic ductal dilatation or surrounding inflammatory changes. Spleen: Normal in size without focal abnormality. Adrenals/Urinary Tract: Both adrenal glands appear  normal. No evidence of urinary tract calculus, hydronephrosis or delay in contrast excretion. There is symmetric perinephric soft tissue stranding bilaterally without focal fluid collection. The urinary bladder is moderately distended, without wall thickening or surrounding inflammation. Stomach/Bowel: No enteric contrast administered. The stomach appears unremarkable for its degree of distension. No evidence of bowel wall thickening, distention or surrounding inflammatory change. The appendix is not clearly visualized, although there is no pericecal inflammation to suggest appendicitis. Mild sigmoid colon diverticular changes are present. There is prominent stool throughout the colon. Vascular/Lymphatic: There are no enlarged abdominal or pelvic lymph nodes. Diffuse aortic and branch vessel atherosclerosis without evidence of aneurysm or large vessel occlusion. Reproductive: The prostate gland and seminal vesicles appear unremarkable. Other: Small umbilical hernia containing only fat. There is a small amount of ill-defined fluid in both pericolic gutters, right greater than left. There is no generalized ascites, focal extraluminal fluid collection or free air. Musculoskeletal: There is an acute appearing superior endplate compression fracture at L1 without osseous retropulsion or involvement of the posterior elements. This results in less than 20% loss of vertebral body height. No other acute osseous findings are seen. There are degenerative changes throughout the lumbar spine. Review of the MIP images confirms the above findings. IMPRESSION: 1. No evidence of acute pulmonary  embolism. Pulmonary arterial assessment beyond the segmental level is limited by breathing artifact. 2. Patchy ground-glass opacities at both lung apices may reflect scarring or atypical inflammation. No consolidation or suspicious pulmonary nodule. 3. Mild acute superior endplate compression fracture at L1 without associated osseous  retropulsion. This fracture may contribute to retroperitoneal soft tissue edema which tracks asymmetrically into the anterior right perinephric space. No evidence of urinary tract calculus or hydronephrosis. The bladder is mildly distended. 4. Left lower thoracic paraspinal soft tissue nodularity is of uncertain significance. No well-defined mass is seen, and this could be postinflammatory, inflammatory or secondary to extra medullary hematopoiesis. Suggest follow-up chest CT in 6 months. 5. No acute intra-abdominal findings are identified. 6. Incidental findings including hepatic steatosis, Aortic Atherosclerosis (ICD10-I70.0) and Emphysema (ICD10-J43.9). Electronically Signed   By: Carey Bullocks M.D.   On: 11/21/2020 13:47   DG Chest Portable 1 View  Result Date: 11/21/2020 CLINICAL DATA:  Status post intubation. EXAM: PORTABLE CHEST 1 VIEW COMPARISON:  Same day. FINDINGS: Stable cardiomediastinal silhouette. Endotracheal tube is in grossly good position. Nasogastric tube is seen entering stomach. Old right rib fractures are noted. Lungs are clear. IMPRESSION: Endotracheal tube in grossly good position. Electronically Signed   By: Lupita Raider M.D.   On: 11/21/2020 14:59   DG Hand Complete Right  Result Date: 11/21/2020 CLINICAL DATA:  Diaphoresis.  Pain. EXAM: RIGHT HAND - COMPLETE 3+ VIEW COMPARISON:  None FINDINGS: No acute or traumatic finding. 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 IMPRESSION: No acute or traumatic finding. Electronically Signed   By: Paulina Fusi M.D.   On: 11/21/2020 11:54    ____________________________________________   PROCEDURES  Procedure(s) performed (including Critical Care): Critical care time 1-1/2 hours.  A good portion of this was spent in the room pushing Ativan and Versed myself.  Additionally I pushed some boluses of propofol when the patient required it before we titrated his propofol drip up high enough.  Then of course  I intubated him read his EKGs and reviewed the other studies and spoke with the ICU doctor about him.  I also spoke to his wife repeatedly and his mother once.  Procedures Oral consent obtained from wife after reviewing risks and benefit of spinal tap.  Patient sedated.  He was placed in the lateral decubitus position and skin was cleaned and patient prepped and draped in usual sterile fashion.  Spinal needle was inserted at L4-5 interspace with no CSF obtained.  I then attempted the interspace above and still though the needle went in almost completely both times did not obtain any CSF.  I stopped at this point and we will try to get the CSF via interventional radiology.  ____________________________________________   INITIAL IMPRESSION / ASSESSMENT AND PLAN / ED COURSE . ----------------------------------------- 4:33 PM on 11/21/2020 ----------------------------------------- Patient had been getting slightly more agitated during the course of his stay.  Ativan was ordered but did get given immediately because of all the other things the nurse had to do.  When it was given the patient really did not have any reaction to it and began to get even more agitated.  We then gave more Ativan quickly and ended up giving multiple doses of Ativan and Versed.  Finally the patient was sedated with propofol and intubated under direct vision with a glide scope and a #8 ET tube.  Came to 24 cm at the teeth.  He was watched go through the cords under direct  vision.  Good bilateral breath sounds and no breath sounds in the stomach and good color change on the colorimetric CO2 detector.  Chest x-ray was then done and showed the ET tube in good position when I reviewed it.  I believe that since the patient's wife and mother reported that he had been falling several times in the last few days and been cutting back on his alcohol and had begun getting shaky and agitated that he was withdrawing.  His mother told me in fact  that he had gone to sleep last night and when he woke up this morning without drinking anything had gotten very shaky and was started seeing things.  He seemed to be withdrawing.  He did not initially have a fever.  Had no history of fever at home either.  However because of his developing a fever when he became very agitated and the fact that he was hallucinating although they were visual hallucinations I felt that it would be safest to do a spinal tap.  This was attempted without success.  It is most likely that the patient was withdrawing from alcohol as both his wife and his mother agreed with him that he was drinking a lot.  Patient did not complain of any headache or neck stiffness or other symptoms except for the cough productive of some black phlegm.  His wife reported he had some blood in his urine.  His urine was orange when we did cath him.  He had not been urinating more than twice a day recently and had not had a stool in several days in fact since he fell.  I think it is a good idea at some point to get a lumbar MRI to make sure he is not having any slipped disc or anything causing nerve impingement.  He did deny any numbness in his perineal area.  He reported that his urinary function had been normal for him. Patient additionally reported he had hit his thumb very hard and the right MCP joint area was red tender and warm.  X-ray was negative.  This could just be inflammation from being hit or could possibly be a cellulitis although there was no sign of any skin wound in the area of the redness.  Patient is getting antibiotics just in case he does have meningitis or encephalitis.  I doubt that that is the case however.              ____________________________________________   FINAL CLINICAL IMPRESSION(S) / ED DIAGNOSES  Final diagnoses:  Altered mental status, unspecified altered mental status type     ED Discharge Orders     None        Note:  This document was prepared  using Dragon voice recognition software and may include unintentional dictation errors.    Arnaldo Natal, MD 11/21/20 1640    Arnaldo Natal, MD 11/21/20 [phone]

## 2020-11-21 NOTE — ED Notes (Signed)
Per ICU MD Dewald, decrease propofol by 10 mg Q 30 mins to help with hypotension.

## 2020-11-21 NOTE — ED Notes (Signed)
Pt is extremely agitated and restless and still talking out of his head not making much sense when he speaks. Pt tried to get out of bed. Pt coached back to bed. Pt keeps pulling off his cardiac leads. Unable to keep pulse ox on him at this time.

## 2020-11-21 NOTE — ED Notes (Signed)
Pt is also complaining of lower back pain and thumb pain from previous falls.

## 2020-11-21 NOTE — Progress Notes (Signed)
Pharmacy Electrolyte Monitoring Consult:  Pharmacy consulted to assist in monitoring and replacing electrolytes in this      Labs:   Potassium (mmol/L)  Date Value  11/21/2020 3.3 (L)   Calcium (mg/dL)  Date Value  75/07/1831 9.4   Albumin (g/dL)  Date Value  58/25/1898 3.8    Assessment/Plan: Pharmacy consulted for electrolytes monitoring and replacement, please antibiotics renal dose adjustment.   CrCl remains > 116ml/min. Noted Na at 133 patient currently receives banana bag in NS, will follow up tomorrow morning. K of 3.3 will require supplementation. Order for KCl x 3 placed.  Pharmacy will continue to follow and replace as needed.  Eydie Wormley Rodriguez-Guzman PharmD, BCPS 11/21/2020 5:11 PM

## 2020-11-21 NOTE — ED Notes (Signed)
Pt rapidly deteriorating. MD at bedside.

## 2020-11-21 NOTE — ED Notes (Signed)
22mg  of versed admin total in increments of 5 mg every 2 mins  8 mg of ativan admin in increments of 2mg . Every 2 mins until sedated

## 2020-11-21 NOTE — ED Notes (Signed)
MD Malinda attempted spinal tap but was unsuccessful.

## 2020-11-21 NOTE — Procedures (Signed)
Patient Name: Seth Harper  MRN: [id number]  Epilepsy Attending: Charlsie Quest  Referring Physician/Provider: Dr Melody Comas Date: 11/21/2020 Duration: 22.44 mins  Patient history: 54yo M with ams. EEG to evaluate for seizure.   Level of alertness: comatose  AEDs during EEG study: None  Technical aspects: This EEG study was done with scalp electrodes positioned according to the 10-20 International system of electrode placement. Electrical activity was acquired at a sampling rate of 500Hz  and reviewed with a high frequency filter of 70Hz  and a low frequency filter of 1Hz . EEG data were recorded continuously and digitally stored.   Description: EEG showed continuous generalized 2-3Hz  delta slowing admixed with an excessive amount of 15 to 18 Hz beta activity distributed symmetrically and diffusely. Hyperventilation and photic stimulation were not performed.     ABNORMALITY - Continuous slow, generalized - Excessive beta, generalized  IMPRESSION: This study is suggestive of severe diffuse encephalopathy, nonspecific etiology but likely related to sedation. No seizures or epileptiform discharges were seen throughout the recording.  Channah Godeaux 

## 2020-11-21 NOTE — ED Notes (Signed)
Wife at bedside. Pt had fall on Friday and struck head and had loss of consciousness. He hasnt slept in days. Pt does drink about 3 half gallon of rum a week. He hasnt drunk a lot of ETOH since the fall as well.

## 2020-11-21 NOTE — ED Notes (Signed)
Informed RN bed assigned 

## 2020-11-21 NOTE — Consult Note (Signed)
  Writer attempted psychiatric consult on patient. Patient's door was closed. Writer told by nursing staff that patient has become quite ill, intubation required earlier and LP was attempted. Medical staff with him now. Patient unable to participate in psychiatric assessment at this time. Will follow up tomorrow.

## 2020-11-21 NOTE — ED Notes (Signed)
Wife Number call with update  [phone]

## 2020-11-21 NOTE — ED Triage Notes (Signed)
Patient c/o SOB. Patient diaphoretic, HR in the 150s. Patient with 3--5 word sentences. Patient denies pain and chest discomfort. Patient reports dark colored, productive sputum.

## 2020-11-21 NOTE — ED Notes (Signed)
3rd bottle of prop placed

## 2020-11-22 ENCOUNTER — Inpatient Hospital Stay: Payer: Medicaid Other

## 2020-11-22 DIAGNOSIS — J9601 Acute respiratory failure with hypoxia: Secondary | ICD-10-CM

## 2020-11-22 LAB — GLUCOSE, CAPILLARY
Glucose-Capillary: 112 mg/dL — ABNORMAL HIGH (ref 70–99)
Glucose-Capillary: 113 mg/dL — ABNORMAL HIGH (ref 70–99)
Glucose-Capillary: 113 mg/dL — ABNORMAL HIGH (ref 70–99)
Glucose-Capillary: 114 mg/dL — ABNORMAL HIGH (ref 70–99)
Glucose-Capillary: 115 mg/dL — ABNORMAL HIGH (ref 70–99)
Glucose-Capillary: 115 mg/dL — ABNORMAL HIGH (ref 70–99)
Glucose-Capillary: 116 mg/dL — ABNORMAL HIGH (ref 70–99)

## 2020-11-22 LAB — CBC
HCT: 36.1 % — ABNORMAL LOW (ref 39.0–52.0)
Hemoglobin: 12.7 g/dL — ABNORMAL LOW (ref 13.0–17.0)
MCH: 35.8 pg — ABNORMAL HIGH (ref 26.0–34.0)
MCHC: 35.2 g/dL (ref 30.0–36.0)
MCV: 101.7 fL — ABNORMAL HIGH (ref 80.0–100.0)
Platelets: 147 10*3/uL — ABNORMAL LOW (ref 150–400)
RBC: 3.55 MIL/uL — ABNORMAL LOW (ref 4.22–5.81)
RDW: 13.3 % (ref 11.5–15.5)
WBC: 10.3 10*3/uL (ref 4.0–10.5)
nRBC: 0 % (ref 0.0–0.2)

## 2020-11-22 LAB — BLOOD GAS, ARTERIAL
Acid-base deficit: 0.7 mmol/L (ref 0.0–2.0)
Bicarbonate: 24.2 mmol/L (ref 20.0–28.0)
FIO2: 0.4
MECHVT: 500 mL
Mechanical Rate: 22
O2 Saturation: 97 %
PEEP: 5 cmH2O
Patient temperature: 37
RATE: 22 resp/min
pCO2 arterial: 40 mmHg (ref 32.0–48.0)
pH, Arterial: 7.39 (ref 7.350–7.450)
pO2, Arterial: 91 mmHg (ref 83.0–108.0)

## 2020-11-22 LAB — BASIC METABOLIC PANEL
Anion gap: 9 (ref 5–15)
BUN: 24 mg/dL — ABNORMAL HIGH (ref 6–20)
CO2: 23 mmol/L (ref 22–32)
Calcium: 8.1 mg/dL — ABNORMAL LOW (ref 8.9–10.3)
Chloride: 102 mmol/L (ref 98–111)
Creatinine, Ser: 0.79 mg/dL (ref 0.61–1.24)
GFR, Estimated: >60 mL/min (ref >60)
Glucose, Bld: 118 mg/dL — ABNORMAL HIGH (ref 70–99)
Potassium: 3.6 mmol/L (ref 3.5–5.1)
Sodium: 134 mmol/L — ABNORMAL LOW (ref 135–145)

## 2020-11-22 LAB — STREP PNEUMONIAE URINARY ANTIGEN: Strep Pneumo Urinary Antigen: NEGATIVE

## 2020-11-22 LAB — HIV ANTIBODY (ROUTINE TESTING W REFLEX): HIV Screen 4th Generation wRfx: NONREACTIVE

## 2020-11-22 LAB — MAGNESIUM
Magnesium: 2.4 mg/dL (ref 1.7–2.4)
Magnesium: 3 mg/dL — ABNORMAL HIGH (ref 1.7–2.4)

## 2020-11-22 LAB — POTASSIUM: Potassium: 3.7 mmol/L (ref 3.5–5.1)

## 2020-11-22 LAB — PHOSPHORUS
Phosphorus: 2.7 mg/dL (ref 2.5–4.6)
Phosphorus: 3.1 mg/dL (ref 2.5–4.6)

## 2020-11-22 LAB — SARS CORONAVIRUS 2 (TAT 6-24 HRS): SARS Coronavirus 2: NEGATIVE

## 2020-11-22 LAB — LACTIC ACID, PLASMA: Lactic Acid, Venous: 0.9 mmol/L (ref 0.5–1.9)

## 2020-11-22 IMAGING — DX DG CHEST 1V PORT
2 series · 2 of 2 positions shown · non-contrast
Comparison: [DATE].

CLINICAL DATA: Respiratory failure.

EXAM:
PORTABLE CHEST 1 VIEW

[chest ap (1 of 2)]
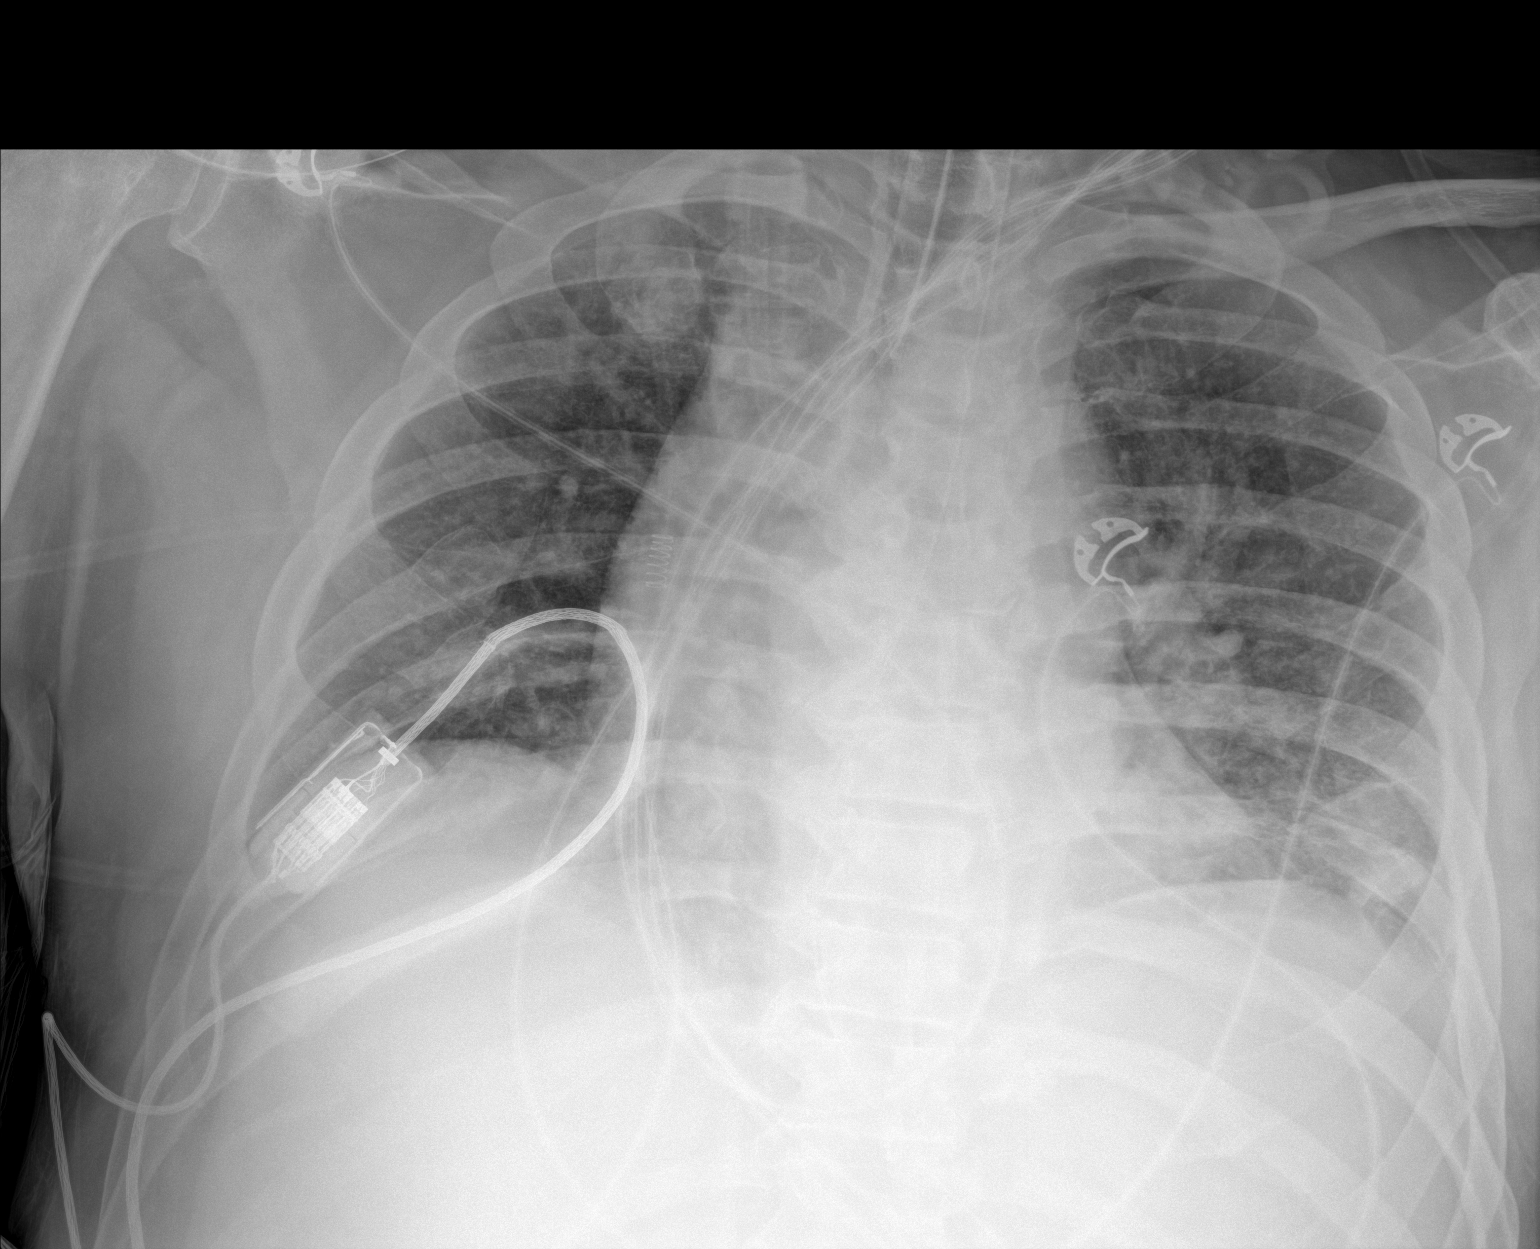

[chest ap (2 of 2)]
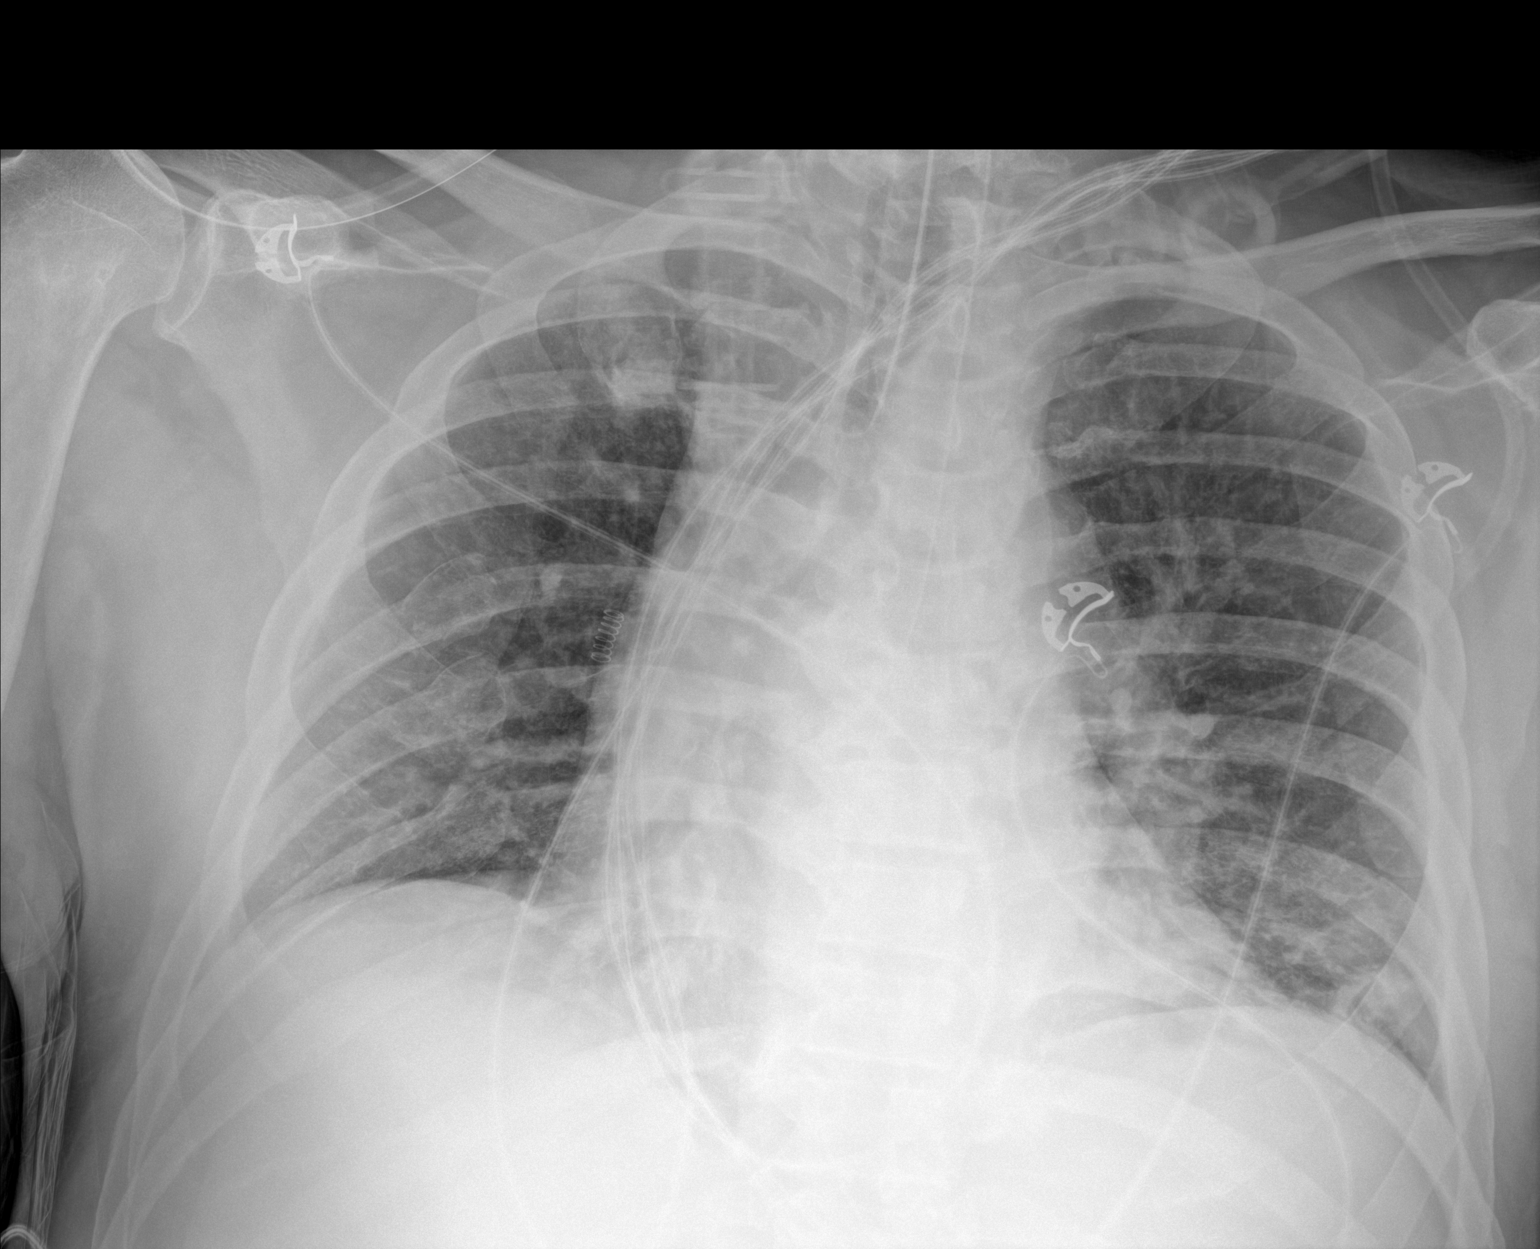

[2 of 2 positions shown; findings below may reference images not displayed]

FINDINGS: Stable cardiomegaly. Endotracheal and nasogastric tubes are
unchanged in position. Right lung is clear. Increased left basilar
atelectasis or infiltrate is noted. Bony thorax is unremarkable.
IMPRESSION: Endotracheal and nasogastric tubes are in good position. Increased
left basilar opacity is noted as described above.

## 2020-11-22 IMAGING — MR MR HEAD W/O CM
12 series · 48 of 48 positions shown · non-contrast
Comparison: None.

CLINICAL DATA: Mental status change, unknown cause alcoholic with
AMS, r/o meningitis/encephalitis or stroke

EXAM:
MRI HEAD WITHOUT CONTRAST
TECHNIQUE: Multiplanar, multiecho pulse sequences of the brain and surrounding
structures were obtained without intravenous contrast.

[Series 5: ax dwi_tracew · axial · 3.0mm · 0.71mm/px · z∈[-20,+136]mm · 4 of 56 slices shown]
[im 1/56]
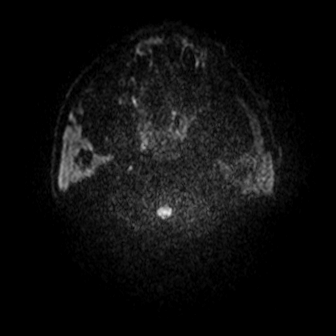
[im 19/56]
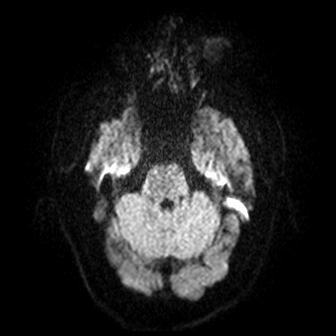
[im 37/56]
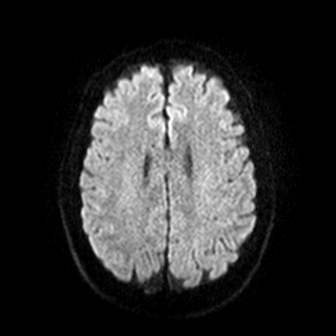
[im 56/56]
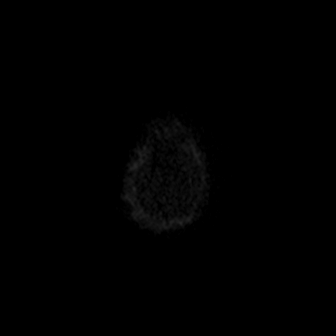

[Series 6: ax dwi_adc · axial · 3.0mm · 0.71mm/px · z∈[-20,+136]mm · 4 of 56 slices shown]
[im 1/56]
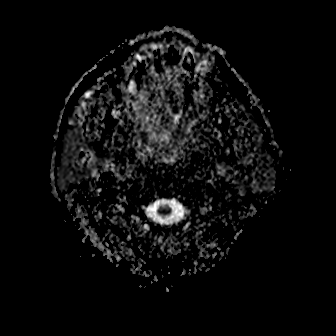
[im 19/56]
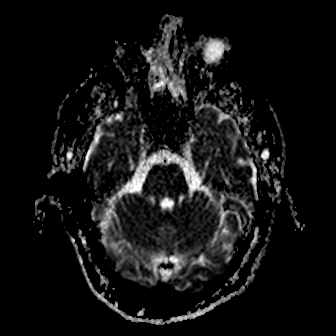
[im 37/56]
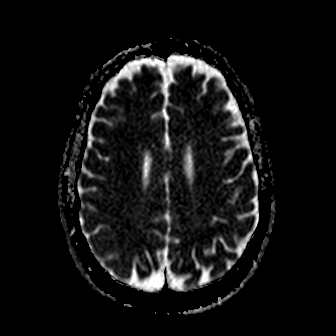
[im 56/56]
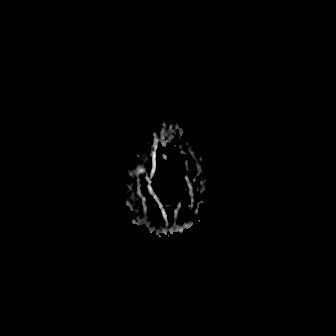

[Series 7: cor dwi_tracew · coronal · 5.0mm · 0.68mm/px · 3 of 40 slices shown]
[im 1/40]
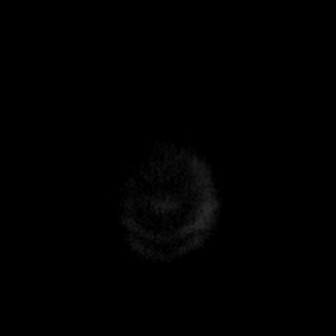
[im 20/40]
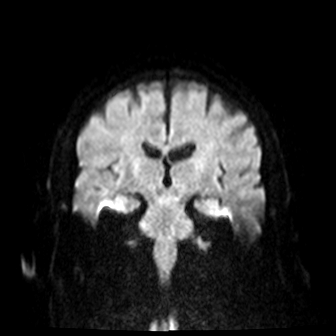
[im 40/40]
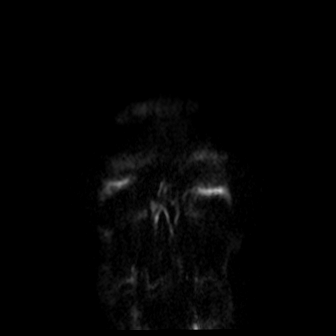

[Series 8: cor dwi_adc · coronal · 5.0mm · 0.68mm/px · 3 of 40 slices shown]
[im 1/40]
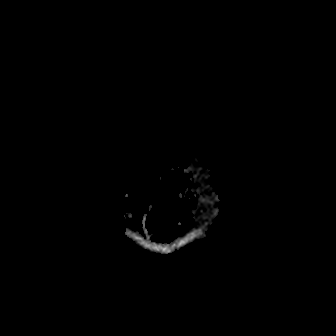
[im 20/40]
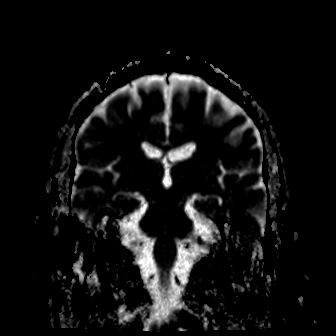
[im 40/40]
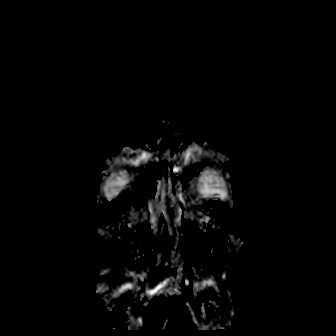

[Series 9: T1 · sagittal · 5.0mm · 0.47mm/px · 2 of 22 slices shown (1 of 2)]
[im 1/22]
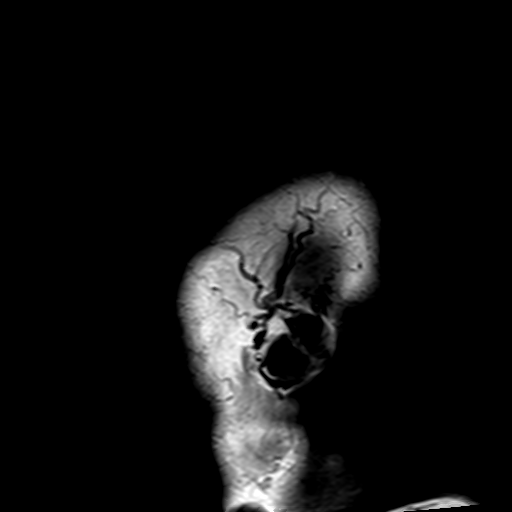
[im 22/22]
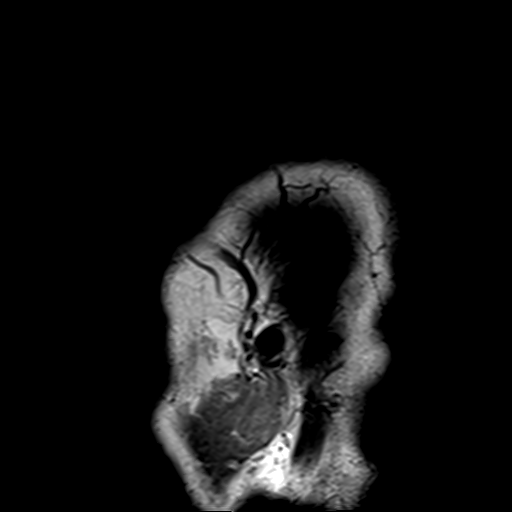

[Series 10: T2 · axial · 5.0mm · 0.90mm/px · z∈[-12,+124]mm · 2 of 25 slices shown (1 of 2)]
[im 1/25]
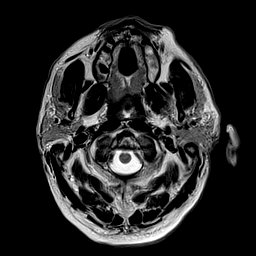
[im 25/25]
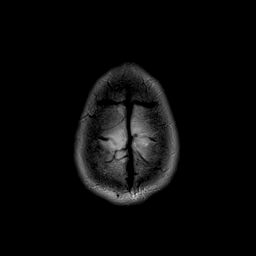

[Series 11: mag_images · axial · 3.0mm · 0.90mm/px · z∈[-18,+126]mm · 4 of 52 slices shown]
[im 1/52]
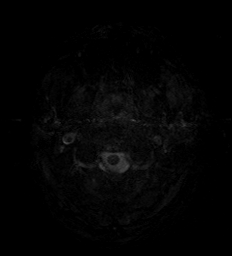
[im 18/52]
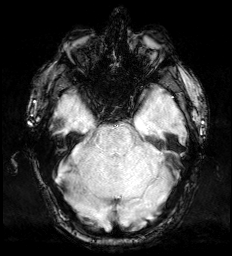
[im 35/52]
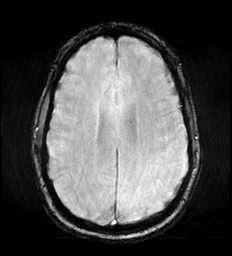
[im 52/52]
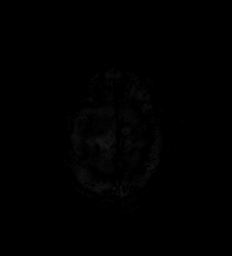

[Series 12: pha_images · axial · 3.0mm · 0.90mm/px · z∈[-18,+126]mm · 4 of 52 slices shown]
[im 1/52]
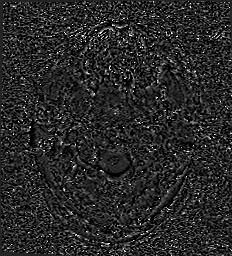
[im 18/52]
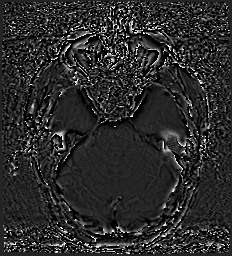
[im 35/52]
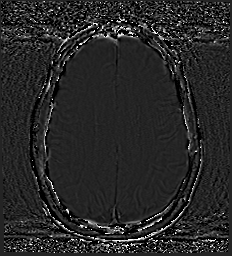
[im 52/52]
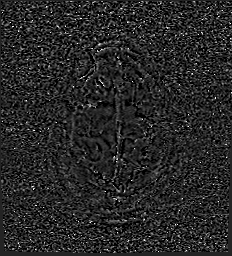

[Series 13: swi_images · axial · 3.0mm · 0.90mm/px · z∈[-18,+126]mm · 4 of 52 slices shown]
[im 1/52]
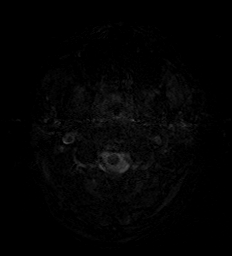
[im 18/52]
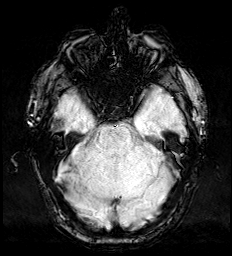
[im 35/52]
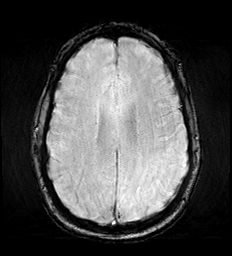
[im 52/52]
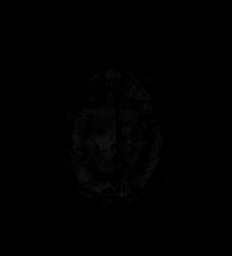

[Series 15: FLAIR · axial · 3.0mm · 0.72mm/px · z∈[-21,+132]mm · 4 of 55 slices shown]
[im 1/55]
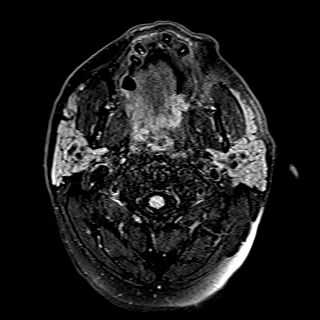
[im 19/55]
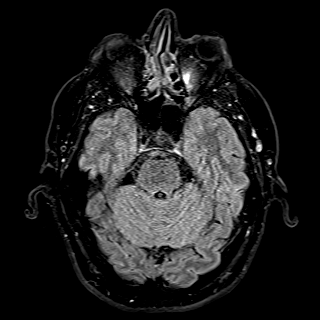
[im 37/55]
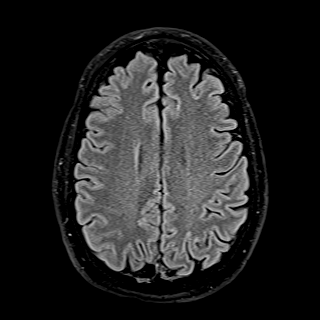
[im 55/55]
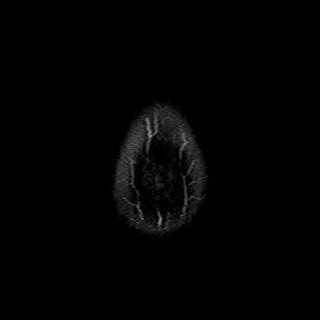

[Series 16: T1 · axial · 1.0mm · 0.98mm/px · z∈[-17,+133]mm · 12 of 160 slices shown (2 of 2)]
[im 1/160]
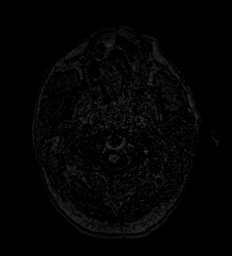
[im 15/160]
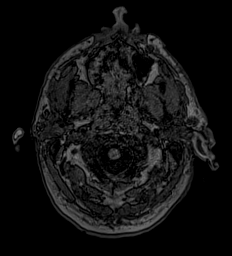
[im 29/160]
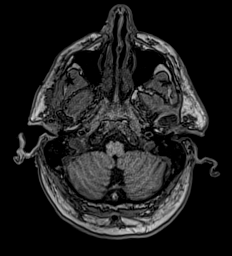
[im 44/160]
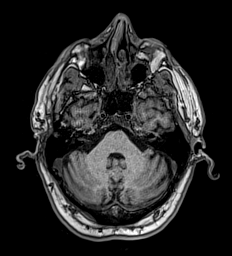
[im 58/160]
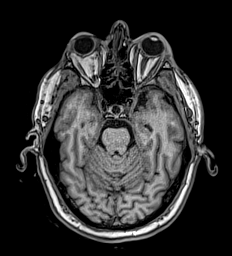
[im 73/160]
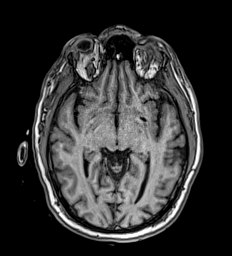
[im 87/160]
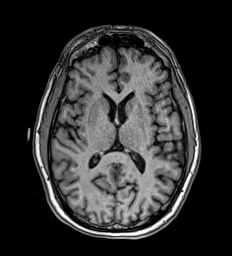
[im 102/160]
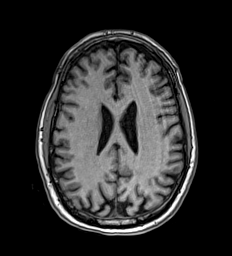
[im 116/160]
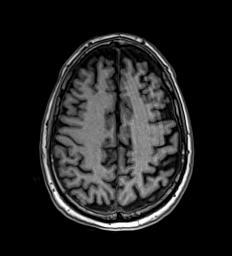
[im 131/160]
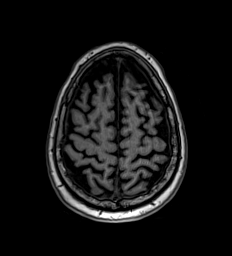
[im 145/160]
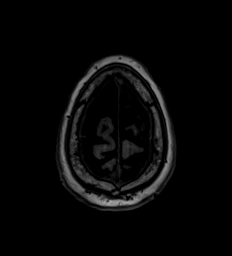
[im 160/160]
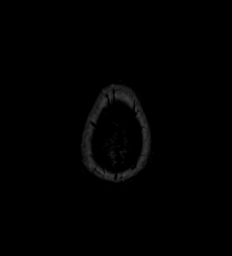

[Series 17: T2 · coronal · 5.0mm · 0.86mm/px · 2 of 30 slices shown (2 of 2)]
[im 1/30]
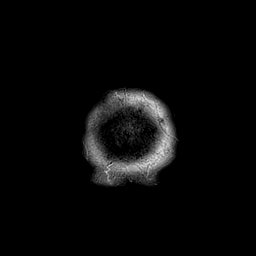
[im 30/30]
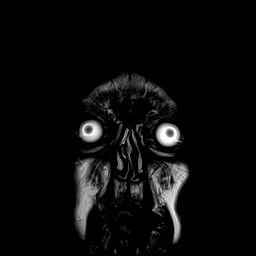

[48 of 48 positions shown; findings below may reference images not displayed]

FINDINGS: Brain: There is no acute infarction or intracranial hemorrhage.
There is no intracranial mass, mass effect, or edema. There is no
hydrocephalus or extra-axial fluid collection. Ventricles and sulci
are within normal limits in size and configuration. A few small foci
of T2 hyperintensity in the supratentorial white matter are
nonspecific but may reflect minimal chronic microvascular ischemic
changes or gliosis/demyelination of other etiologies.

Vascular: Major vessel flow voids at the skull base are preserved.

Skull and upper cervical spine: Normal marrow signal is preserved.

Sinuses/Orbits: Patchy mucosal thickening.  Orbits are unremarkable.

Other: Sella is unremarkable.  Patchy mastoid fluid opacification.
IMPRESSION: No evidence of recent infarction, hemorrhage, or mass.

## 2020-11-22 MED ORDER — THIAMINE HCL 100 MG/ML IJ SOLN
Freq: Three times a day (TID) | INTRAVENOUS | Status: AC
  Filled 2020-11-22 (×9): qty 50

## 2020-11-22 MED ORDER — VITAL 1.5 CAL PO LIQD
1000.0000 mL | ORAL | Status: DC
  Administered 2020-11-22 – 2020-11-27 (×4): 1000 mL

## 2020-11-22 MED ORDER — ROCURONIUM BROMIDE 10 MG/ML (PF) SYRINGE: 1.0000 mg/kg | PREFILLED_SYRINGE | Freq: Once | INTRAVENOUS | Status: DC | PRN

## 2020-11-22 MED ORDER — FOLIC ACID 1 MG PO TABS
1.0000 mg | ORAL_TABLET | Freq: Every day | ORAL | Status: DC
  Administered 2020-11-22 – 2020-12-04 (×13): 1 mg
  Filled 2020-11-22 (×13): qty 1

## 2020-11-22 MED ORDER — DEXTROSE 5 % IV SOLN
250.0000 mg | INTRAVENOUS | Status: DC
  Filled 2020-11-22: qty 250

## 2020-11-22 MED ORDER — ADULT MULTIVITAMIN W/MINERALS CH
1.0000 | ORAL_TABLET | Freq: Every day | ORAL | Status: DC
  Administered 2020-11-22 – 2020-12-04 (×13): 1
  Filled 2020-11-22 (×13): qty 1

## 2020-11-22 MED ORDER — ALBUTEROL SULFATE (2.5 MG/3ML) 0.083% IN NEBU: 2.5000 mg | INHALATION_SOLUTION | RESPIRATORY_TRACT | Status: DC | PRN

## 2020-11-22 MED ORDER — FREE WATER
30.0000 mL | Status: DC
  Administered 2020-11-22 – 2020-12-04 (×67): 30 mL

## 2020-11-22 MED ORDER — PROSOURCE TF PO LIQD
90.0000 mL | Freq: Three times a day (TID) | ORAL | Status: DC
  Administered 2020-11-22 – 2020-11-28 (×16): 90 mL
  Filled 2020-11-22: qty 90

## 2020-11-22 MED ORDER — ROCURONIUM BROMIDE 50 MG/5ML IV SOLN
1.0000 mg/kg | Freq: Once | INTRAVENOUS | Status: DC | PRN
  Filled 2020-11-22: qty 11.16

## 2020-11-22 MED ORDER — SODIUM CHLORIDE 0.9 % IV SOLN
2.0000 g | INTRAVENOUS | Status: DC
  Administered 2020-11-22 – 2020-11-24 (×3): 2 g via INTRAVENOUS
  Filled 2020-11-22 (×2): qty 2
  Filled 2020-11-22: qty 20

## 2020-11-22 MED ORDER — OSMOLITE 1.5 CAL PO LIQD
1000.0000 mL | ORAL | Status: DC
  Administered 2020-11-22: 1000 mL

## 2020-11-22 MED ORDER — PROSOURCE TF PO LIQD
45.0000 mL | Freq: Two times a day (BID) | ORAL | Status: DC
  Administered 2020-11-22: 45 mL
  Filled 2020-11-22 (×2): qty 45

## 2020-11-22 MED ORDER — VITAL HIGH PROTEIN PO LIQD: 1000.0000 mL | ORAL | Status: DC

## 2020-11-22 MED ORDER — AZITHROMYCIN 250 MG PO TABS
250.0000 mg | ORAL_TABLET | Freq: Every day | ORAL | Status: AC
  Administered 2020-11-22 – 2020-11-25 (×4): 250 mg
  Filled 2020-11-22 (×4): qty 1

## 2020-11-22 NOTE — Progress Notes (Signed)
NAME:  Seth Harper, MRN:  [id number], DOB:  1965/05/22, LOS: 1 ADMISSION DATE:  11/21/2020, CONSULTATION DATE:  11/21/20 REFERRING MD:  Dorothea Glassman, MD, CHIEF COMPLAINT:  Altered Mentation   History of Present Illness:  Seth Harper is a 55 year old male with history of alcohol abuse/dependence who presented to the ER on 9/19 with progressive shortness of breath, dark colored sputum, back pain and thumb pain. He started to have hallucinations with increasing altered mentation while in the ER. Further history obtained by the ER nursing staff from the patient's significant other who reports the patient fell last Friday and lost consciousness. She says he has not slept in days, usually drinks 1.5 gallons of rum per week and his last drink was on Friday prior to the fall. Due to his increasing agitation and altered mentation he was sedated and intubated in the ER. PCCM has been called to admit the patient.   Pertinent  Medical History  EtOH abuse/dependence  Significant Hospital Events: Including procedures, antibiotic start and stop dates in addition to other pertinent events   9/19 Intubated, admitted to the ICU  Interim History / Subjective:   No acute events overnight. He is sedated with propofol and fentanyl at this time. Versed was weaned off overnight. No seizure activity noted on the EEG. No further fevers since the ED. Lactic acid has come down after fluid resuscitation.  Objective   Blood pressure 113/68, pulse 92, temperature 98.2 F (36.8 C), resp. rate (!) 23, height 6\' 3"  (1.905 m), weight 111.6 kg, SpO2 96 %.    Vent Mode: PRVC FiO2 (%):  [40 %-60 %] 40 % Set Rate:  [18 bmp-22 bmp] 22 bmp Vt Set:  [500 mL] 500 mL PEEP:  [5 cmH20] 5 cmH20 Plateau Pressure:  [15 cmH20] 15 cmH20   Intake/Output Summary (Last 24 hours) at 11/22/2020 0917 Last data filed at 11/22/2020 0600 Gross per 24 hour  Intake 3450.61 ml  Output 2100 ml  Net 1350.61 ml   Filed Weights   11/21/20  1001  Weight: 111.6 kg    Examination: General: sedated, intubated, no acute distress HENT: [place]/AT, moist mucous membranes, sclera anicteric Lungs: diminished breath sounds bilaterally. No wheezing. Cardiovascular: rrr, no murmurs Abdomen: soft, bowel sounds present Extremities: no edema, warm Neuro: sedated GU: foley  Resolved Hospital Problem list     Assessment & Plan:  Altered Mental Status Alcohol Withdrawal/Dependence - CIWA protocol ordered - Continue versed and fentanyl for sedation/analgesia. Wean off propofol this AM.  - Consider phenobarb protocol - EEG shows diffuse encephalopathy, no seizure activity. - CT head imaging negative for acute hemorrhage or stroke. Will check MRI brain this morning. - Thiamine daily  Acute Hypoxemic Respiratory Failure Emphysema - Continue mechanical ventilatory support - Versed/fentanyl for sedation/analgesia - VAP bundle ordered - scheduled duonebs  SIRS Patient with fever, tachycardia, tachypnea, altered mentation and elevated WBC count with unkown source at this time. LP unsuccessful in the ER. Possibly related to withdrawal vs infection  - f/u blood cultures and tracheal aspirate. Procalcitonin 12. - Given ceftriaxone, vancomycin and azithromycin in ER on admission. - MRSA screen is negative. - Will continue ceftriaxone + azithromycin for possible CAP.  Lactic Acidosis - resolved Anion Gap Metabolic Acidosis - resolved - lactic acidosis and anion gap resolved in the setting of hypovolemia which has resolved with fluid resuscitation  L1 Compression Fracture s/p fall at home - conservative management - pain control   Malnutrition due to chronic alcohol use -  start tube feeds today - Mg & Phos check twice daily  Best Practice (right click and "Reselect all SmartList Selections" daily)   Diet/type: tubefeeds DVT prophylaxis: prophylactic heparin  GI prophylaxis: PPI Lines: N/A Foley:  N/A Code Status:  full  code Last date of multidisciplinary goals of care discussion [N/A]  Labs   CBC: Recent Labs  Lab 11/21/20 1007 11/22/20 0355  WBC 15.6* 10.3  NEUTROABS 13.0*  --   HGB 16.6 12.7*  HCT 46.8 36.1*  MCV 98.7 101.7*  PLT 207 147*    Basic Metabolic Panel: Recent Labs  Lab 11/21/20 1007 11/21/20 2105 11/22/20 0355  NA 133*  --  134*  K 3.3*  --  3.6  CL 91*  --  102  CO2 24  --  23  GLUCOSE 118*  --  118*  BUN 26*  --  24*  CREATININE 1.08  --  0.79  CALCIUM 9.4  --  8.1*  MG  --   --  2.4  PHOS  --  3.5 2.7   GFR: Estimated Creatinine Clearance: 143.9 mL/min (by C-G formula based on SCr of 0.79 mg/dL). Recent Labs  Lab 11/21/20 1007 11/21/20 1015 11/21/20 1320 11/21/20 2105 11/21/20 2349 11/22/20 0355  PROCALCITON  --   --   --  12.87  --   --   WBC 15.6*  --   --   --   --  10.3  LATICACIDVEN  --  3.6* 3.0* 1.5 0.9  --     Liver Function Tests: Recent Labs  Lab 11/21/20 1007  AST 42*  ALT 26  ALKPHOS 90  BILITOT 2.0*  PROT 8.4*  ALBUMIN 3.8   Recent Labs  Lab 11/21/20 2105  LIPASE 28  AMYLASE 32   No results for input(s): AMMONIA in the last 168 hours.  ABG    Component Value Date/Time   PHART 7.39 11/22/2020 0353   PCO2ART 40 11/22/2020 0353   PO2ART 91 11/22/2020 0353   HCO3 24.2 11/22/2020 0353   ACIDBASEDEF 0.7 11/22/2020 0353   O2SAT 97.0 11/22/2020 0353     Coagulation Profile: No results for input(s): INR, PROTIME in the last 168 hours.  Cardiac Enzymes: No results for input(s): CKTOTAL, CKMB, CKMBINDEX, TROPONINI in the last 168 hours.  HbA1C: No results found for: HGBA1C  CBG: Recent Labs  Lab 11/21/20 2045 11/21/20 2359 11/22/20 0315 11/22/20 0727  GLUCAP 137* 116* 113* 113*    Critical care time: 40 minutes    Melody Comas, MD [facility] Office: [phone]   See Amion for personal pager PCCM on call pager [phone] until 7pm. Please call Elink 7p-7a. [phone]

## 2020-11-22 NOTE — Progress Notes (Signed)
Pt placed on PCV per MD request due to A-Sync on PRVC. Pt seems to tolerate

## 2020-11-22 NOTE — Progress Notes (Addendum)
Initial Nutrition Assessment  DOCUMENTATION CODES:  Not applicable  INTERVENTION:  Initiate tube feeding via OG tube: Vital 1.5 at 25 ml/h and increase by 10 ml by 8 hrs to goal rate 55 ml/h (1320 ml per day) Prosource TF 90 ml TID  Provides 2220 kcal, 155 gm protein, 1003 ml free water daily.  MVI with minerals and folic acid per tube added by Pharmacy.  Monitor magnesium and phosphorus 5a and 5p x 4 occurrences for at least 3 days, MD to replete as needed, as pt is at risk for refeeding syndrome.  Consider checking Vitamin C and A labs due to history of tobacco use and EtOH abuse.  NUTRITION DIAGNOSIS:  Inadequate oral intake related to chronic illness (EtOH abuse) as evidenced by per patient/family report.  GOAL:  Patient will meet greater than or equal to 90% of their needs  MONITOR:  TF tolerance, Vent status, Labs, Weight trends, I & O's, Diet advancement  REASON FOR ASSESSMENT:  Ventilator, Consult Enteral/tube feeding initiation and management  ASSESSMENT:  55 yo male with a PMH of alcohol abuse/dependence (1.5 gallons rum per week) and reported fall on 9/16 who was admitted on 9/19 for AMS. 9/19 - intubated; venting NGT placed  Pt on CIWA protocol for EtOH withdrawal. Plan for propofol weaning off later today, plan to try after MRI this afternoon. Work-up in progress for AMS - withdrawal vs infection vs stroke? MRI pending. Pt currently being treated for emphysema.  Pt also noted to have L1 compression fracture after fall - no surgery planned.  Spoke with pt's brother at bedside. 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  He reports that pt usually eats one meal per day. Pt is at high refeeding risk due to a lack of caloric intake the ongoing EtOH use.  Pt with no true weight  history. Brother unsure on interview.  Patient is currently intubated on ventilator support MV: 10 L/min Temp (24hrs), Avg:98.8 F (37.1 C), Min:97.2 F (36.2 C), Max:103 F (39.4 C)  Propofol: 26 ml/hr (provides 686 kcal from fat)  OG tube per X-ray enters the stomach.  Medications: reviewed; colace BID per tube, Protonix per IV, miralax per tube, thiamine per IV  Drips: Fentanyl, Versed  Labs: reviewed; Na 134 (L), BUN 24 (H), CBG 113-137   NUTRITION - FOCUSED PHYSICAL EXAM: Flowsheet Row Most Recent Value  Orbital Region Mild depletion  Upper Arm Region No depletion  Thoracic and Lumbar Region No depletion  Buccal Region Unable to assess  Temple Region Mild depletion  Clavicle Bone Region No depletion  Clavicle and Acromion Bone Region No depletion  Scapular Bone Region No depletion  Dorsal Hand No depletion  Patellar Region No depletion  Anterior Thigh Region Mild depletion  Posterior Calf Region No depletion  Edema (RD Assessment) Mild  [R ankle]  Hair Reviewed  Eyes Unable to assess  Mouth Unable to assess  Skin Reviewed  Nails Reviewed   Diet Order:   Diet Order             Diet NPO time specified  Diet effective now                  EDUCATION NEEDS:  Not appropriate for education at this time  Skin:  Skin Assessment:  Reviewed RN Assessment (Ecchymosis)  Last BM:  PTA/unknown  Height:  Ht Readings from Last 1 Encounters:  11/21/20 6\' 3"  (1.905 m)   Weight:  Wt Readings from Last 1 Encounters:  11/21/20 111.6 kg   BMI:  Body mass index is 30.75 kg/m.  Estimated Nutritional Needs:  Kcal:  2260 Protein:  145-160 grams Fluid:  >2 L  2261, RD, LDN (she/her/hers) Registered Dietitian I After-Hours/Weekend Pager # in [place]

## 2020-11-22 NOTE — Consult Note (Addendum)
PHARMACY CONSULT NOTE  Pharmacy Consult for Electrolyte Monitoring and Replacement   Recent Labs: Potassium (mmol/L)  Date Value  11/22/2020 3.6   Magnesium (mg/dL)  Date Value  73/41/9379 2.4   Calcium (mg/dL)  Date Value  02/40/9735 8.1 (L)   Albumin (g/dL)  Date Value  32/99/2426 3.8   Phosphorus (mg/dL)  Date Value  83/41/9622 2.7   Sodium (mmol/L)  Date Value  11/22/2020 134 (L)   Assessment: Patient is a 55 y/o M with medical history including EtOH abuse who presented to the ED 9/19 with chief complaint of SOB, dark colored sputum, back pain and thumb pain. While in the ED, patient began experiencing hallucinations and encephalopathy. Patient ultimately required intubation 9/19. Patient is now admitted to the ICU where he remains intubated, sedated, and on mechanical ventilation. Pharmacy consulted to assist with electrolyte monitoring and replacement as indicated.  Nutrition: Tube feeds started 9/20  Goal of Therapy:  Electrolytes within normal limits  Plan:  --No electrolyte replacement warranted at this time --Mg & Phos are currently ordered for BID. Will go ahead and check a potassium with those labs this evening --Continue to follow  Tressie Ellis 11/22/2020 12:14 PM

## 2020-11-23 DIAGNOSIS — R4182 Altered mental status, unspecified: Secondary | ICD-10-CM

## 2020-11-23 LAB — PHOSPHORUS
Phosphorus: 2.9 mg/dL (ref 2.5–4.6)
Phosphorus: 2.8 mg/dL (ref 2.5–4.6)

## 2020-11-23 LAB — LEGIONELLA PNEUMOPHILA SEROGP 1 UR AG: L. pneumophila Serogp 1 Ur Ag: NEGATIVE

## 2020-11-23 LAB — GLUCOSE, CAPILLARY
Glucose-Capillary: 111 mg/dL — ABNORMAL HIGH (ref 70–99)
Glucose-Capillary: 122 mg/dL — ABNORMAL HIGH (ref 70–99)
Glucose-Capillary: 100 mg/dL — ABNORMAL HIGH (ref 70–99)
Glucose-Capillary: 105 mg/dL — ABNORMAL HIGH (ref 70–99)
Glucose-Capillary: 105 mg/dL — ABNORMAL HIGH (ref 70–99)
Glucose-Capillary: 128 mg/dL — ABNORMAL HIGH (ref 70–99)

## 2020-11-23 LAB — BASIC METABOLIC PANEL
Anion gap: 7 (ref 5–15)
BUN: 22 mg/dL — ABNORMAL HIGH (ref 6–20)
CO2: 29 mmol/L (ref 22–32)
Calcium: 8.1 mg/dL — ABNORMAL LOW (ref 8.9–10.3)
Chloride: 104 mmol/L (ref 98–111)
Creatinine, Ser: 0.7 mg/dL (ref 0.61–1.24)
GFR, Estimated: >60 mL/min (ref >60)
Glucose, Bld: 119 mg/dL — ABNORMAL HIGH (ref 70–99)
Potassium: 3.9 mmol/L (ref 3.5–5.1)
Sodium: 140 mmol/L (ref 135–145)

## 2020-11-23 LAB — MAGNESIUM
Magnesium: 3 mg/dL — ABNORMAL HIGH (ref 1.7–2.4)
Magnesium: 2.4 mg/dL (ref 1.7–2.4)

## 2020-11-23 MED ORDER — SODIUM CHLORIDE 0.9 % IV SOLN
200.0000 mg | Freq: Four times a day (QID) | INTRAVENOUS | Status: DC | PRN
  Administered 2020-11-23 – 2020-11-24 (×2): 200 mg via INTRAVENOUS
  Filled 2020-11-23 (×3): qty 2

## 2020-11-23 NOTE — Progress Notes (Signed)
NAME:  Seth Harper, MRN:  [id number], DOB:  11/16/1965, LOS: 2 ADMISSION DATE:  11/21/2020, CONSULTATION DATE:  11/21/20 REFERRING MD:  Dorothea Glassman, MD, CHIEF COMPLAINT:  Altered Mentation   History of Present Illness:  Seth Harper is a 55 year old male with history of alcohol abuse/dependence who presented to the ER on 9/19 with progressive shortness of breath, dark colored sputum, back pain and thumb pain. He started to have hallucinations with increasing altered mentation while in the ER. Further history obtained by the ER nursing staff from the patient's significant other who reports the patient fell last Friday and lost consciousness. She says he has not slept in days, usually drinks 1.5 gallons of rum per week and his last drink was on Friday prior to the fall. Due to his increasing agitation and altered mentation he was sedated and intubated in the ER. PCCM has been called to admit the patient.   Pertinent  Medical History  EtOH abuse/dependence  Significant Hospital Events: Including procedures, antibiotic start and stop dates in addition to other pertinent events   9/19 Intubated, admitted to the ICU.  LP unsuccessful in the ED.  CT MRI Harper and EEG negative 9/20 changed to PCV due to vent dyssynchrony, lactic acid improved  Interim History / Subjective:   Remains on the ventilator, sedated  Objective   Blood pressure (!) 151/80, pulse (!) 112, temperature (!) 100.8 F (38.2 C), resp. rate (!) 26, height 6\' 3"  (1.905 m), weight 111.6 kg, SpO2 94 %.    Vent Mode: PCV FiO2 (%):  [40 %] 40 % Set Rate:  [22 bmp] 22 bmp Vt Set:  [500 mL] 500 mL PEEP:  [2 cmH20-5 cmH20] 5 cmH20 Plateau Pressure:  [15 cmH20-18 cmH20] 18 cmH20   Intake/Output Summary (Last 24 hours) at 11/23/2020 0826 Last data filed at 11/23/2020 0600 Gross per 24 hour  Intake 2240.44 ml  Output 2150 ml  Net 90.44 ml   Filed Weights   11/21/20 1001  Weight: 111.6 kg    Examination: Blood pressure (!)  151/80, pulse (!) 112, temperature (!) 100.8 F (38.2 C), resp. rate (!) 26, height 6\' 3"  (1.905 m), weight 111.6 kg, SpO2 94 %. Gen:      No acute distress HEENT:  EOMI, sclera anicteric Neck:     No masses; no thyromegaly, ETT Lungs:    Clear to auscultation bilaterally; normal respiratory effort CV:         Regular rate and rhythm; no murmurs Abd:      + bowel sounds; soft, non-tender; no palpable masses, no distension Ext:    No edema; adequate peripheral perfusion Skin:      Warm and dry; no rash Neuro: Sedated  Lab/imaging reviewed Labs are stable  Resolved Hospital Problem list   Lactic Acidosis - resolved Anion Gap Metabolic Acidosis - resolved  Assessment & Plan:  Altered Mental Status Alcohol Withdrawal/Dependence Continue CIWA protocol Sedation with Versed, fentanyl propofol Attempt to wean propofol off Continue thiamine, folate  Acute Hypoxemic Respiratory Failure Emphysema Vent support, pressure support weans when mental status allows Follow intermittent chest x-ray DuoNeb  Sepsis present on admission secondary to community-acquired pneumonia Patient with fever, tachycardia, tachypnea, altered mentation and elevated WBC count with unkown source at this time. LP unsuccessful in the ER. Possibly related to withdrawal vs infection   CT reviewed with upper lobe groundglass opacities Continue ceftriaxone, azithromycin for community-acquired pneumonia coverage  L1 Compression Fracture s/p fall at home Conservative management,  pain control  Malnutrition due to chronic alcohol use Tube feeds per nutrition  Best Practice (right click and "Reselect all SmartList Selections" daily)   Diet/type: tubefeeds DVT prophylaxis: prophylactic heparin  GI prophylaxis: PPI Lines: N/A Foley:  N/A Code Status:  full code Last date of multidisciplinary goals of care discussion [N/A]  Critical care time:    The patient is critically ill with multiple organ system failure  and requires high complexity decision making for assessment and support, frequent evaluation and titration of therapies, advanced monitoring, review of radiographic studies and interpretation of complex data.   Critical Care Time devoted to patient care services, exclusive of separately billable procedures, described in this note is 35 minutes.   Chilton Greathouse MD [facility] See Amion for pager  If no response to pager , please call [phone] until 7pm After 7:00 pm call Elink  [phone] 11/23/2020, 8:26 AM

## 2020-11-23 NOTE — Consult Note (Signed)
PHARMACY CONSULT NOTE  Pharmacy Consult for Electrolyte Monitoring and Replacement   Recent Labs: Potassium (mmol/L)  Date Value  11/23/2020 3.9   Magnesium (mg/dL)  Date Value  64/40/3474 3.0 (H)   Calcium (mg/dL)  Date Value  25/95/6387 8.1 (L)   Albumin (g/dL)  Date Value  56/43/3295 3.8   Phosphorus (mg/dL)  Date Value  18/84/1660 2.9   Sodium (mmol/L)  Date Value  11/23/2020 140   Assessment: Patient is a 55 y/o M with medical history including EtOH abuse who presented to the ED 9/19 with chief complaint of SOB, dark colored sputum, back pain and thumb pain. While in the ED, patient began experiencing hallucinations and encephalopathy. Patient ultimately required intubation 9/19. Patient is now admitted to the ICU where he remains intubated, sedated, and on mechanical ventilation. Pharmacy consulted to assist with electrolyte monitoring and replacement as indicated.  Nutrition: Tube feeds started 9/20  Goal of Therapy:  Electrolytes within normal limits  Plan:  --No electrolyte replacement warranted at this time --Continue to follow  Pricilla Riffle, PharmD, BCPS Clinical Pharmacist 11/23/2020 2:14 PM

## 2020-11-24 ENCOUNTER — Inpatient Hospital Stay: Payer: Medicaid Other

## 2020-11-24 DIAGNOSIS — G9340 Encephalopathy, unspecified: Secondary | ICD-10-CM

## 2020-11-24 DIAGNOSIS — R509 Fever, unspecified: Secondary | ICD-10-CM

## 2020-11-24 DIAGNOSIS — J690 Pneumonitis due to inhalation of food and vomit: Secondary | ICD-10-CM

## 2020-11-24 LAB — BASIC METABOLIC PANEL
Anion gap: 8 (ref 5–15)
BUN: 22 mg/dL — ABNORMAL HIGH (ref 6–20)
CO2: 31 mmol/L (ref 22–32)
Calcium: 8.2 mg/dL — ABNORMAL LOW (ref 8.9–10.3)
Chloride: 101 mmol/L (ref 98–111)
Creatinine, Ser: 0.78 mg/dL (ref 0.61–1.24)
GFR, Estimated: >60 mL/min (ref >60)
Glucose, Bld: 121 mg/dL — ABNORMAL HIGH (ref 70–99)
Potassium: 4.1 mmol/L (ref 3.5–5.1)
Sodium: 140 mmol/L (ref 135–145)

## 2020-11-24 LAB — GLUCOSE, CAPILLARY
Glucose-Capillary: 123 mg/dL — ABNORMAL HIGH (ref 70–99)
Glucose-Capillary: 123 mg/dL — ABNORMAL HIGH (ref 70–99)
Glucose-Capillary: 111 mg/dL — ABNORMAL HIGH (ref 70–99)
Glucose-Capillary: 109 mg/dL — ABNORMAL HIGH (ref 70–99)
Glucose-Capillary: 122 mg/dL — ABNORMAL HIGH (ref 70–99)

## 2020-11-24 LAB — CBC WITH DIFFERENTIAL/PLATELET
Abs Immature Granulocytes: 0.16 10*3/uL — ABNORMAL HIGH (ref 0.00–0.07)
Basophils Absolute: 0.1 10*3/uL (ref 0.0–0.1)
Basophils Relative: 1 %
Eosinophils Absolute: 0 10*3/uL (ref 0.0–0.5)
Eosinophils Relative: 0 %
HCT: 40.9 % (ref 39.0–52.0)
Hemoglobin: 13.7 g/dL (ref 13.0–17.0)
Immature Granulocytes: 2 %
Lymphocytes Relative: 18 %
Lymphs Abs: 1.6 10*3/uL (ref 0.7–4.0)
MCH: 36.1 pg — ABNORMAL HIGH (ref 26.0–34.0)
MCHC: 33.5 g/dL (ref 30.0–36.0)
MCV: 107.9 fL — ABNORMAL HIGH (ref 80.0–100.0)
Monocytes Absolute: 1.4 10*3/uL — ABNORMAL HIGH (ref 0.1–1.0)
Monocytes Relative: 15 %
Neutro Abs: 5.8 10*3/uL (ref 1.7–7.7)
Neutrophils Relative %: 64 %
Platelets: 173 10*3/uL (ref 150–400)
RBC: 3.79 MIL/uL — ABNORMAL LOW (ref 4.22–5.81)
RDW: 14 % (ref 11.5–15.5)
WBC: 9.2 10*3/uL (ref 4.0–10.5)
nRBC: 0 % (ref 0.0–0.2)

## 2020-11-24 LAB — MAGNESIUM: Magnesium: 2.3 mg/dL (ref 1.7–2.4)

## 2020-11-24 LAB — CBC
HCT: 40.8 % (ref 39.0–52.0)
Hemoglobin: 13.8 g/dL (ref 13.0–17.0)
MCH: 36.5 pg — ABNORMAL HIGH (ref 26.0–34.0)
MCHC: 33.8 g/dL (ref 30.0–36.0)
MCV: 107.9 fL — ABNORMAL HIGH (ref 80.0–100.0)
Platelets: 156 10*3/uL (ref 150–400)
RBC: 3.78 MIL/uL — ABNORMAL LOW (ref 4.22–5.81)
RDW: 14 % (ref 11.5–15.5)
WBC: 9.1 10*3/uL (ref 4.0–10.5)
nRBC: 0 % (ref 0.0–0.2)

## 2020-11-24 LAB — LACTIC ACID, PLASMA
Lactic Acid, Venous: 0.9 mmol/L (ref 0.5–1.9)
Lactic Acid, Venous: 1 mmol/L (ref 0.5–1.9)

## 2020-11-24 LAB — PHOSPHORUS: Phosphorus: 4.4 mg/dL (ref 2.5–4.6)

## 2020-11-24 LAB — PROCALCITONIN: Procalcitonin: 3.7 ng/mL

## 2020-11-24 IMAGING — US US EXTREM LOW VENOUS
1 series · 13 of 24 positions shown · non-contrast
Comparison: None.

CLINICAL DATA: 54-year-old male with a history of edema



[Series 1: us venous img lower bilat (dvt) · portal-venous · 67 acquisitions, 13 frames shown]
[im 1/67]
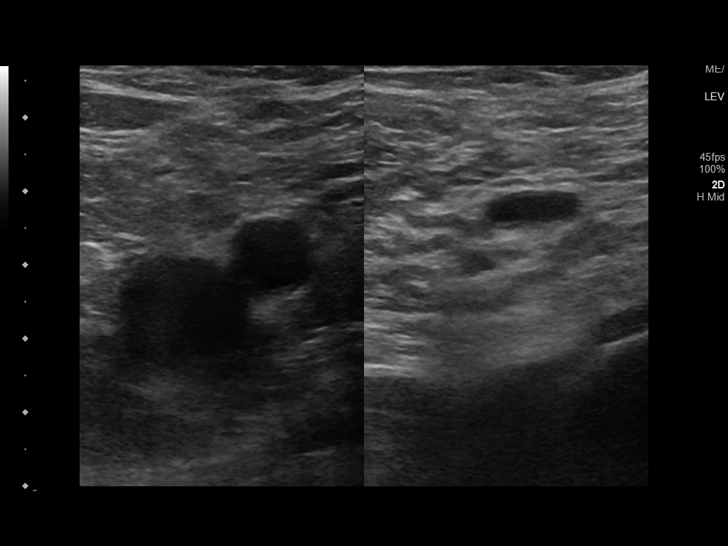
[im 6/67]
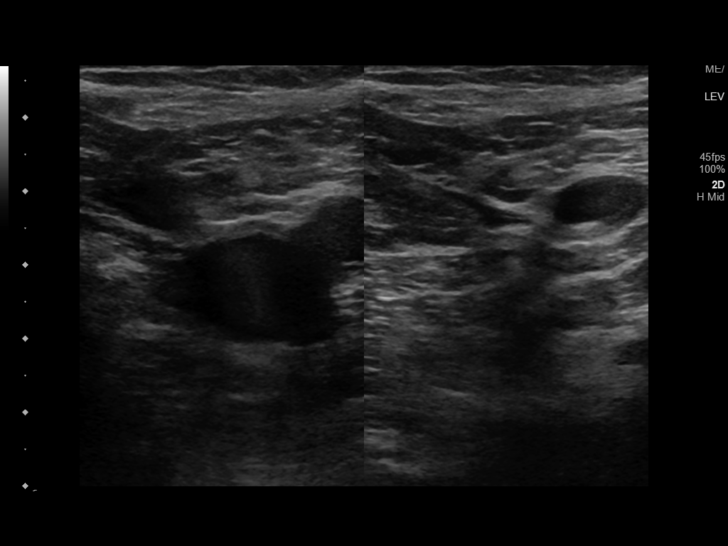
[im 12/67]
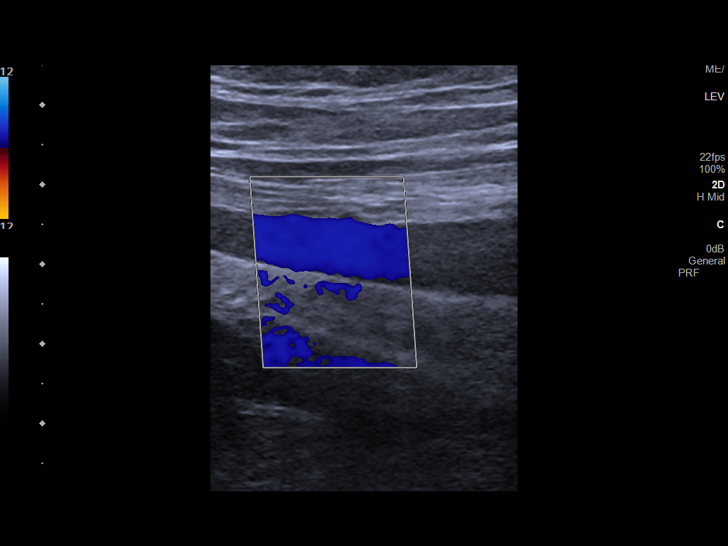
[im 15/67]
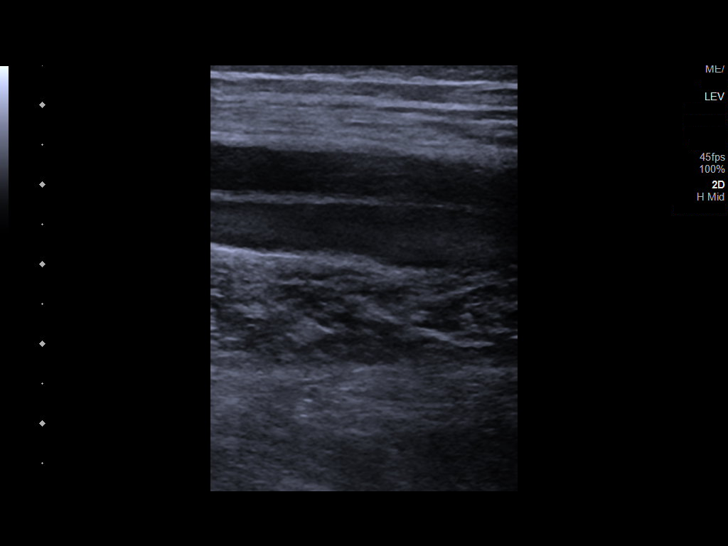
[im 21/67]
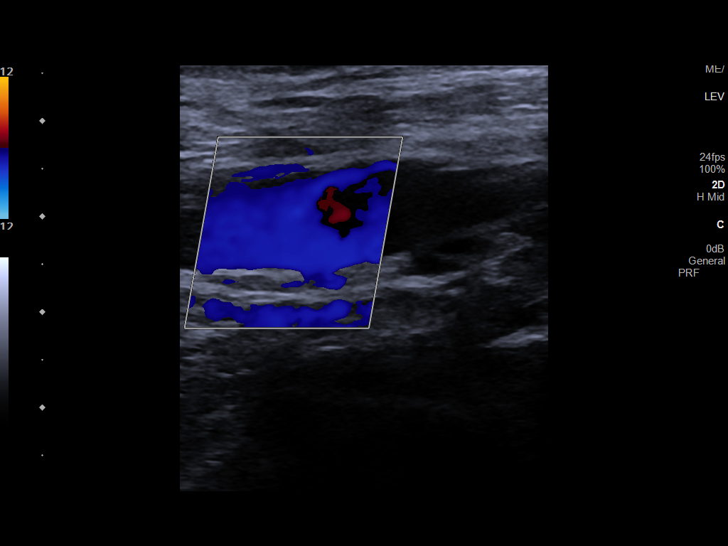
[im 26/67]
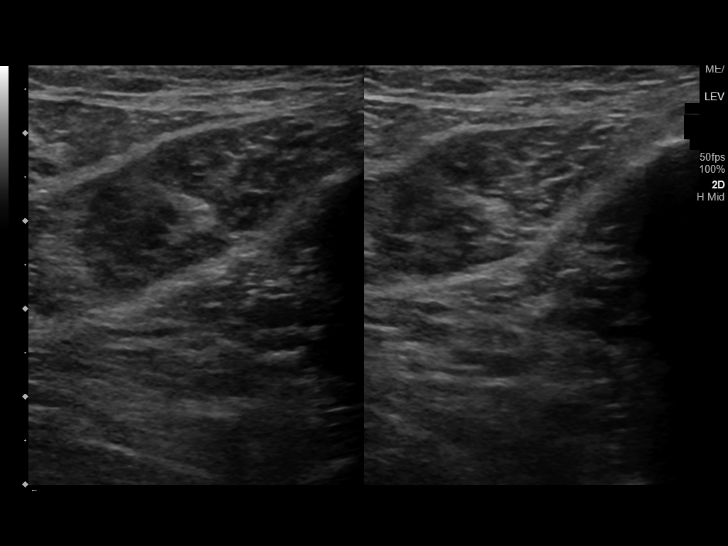
[im 35/67]
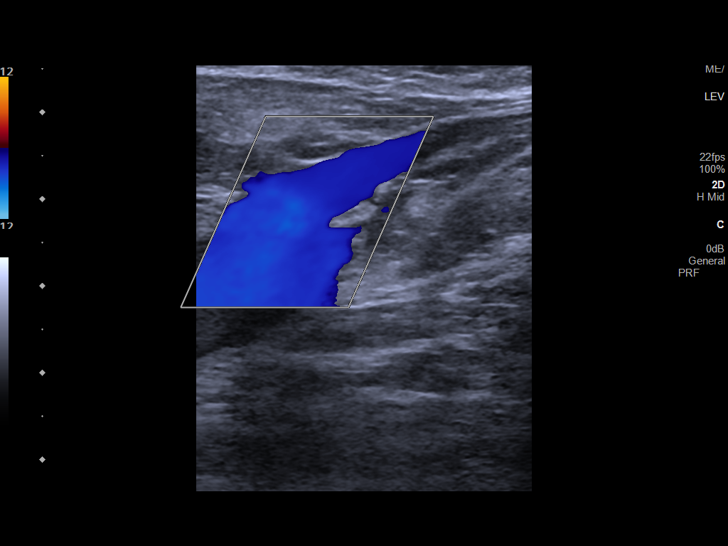
[im 38/67]
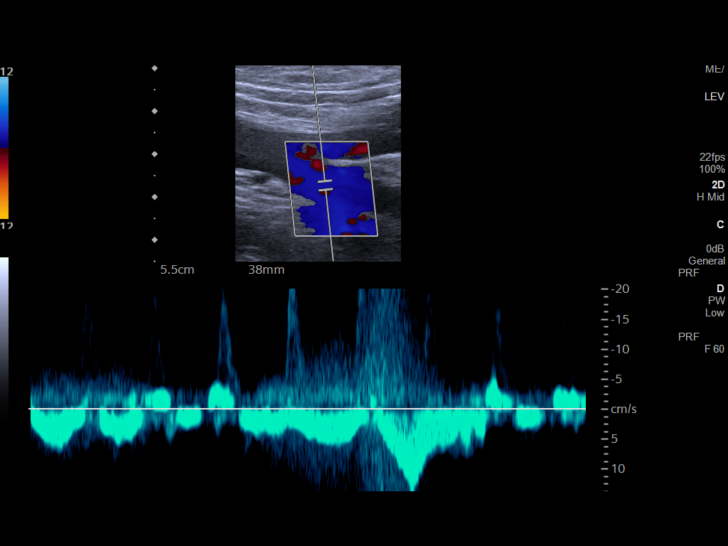
[im 44/67]
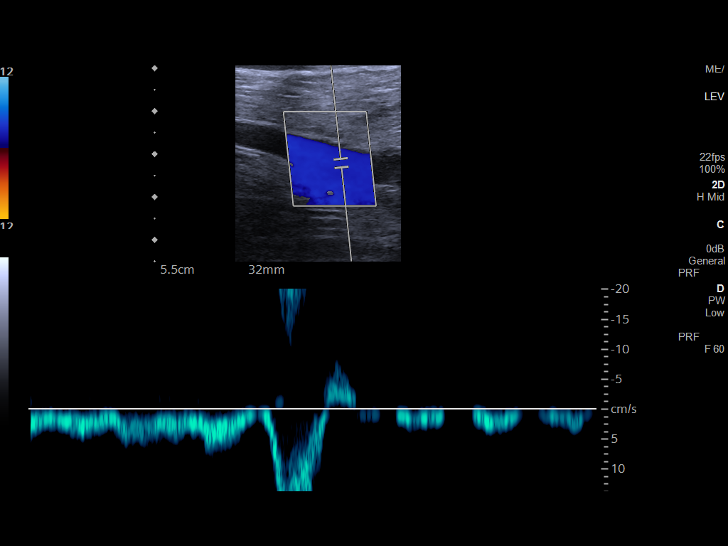
[im 49/67]
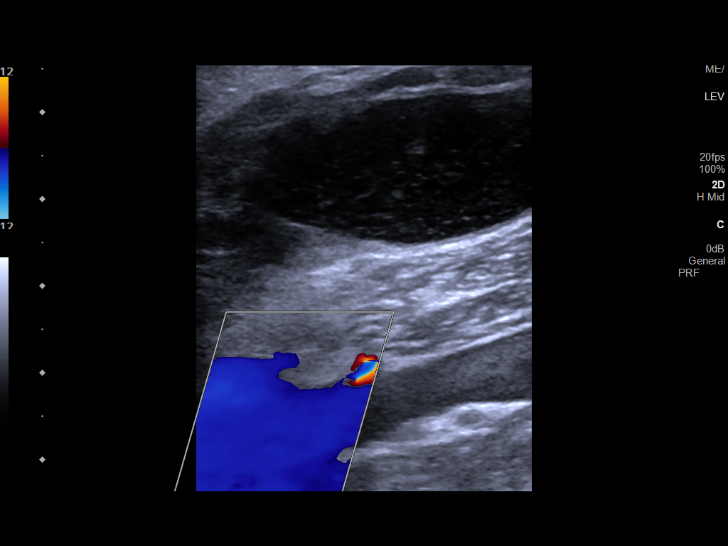
[im 55/67]
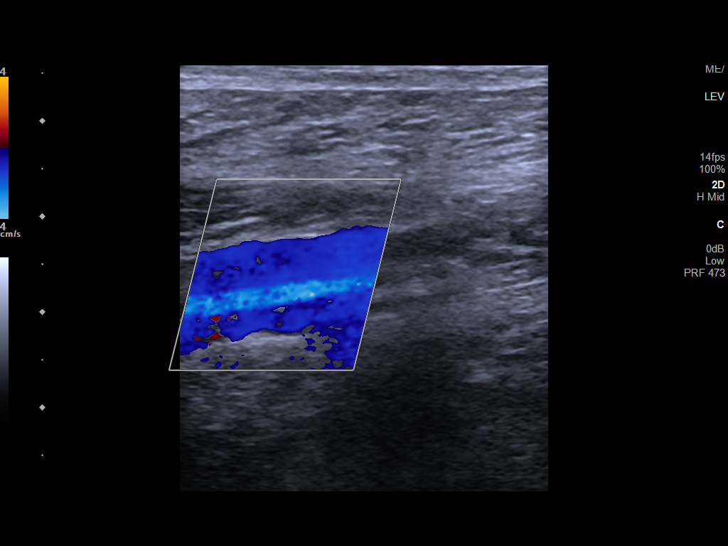
[im 61/67]
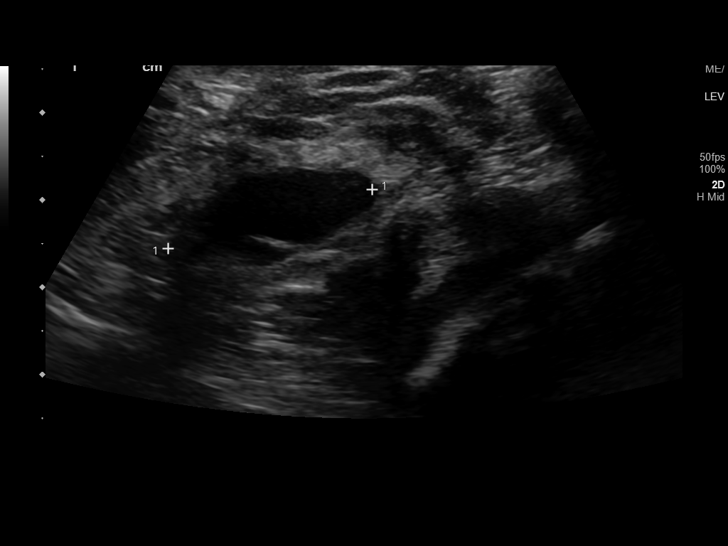
[im 67/67]
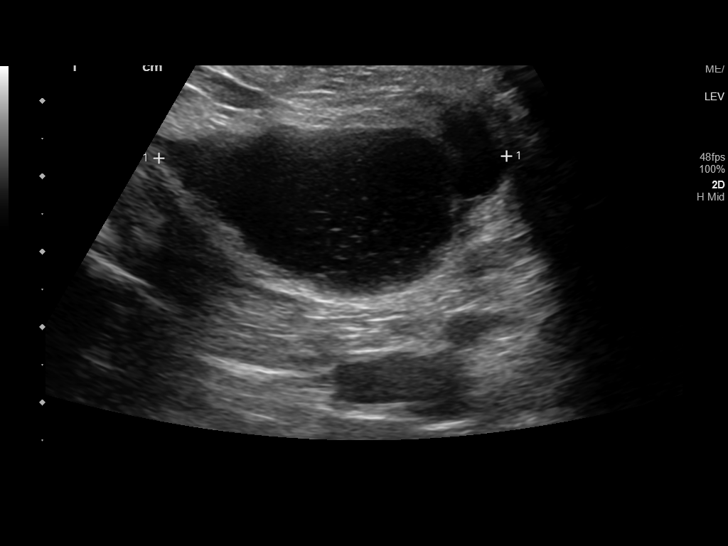

[13 of 24 positions shown; findings below may reference images not displayed]

FINDINGS: RIGHT LOWER EXTREMITY

Common Femoral Vein: No evidence of thrombus. Normal
compressibility, respiratory phasicity and response to augmentation.

Saphenofemoral Junction: No evidence of thrombus. Normal
compressibility and flow on color Doppler imaging.

Profunda Femoral Vein: No evidence of thrombus. Normal
compressibility and flow on color Doppler imaging.

Femoral Vein: No evidence of thrombus. Normal compressibility,
respiratory phasicity and response to augmentation.

Popliteal Vein: No evidence of thrombus. Normal compressibility,
respiratory phasicity and response to augmentation.

Calf Veins: No evidence of thrombus. Normal compressibility and flow
on color Doppler imaging.

Superficial Great Saphenous Vein: No evidence of thrombus. Normal
compressibility and flow on color Doppler imaging.

Other Findings: Lentiform fluid collection in the popliteal fossa,
estimated 8.0 cm x 2.3 cm compatible with Baker's cyst

LEFT LOWER EXTREMITY

Common Femoral Vein: No evidence of thrombus. Normal
compressibility, respiratory phasicity and response to augmentation.

Saphenofemoral Junction: No evidence of thrombus. Normal
compressibility and flow on color Doppler imaging.

Profunda Femoral Vein: No evidence of thrombus. Normal
compressibility and flow on color Doppler imaging.

Femoral Vein: No evidence of thrombus. Normal compressibility,
respiratory phasicity and response to augmentation.

Popliteal Vein: No evidence of thrombus. Normal compressibility,
respiratory phasicity and response to augmentation.

Calf Veins: No evidence of thrombus. Normal compressibility and flow
on color Doppler imaging.

Superficial Great Saphenous Vein: No evidence of thrombus. Normal
compressibility and flow on color Doppler imaging.

Other Findings:  None.
IMPRESSION: Sonographic survey of the bilateral lower extremities negative for
DVT

Right Baker's cyst

## 2020-11-24 IMAGING — DX DG ABDOMEN 1V
1 series · 1 of 1 positions shown · non-contrast
Comparison: None.

CLINICAL DATA: OG tube placement

EXAM:
ABDOMEN - 1 VIEW

[abdomen supine]
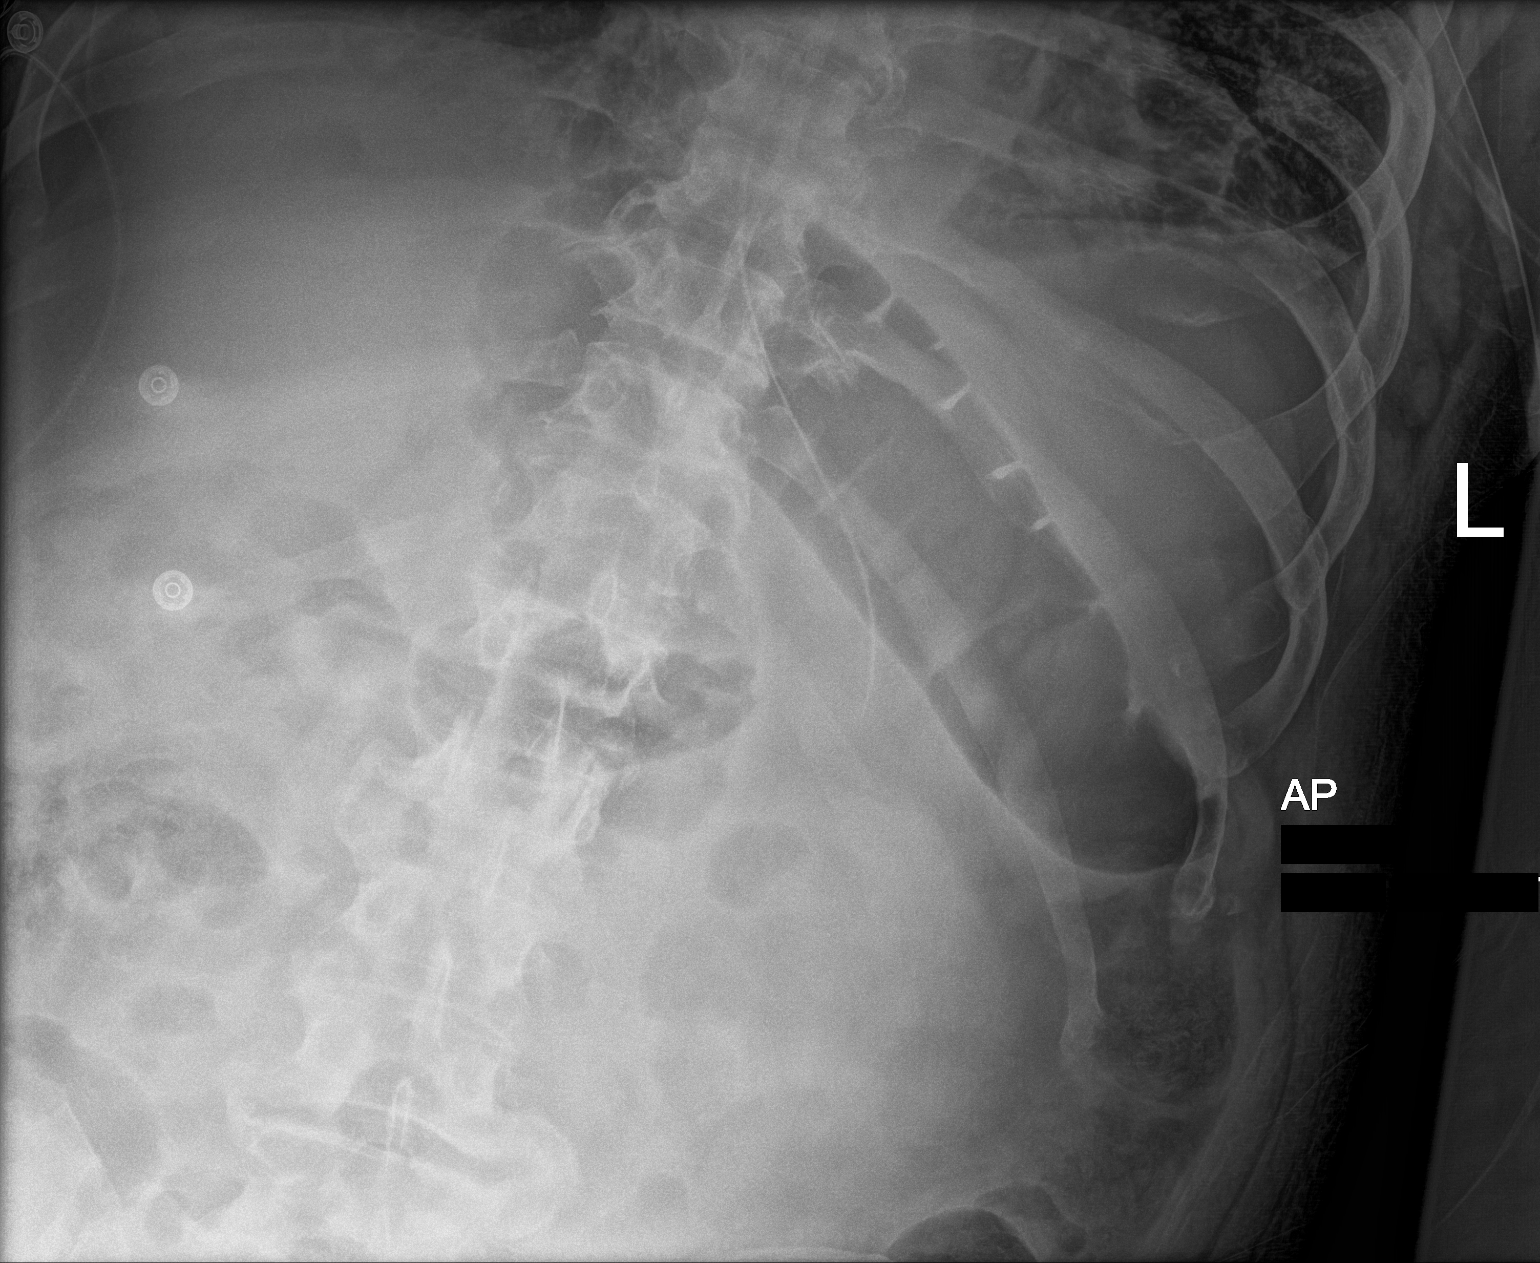

[1 of 1 positions shown; findings below may reference images not displayed]

FINDINGS: Esophagogastric tube with tip and side port below the diaphragm.
Loop of distended colon in the left upper quadrant measuring 6.0 cm
in caliber. No free air in the abdomen on supine radiograph.
IMPRESSION: 1. Esophagogastric tube with tip and side port below the diaphragm.
2. Loop of distended colon in the left upper quadrant measuring
cm in caliber, consistent with ileus or obstruction. No free air in
the abdomen on supine radiograph.

## 2020-11-24 MED ORDER — SODIUM CHLORIDE 0.9 % IV SOLN
2.0000 g | Freq: Three times a day (TID) | INTRAVENOUS | Status: DC
  Administered 2020-11-24 – 2020-11-29 (×15): 2 g via INTRAVENOUS
  Filled 2020-11-24 (×19): qty 2

## 2020-11-24 MED ORDER — SODIUM CHLORIDE 0.9 % IV SOLN: INTRAVENOUS | Status: DC

## 2020-11-24 MED ORDER — LIDOCAINE HCL (PF) 1 % IJ SOLN
10.0000 mL | Freq: Once | INTRAMUSCULAR | Status: AC
  Administered 2020-11-24: 10 mL via INTRADERMAL
  Filled 2020-11-24: qty 10

## 2020-11-24 MED ORDER — METOCLOPRAMIDE HCL 5 MG/ML IJ SOLN
10.0000 mg | Freq: Three times a day (TID) | INTRAMUSCULAR | Status: DC
  Administered 2020-11-24 – 2020-11-26 (×6): 10 mg via INTRAVENOUS
  Filled 2020-11-24 (×6): qty 2

## 2020-11-24 MED ORDER — THIAMINE HCL 100 MG/ML IJ SOLN
100.0000 mg | Freq: Every day | INTRAMUSCULAR | Status: DC
  Administered 2020-11-25 – 2020-12-06 (×12): 100 mg via INTRAVENOUS
  Filled 2020-11-24 (×12): qty 2

## 2020-11-24 MED ORDER — DEXTROSE 5 % IV SOLN
10.0000 mg/kg | Freq: Three times a day (TID) | INTRAVENOUS | Status: DC
  Administered 2020-11-25 – 2020-11-29 (×14): 955 mg via INTRAVENOUS
  Filled 2020-11-24 (×17): qty 19.1

## 2020-11-24 NOTE — Progress Notes (Signed)
OG tube found to be at 53cm. Tube feeds running and no KUB was done to confirm placement. NP notified, feeds stopped  and tube advanced to 60cm. A KUB was ordered and results pending. Will continue to monitor.

## 2020-11-24 NOTE — Consult Note (Signed)
Pharmacy Antibiotic Note  Seth Harper is a 55 y.o. male with medical history including EtOH abuse admitted on 11/21/2020 with  EtOH withdrawal, back pain, thumb pain, productive dark colored sputum . Patient was intubated 9/19 and remains intubated, sedated, and on mechanical ventilation in the ICU. Patient was started on CAP regimen with ceftriaxone and azithromycin for suspected pneumonia but continues to have fevers despite therapy. ID has now been consulted and broad differential is being considered including meningitis. Pharmacy has been consulted for acyclovir dosing for HSV encephalitis coverage. Ceftriaxone was broadened to meropenem 9/22. Scheduled to complete Z-pak tomorrow.  Leukocytosis has resolved. Procalcitonin is down-trending. Persistently febrile. Tachycardic. LP performed 9/22. Follow-up cytology / cultures.  Plan:  Acyclovir 955 mg IV q8h (10 mg/kg AdjBw)  Height: 6\' 3"  (190.5 cm) Weight: 111.6 kg (246 lb) IBW/kg (Calculated) : 84.5  Temp (24hrs), Avg:100.8 F (38.2 C), Min:99.9 F (37.7 C), Max:101.5 F (38.6 C)  Recent Labs  Lab 11/21/20 1007 11/21/20 1015 11/21/20 1320 11/21/20 2105 11/21/20 2349 11/22/20 0355 11/23/20 0458 11/24/20 0511 11/24/20 1434 11/24/20 1726  WBC 15.6*  --   --   --   --  10.3  --  9.2  9.1  --   --   CREATININE 1.08  --   --   --   --  0.79 0.70 0.78  --   --   LATICACIDVEN  --    < > 3.0* 1.5 0.9  --   --   --  0.9 1.0   < > = values in this interval not displayed.    Estimated Creatinine Clearance: 143.9 mL/min (by C-G formula based on SCr of 0.78 mg/dL).    No Known Allergies  Antimicrobials this admission: Vancomycin 9/19 x 1 Ceftriaxone 9/19 >> 9/22 Azithromycin 9/19 >> (9/23) Meropenem 9/22 >>  Acyclovir 9/22 >>   Dose adjustments this admission: N/A  Microbiology results: 9/22 LP: Pending 9/22 Tracheal aspirate: Polymicrobial Gm stain. Pending  9/19 BCx: NGTD 9/19 MRSA PCR: (-)  Thank you for allowing  pharmacy to be a part of this patient's care.  10/19 11/24/2020 10:49 PM

## 2020-11-24 NOTE — Consult Note (Signed)
NAME: Seth Harper  DOB: Feb 27, 1966  MRN: [id number]  Date/Time: 11/24/2020 2:21 PM  REQUESTING PROVIDER: Darlyn Chamber Subjective:  REASON FOR CONSULT: persistent fever and altered mental status ?No history from patient-  Seth Harper is a 55 y.o. male with a history of alcohol abuse COPD presented on 9/19 brought in by EMS for diaphoresis, sob,dark sputum and confusion- as per his partner pt has had multiple falls-a week ago, he fell hehe apparently had a fall on Friday and has been c/o low back pain and thumb pain As per EMS note while they were transporting him he was disoriented and yelling that his grandma was going to hit the tree with her wheel chair In the ED vitals initially was 98.6, 141/95, HR 150 and RR 31 and sats 93%. As per the ED physician note he was awake and alert ,following commands but was easily confused. He was c/o SOB, coughing up black phlegm, being lightheaded, falling and hurting his back. Xray lumbar spine done at 11.32 showed Acute superior endplate fracture at L1 with loss of height anteriorly of 20%. CXR was normal CT head and cervical spine done at 11.45 am was unremarkable.CT abd and pelvis and CT was not significant By the course of a few hours between 9.30- 4.30 pm he became agitated and increasingly confused and was given multiple doses of ativan/ versed and eventually sedated with propafol and intubated  and was intubated. 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 The pain was severe and he told her that he may have broken his ribs- He started taking Advil many tablets a day. He did not go to work after that. Friday he was drinking at home- On Saturday he was coughing and that hurt his rib  and he fell back straight on the floor and hurt his back. He did not drink Saturday and he started shaking a lot- the wife did not know it was Dts. She called ambulance but he refused to go to the hospital and so she canceled them. On Sunday he lay in bed but was screaming in pain and shaking , so on Monday she called EMS He was sweating a lot but they did not check his temperature He has been constipated for more than a week. No travel No known tick bites No consumption of raw sea food.     Past Medical History:  Diagnosis Date   Asthma     PSH Nodule removed from his throat  Social History   Socioeconomic History   Marital status: Single    Spouse name: Not on file   Number of children: Not on file   Years of education: Not on file   Highest education level: Not on file  Occupational History   Not on file  Tobacco Use   Smoking status: Every Day    Types: E-cigarettes   Smokeless tobacco: Never  Substance and Sexual Activity   Alcohol use: Yes   Drug use: Yes    Types: Marijuana   Sexual activity: Not on file  Other Topics Concern   Not on file  Social History Narrative   Not on file   Social Determinants  of Health   Financial Resource Strain: Not on file  Food Insecurity: Not on file  Transportation Needs: Not on file  Physical Activity: Not on file  Stress: Not on file  Social Connections: Not on file  Intimate Partner Violence: Not on file    FH; mother DM  No Known Allergies I? Current Facility-Administered Medications  Medication Dose Route Frequency Provider Last Rate Last Admin   acetaminophen (TYLENOL) tablet 650 mg  650 mg Per Tube Q6H PRN Rust-Chester, Britton L, NP   650 mg at 11/24/20 1008   albuterol (PROVENTIL) (2.5 MG/3ML) 0.083% nebulizer solution 2.5 mg  2.5 mg Nebulization Q4H PRN Freddi Starr, MD       azithromycin (ZITHROMAX) tablet 250 mg  250 mg Per Tube Daily Freda Jackson B, MD   250 mg at 11/23/20 1431   cefTRIAXone  (ROCEPHIN) 2 g in sodium chloride 0.9 % 100 mL IVPB  2 g Intravenous Q24H Freddi Starr, MD   Stopped at 11/23/20 1659   chlorhexidine gluconate (MEDLINE KIT) (PERIDEX) 0.12 % solution 15 mL  15 mL Mouth Rinse BID Freda Jackson B, MD   15 mL at 11/24/20 0800   Chlorhexidine Gluconate Cloth 2 % PADS 6 each  6 each Topical Q0600 Freddi Starr, MD   6 each at 11/24/20 0300   docusate (COLACE) 50 MG/5ML liquid 100 mg  100 mg Per Tube BID Freda Jackson B, MD   100 mg at 11/24/20 1004   feeding supplement (PROSource TF) liquid 90 mL  90 mL Per Tube TID Freddi Starr, MD   90 mL at 11/24/20 1008   feeding supplement (VITAL 1.5 CAL) liquid 1,000 mL  1,000 mL Per Tube Continuous Freddi Starr, MD 55 mL/hr at 11/24/20 0600 Infusion Verify at 11/24/20 0600   fentaNYL (SUBLIMAZE) bolus via infusion 50-100 mcg  50-100 mcg Intravenous Q15 min PRN Freddi Starr, MD       fentaNYL 2544mg in NS 2589m(1016mml) infusion-PREMIX  50-200 mcg/hr Intravenous Continuous DewFreddi StarrD 15 mL/hr at 11/24/20 0900 150 mcg/hr at 11/08/24/9408546folic acid (FOLVITE) tablet 1 mg  1 mg Per Tube Daily DewFreda Jackson MD   1 mg at 11/24/20 1004   free water 30 mL  30 mL Per Tube Q4H DewFreda Jackson MD   30 mL at 11/24/20 1200   heparin injection 5,000 Units  5,000 Units Subcutaneous Q8H DewFreda Jackson MD   5,000 Units at 11/24/20 1334   ibuprofen (CALDOLOR) 200 mg in sodium chloride 0.9 % 100 mL IVPB  200 mg Intravenous Q6H PRN Mannam, PraHart RobinsonsD   Stopped at 11/24/20 0718   ipratropium-albuterol (DUONEB) 0.5-2.5 (3) MG/3ML nebulizer solution 3 mL  3 mL Nebulization Q6H DewFreddi StarrD   3 mL at 11/24/20 1331   MEDLINE mouth rinse  15 mL Mouth Rinse 10 times per day DewFreda Jackson MD   15 mL at 11/24/20 1334   metoCLOPramide (REGLAN) injection 10 mg  10 mg Intravenous Q8H KeeDarel Hong NP       midazolam (VERSED) 100 mg/100 mL (1 mg/mL) premix infusion  0.5-10  mg/hr Intravenous Continuous MalNena PolioD 10 mL/hr at 11/24/20 0900 10 mg/hr at 11/24/20 0900   midazolam (VERSED) injection 2 mg  2 mg Intravenous Q15 min PRN DewFreddi StarrD       midazolam (VERSED) injection 2 mg  2 mg Intravenous Q2H PRN  Freddi Starr, MD       multivitamin with minerals tablet 1 tablet  1 tablet Per Tube Daily Freddi Starr, MD   1 tablet at 11/24/20 1004   pantoprazole (PROTONIX) injection 40 mg  40 mg Intravenous QHS Freddi Starr, MD   40 mg at 11/23/20 2101   polyethylene glycol (MIRALAX / GLYCOLAX) packet 17 g  17 g Per Tube Daily Freddi Starr, MD   17 g at 11/24/20 1004   propofol (DIPRIVAN) 1000 MG/100ML infusion  5-80 mcg/kg/min Intravenous Continuous Nena Polio, MD 5.36 mL/hr at 11/24/20 0900 8 mcg/kg/min at 11/24/20 0900   sodium chloride 0.9 % 50 mL with thiamine (B-1) 500 mg infusion   Intravenous Q8H Freddi Starr, MD 50 mL/hr at 11/24/20 0523 New Bag at 11/24/20 0523   [START ON 11/25/2020] thiamine (B-1) injection 100 mg  100 mg Intravenous Daily Mannam, Praveen, MD         Abtx:  Anti-infectives (From admission, onward)    Start     Dose/Rate Route Frequency Ordered Stop   11/22/20 1600  cefTRIAXone (ROCEPHIN) 2 g in sodium chloride 0.9 % 100 mL IVPB        2 g 200 mL/hr over 30 Minutes Intravenous Every 24 hours 11/22/20 0942     11/22/20 1500  azithromycin (ZITHROMAX) 250 mg in dextrose 5 % 125 mL IVPB  Status:  Discontinued        250 mg 125 mL/hr over 60 Minutes Intravenous Every 24 hours 11/22/20 0942 11/22/20 1119   11/22/20 1400  azithromycin (ZITHROMAX) tablet 250 mg        250 mg Per Tube Daily 11/22/20 1119 11/26/20 1359   11/21/20 1445  cefTRIAXone (ROCEPHIN) 2 g in sodium chloride 0.9 % 100 mL IVPB        2 g 200 mL/hr over 30 Minutes Intravenous  Once 11/21/20 1444 11/21/20 1526   11/21/20 1445  vancomycin (VANCOCIN) IVPB 1000 mg/200 mL premix        1,000 mg 200 mL/hr over 60 Minutes  Intravenous  Once 11/21/20 1444 11/21/20 1803   11/21/20 1445  azithromycin (ZITHROMAX) 500 mg in sodium chloride 0.9 % 250 mL IVPB        500 mg 250 mL/hr over 60 Minutes Intravenous  Once 11/21/20 1444 11/21/20 1656       REVIEW OF SYSTEMS:  Const: negative fever, negative chills, negative weight loss Eyes: negative diplopia or visual changes, negative eye pain ENT: negative coryza, negative sore throat Resp: negative cough, hemoptysis, dyspnea Cards: negative for chest pain, palpitations, lower extremity edema GU: negative for frequency, dysuria and hematuria GI: Negative for abdominal pain, diarrhea, bleeding, constipation Skin: negative for rash and pruritus Heme: negative for easy bruising and gum/nose bleeding MS: negative for myalgias, arthralgias, back pain and muscle weakness Neurolo:negative for headaches, dizziness, vertigo, memory problems  Psych: negative for feelings of anxiety, depression  Endocrine: negative for thyroid, diabetes Allergy/Immunology- negative for any medication or food allergies ? Pertinent Positives include : Objective:  VITALS:  BP (!) 146/84   Pulse (!) 111   Temp (!) 101.1 F (38.4 C)   Resp (!) 22   Ht _0  (1.905 m)   Wt 111.6 kg   SpO2 95%   BMI 30.75 kg/m  PHYSICAL EXAM:  General: Alert, cooperative, no distress, appears stated age.  Head: Normocephalic, without obvious abnormality, atraumatic. Eyes: Conjunctivae clear, anicteric sclerae. Pupils are equal ENT Nares normal. No  drainage or sinus tenderness. Lips, mucosa, and tongue normal. No Thrush Neck: Supple, symmetrical, no adenopathy, thyroid: non tender no carotid bruit and no JVD. Back: No CVA tenderness. Lungs: Clear to auscultation bilaterally. No Wheezing or Rhonchi. No rales. Heart: Regular rate and rhythm, no murmur, rub or gallop. Abdomen: Soft, non-tender,not distended. Bowel sounds normal. No masses Extremities: atraumatic, no cyanosis. No edema. No  clubbing Skin: No rashes or lesions. Or bruising Lymph: Cervical, supraclavicular normal. Neurologic: Grossly non-focal Pertinent Labs Lab Results CBC    Component Value Date/Time   WBC 9.1 11/24/2020 0511   WBC 9.2 11/24/2020 0511   RBC 3.78 (L) 11/24/2020 0511   RBC 3.79 (L) 11/24/2020 0511   HGB 13.8 11/24/2020 0511   HGB 13.7 11/24/2020 0511   HCT 40.8 11/24/2020 0511   HCT 40.9 11/24/2020 0511   PLT 156 11/24/2020 0511   PLT 173 11/24/2020 0511   MCV 107.9 (H) 11/24/2020 0511   MCV 107.9 (H) 11/24/2020 0511   MCH 36.5 (H) 11/24/2020 0511   MCH 36.1 (H) 11/24/2020 0511   MCHC 33.8 11/24/2020 0511   MCHC 33.5 11/24/2020 0511   RDW 14.0 11/24/2020 0511   RDW 14.0 11/24/2020 0511   LYMPHSABS 1.6 11/24/2020 0511   MONOABS 1.4 (H) 11/24/2020 0511   EOSABS 0.0 11/24/2020 0511   BASOSABS 0.1 11/24/2020 0511    CMP Latest Ref Rng & Units 11/24/2020 11/23/2020 11/22/2020  Glucose 70 - 99 mg/dL 121(H) 119(H) -  BUN 6 - 20 mg/dL 22(H) 22(H) -  Creatinine 0.61 - 1.24 mg/dL 0.78 0.70 -  Sodium 135 - 145 mmol/L 140 140 -  Potassium 3.5 - 5.1 mmol/L 4.1 3.9 3.7  Chloride 98 - 111 mmol/L 101 104 -  CO2 22 - 32 mmol/L 31 29 -  Calcium 8.9 - 10.3 mg/dL 8.2(L) 8.1(L) -  Total Protein 6.5 - 8.1 g/dL - - -  Total Bilirubin 0.3 - 1.2 mg/dL - - -  Alkaline Phos 38 - 126 U/L - - -  AST 15 - 41 U/L - - -  ALT 0 - 44 U/L - - -      Microbiology: Recent Results (from the past 240 hour(s))  SARS CORONAVIRUS 2 (TAT 6-24 HRS) Nasopharyngeal Nasopharyngeal Swab     Status: None   Collection Time: 11/21/20 10:07 AM   Specimen: Nasopharyngeal Swab  Result Value Ref Range Status   SARS Coronavirus 2 NEGATIVE NEGATIVE Final    Comment: (NOTE) SARS-CoV-2 target nucleic acids are NOT DETECTED.  The SARS-CoV-2 RNA is generally detectable in upper and lower respiratory specimens during the acute phase of infection. Negative results do not preclude SARS-CoV-2 infection, do not rule  out co-infections with other pathogens, and should not be used as the sole basis for treatment or other patient management decisions. Negative results must be combined with clinical observations, patient history, and epidemiological information. The expected result is Negative.  Fact Sheet for Patients: SugarRoll.be  Fact Sheet for Healthcare Providers: https://www.woods-mathews.com/  This test is not yet approved or cleared by the [place] FDA and  has been authorized for detection and/or diagnosis of SARS-CoV-2 by FDA under an Emergency Use Authorization (EUA). This EUA will remain  in effect (meaning this test can be used) for the duration of the COVID-19 declaration under Se ction 564(b)(1) of the Act, 21 U.S.C. section 360bbb-3(b)(1), unless the authorization is terminated or revoked sooner.  Performed at [facility], [place] [address], [place], [place] [address]  Culture, blood (routine x 2)     Status: None (Preliminary result)   Collection Time: 11/21/20  9:05 PM   Specimen: BLOOD  Result Value Ref Range Status   Specimen Description BLOOD RIGHT HAND  Final   Special Requests   Final    BOTTLES DRAWN AEROBIC AND ANAEROBIC Blood Culture results may not be optimal due to an inadequate volume of blood received in culture bottles   Culture   Final    NO GROWTH 3 DAYS Performed at [facility], [address] [place], [place] [address]    Report Status PENDING  Incomplete  Culture, blood (routine x 2)     Status: None (Preliminary result)   Collection Time: 11/21/20  9:05 PM   Specimen: BLOOD  Result Value Ref Range Status   Specimen Description BLOOD RIGHT ASSIST CONTROL  Final   Special Requests   Final    BOTTLES DRAWN AEROBIC AND ANAEROBIC Blood Culture adequate volume   Culture   Final    NO GROWTH 3 DAYS Performed at [facility], [address], [place], [place] [address]    Report Status  PENDING  Incomplete  Resp Panel by RT-PCR (Flu A&B, Covid) Nasopharyngeal Swab     Status: None   Collection Time: 11/21/20  9:06 PM   Specimen: Nasopharyngeal Swab; Nasopharyngeal(NP) swabs in vial transport medium  Result Value Ref Range Status   SARS Coronavirus 2 by RT PCR NEGATIVE NEGATIVE Final    Comment: (NOTE) SARS-CoV-2 target nucleic acids are NOT DETECTED.  The SARS-CoV-2 RNA is generally detectable in upper respiratory specimens during the acute phase of infection. The lowest concentration of SARS-CoV-2 viral copies this assay can detect is 138 copies/mL. A negative result does not preclude SARS-Cov-2 infection and should not be used as the sole basis for treatment or other patient management decisions. A negative result may occur with  improper specimen collection/handling, submission of specimen other than nasopharyngeal swab, presence of viral mutation(s) within the areas targeted by this assay, and inadequate number of viral copies(<138 copies/mL). A negative result must be combined with clinical observations, patient history, and epidemiological information. The expected result is Negative.  Fact Sheet for Patients:  EntrepreneurPulse.com.au  Fact Sheet for Healthcare Providers:  IncredibleEmployment.be  This test is no t yet approved or cleared by the [place] FDA and  has been authorized for detection and/or diagnosis of SARS-CoV-2 by FDA under an Emergency Use Authorization (EUA). This EUA will remain  in effect (meaning this test can be used) for the duration of the COVID-19 declaration under Section 564(b)(1) of the Act, 21 U.S.C.section 360bbb-3(b)(1), unless the authorization is terminated  or revoked sooner.       Influenza A by PCR NEGATIVE NEGATIVE Final   Influenza B by PCR NEGATIVE NEGATIVE Final    Comment: (NOTE) The Xpert Xpress SARS-CoV-2/FLU/RSV plus assay is intended as an aid in the diagnosis of  influenza from Nasopharyngeal swab specimens and should not be used as a sole basis for treatment. Nasal washings and aspirates are unacceptable for Xpert Xpress SARS-CoV-2/FLU/RSV testing.  Fact Sheet for Patients: EntrepreneurPulse.com.au  Fact Sheet for Healthcare Providers: IncredibleEmployment.be  This test is not yet approved or cleared by the [place] FDA and has been authorized for detection and/or diagnosis of SARS-CoV-2 by FDA under an Emergency Use Authorization (EUA). This EUA will remain in effect (meaning this test can be used) for the duration of the COVID-19 declaration under Section 564(b)(1) of the  Act, 21 U.S.C. section 360bbb-3(b)(1), unless the authorization is terminated or revoked.  Performed at [facility], [address] [place], [place] [address]   MRSA Next Gen by PCR, Nasal     Status: None   Collection Time: 11/21/20  9:14 PM   Specimen: Nasal Mucosa; Nasal Swab  Result Value Ref Range Status   MRSA by PCR Next Gen NOT DETECTED NOT DETECTED Final    Comment: (NOTE) The GeneXpert MRSA Assay (FDA approved for NASAL specimens only), is one component of a comprehensive MRSA colonization surveillance program. It is not intended to diagnose MRSA infection nor to guide or monitor treatment for MRSA infections. Test performance is not FDA approved in patients less than 67 years old. Performed at [facility], [address] [place], [place] [address]     IMAGING RESULTS:  I have personally reviewed the films Loop of distended colon in the left upper quadrant measuring 6.0 cm in caliber, consistent with ileus or obstruction. ?    Increased left basilar atelectasis or infiltrate is noted  Impression/Recommendation Altered sensorium- Excess alcohol use Delirium tremens Hypoxia Intubated and sedated? Falls Fracture L1 Fracture ribs- rt side Fever since hospitalization Left lower  lobe infiltrate developed in the hospital R/o aspiration pneumonia Colon Ileus VS obstruction Constipation for >Week Pt currently on ceftriaxone and azithromycin Persistent fever- Dc ceftriaxone and change to meropenem to cover aspiration pneumonia, intraabdominal pathogens  This Will also treat CNS infections  Need to consider herpes encephalitis  Recommend MRI with contrast to look for temporal  lobe enhancement. EEG did not show PLEDS - Recommend LP  Spoke to Dr.Mcquaid-He will try to do LP- if not then IR can try tomorrow, please snd for cell count, chem, culture, HSV PCR- save some ( 3-5 cc)  for further tests if needed   ? ? ___________________________________________________ Discussed with partner and intensivist Note:  This document was prepared using Dragon voice recognition software and may include unintentional dictation errors.

## 2020-11-24 NOTE — Consult Note (Signed)
PHARMACY CONSULT NOTE  Pharmacy Consult for Electrolyte Monitoring and Replacement   Recent Labs: Potassium (mmol/L)  Date Value  11/24/2020 4.1   Magnesium (mg/dL)  Date Value  63/78/5885 2.3   Calcium (mg/dL)  Date Value  02/77/4128 8.2 (L)   Albumin (g/dL)  Date Value  78/67/6720 3.8   Phosphorus (mg/dL)  Date Value  94/70/9628 4.4   Sodium (mmol/L)  Date Value  11/24/2020 140   Assessment: Patient is a 55 y/o M with medical history including EtOH abuse who presented to the ED 9/19 with chief complaint of SOB, dark colored sputum, back pain and thumb pain. While in the ED, patient began experiencing hallucinations and encephalopathy. Patient ultimately required intubation 9/19. Patient is now admitted to the ICU where he remains intubated, sedated, and on mechanical ventilation. Pharmacy consulted to assist with electrolyte monitoring and replacement as indicated.  Nutrition: Tube feeds started 9/20  Goal of Therapy:  Electrolytes within normal limits  Plan:  --No electrolyte replacement warranted at this time --Continue to follow  Pricilla Riffle, PharmD, BCPS Clinical Pharmacist 11/24/2020 12:10 PM

## 2020-11-24 NOTE — Procedures (Signed)
[facility] LP Procedure   Indication: encephalopathy, fever, concern for CNS infection The patient's significant other was explained the risks and benefits and gave consent for the procedure.  Time out was called  The skin was prepped with chlorhexadine and sterile precautions were used.  1% lidocaine was used to anesthetize the skin over the L4-L5 level, one space lower than the previous attempt, at the level of the iliac crests.  The needle was inserted into the palpable space between the spinous processes twice, but no CSF could be obtained.  The procedure was aborted.  Will need fluoro guided LP in AM.    Heber Glorieta, MD [facility] Pager: [phone] Cell: [phone] After 7:00 pm call Elink  [phone]

## 2020-11-24 NOTE — Consult Note (Signed)
  Have attempted to make contact with this patient for contact today and yesterday. Patient is unstable, intubated in the ICU. No family present at the time of visits. Will continue to follow

## 2020-11-24 NOTE — Progress Notes (Signed)
NAME:  Seth Harper, MRN:  [id number], DOB:  1966-02-28, LOS: 3 ADMISSION DATE:  11/21/2020, CONSULTATION DATE:  11/21/20 REFERRING MD:  Conni Slipper, MD, CHIEF COMPLAINT:  Altered Mentation   History of Present Illness:  Seth Harper is a 55 year old male with history of alcohol abuse/dependence who presented to the ER on 9/19 with progressive shortness of breath, dark colored sputum, back pain and thumb pain. He started to have hallucinations with increasing altered mentation while in the ER. Further history obtained by the ER nursing staff from the patient's significant other who reports the patient fell last Friday and lost consciousness. She says he has not slept in days, usually drinks 1.5 gallons of rum per week and his last drink was on Friday prior to the fall. Due to his increasing agitation and altered mentation he was sedated and intubated in the ER. PCCM has been called to admit the patient.   Pertinent  Medical History  EtOH abuse/dependence  Significant Hospital Events: Including procedures, antibiotic start and stop dates in addition to other pertinent events   9/19 Intubated, admitted to the ICU.  LP unsuccessful in the ED.  CT MRI head and EEG negative 9/20 changed to PCV due to vent dyssynchrony, lactic acid improved 9/22: Persistent fevers, recollect Tracheal aspirate, Doppler US BLE, consult ID.  KUB concerning for ileus vs SBO  Interim History / Subjective:  -No acute events noted overnight -Persistent fever (Temp 101) -WBC improved to 9.1 (10.3 yesterday) -Hemodynamically stable, no Vasopressors -Will recollect Tracheal aspirate, obtain LE dopplers, and consult ID for persistent fever -KUB concerning for Ileus vs. SBO ~ NGT placed to LIS, start Reglan, check lactic  Objective   Blood pressure (!) 146/84, pulse (!) 111, temperature (!) 101.1 F (38.4 C), resp. rate (!) 22, height _0  (1.905 m), weight 111.6 kg, SpO2 93 %.    Vent Mode: PCV FiO2 (%):  [40 %] 40  % Set Rate:  [20 bmp-22 bmp] 20 bmp PEEP:  [5 cmH20] 5 cmH20 Plateau Pressure:  [17 cmH20] 17 cmH20   Intake/Output Summary (Last 24 hours) at 11/24/2020 1200 Last data filed at 11/24/2020 0900 Gross per 24 hour  Intake 2943.18 ml  Output 2000 ml  Net 943.18 ml    Filed Weights   11/21/20 1001  Weight: 111.6 kg    Examination: Blood pressure (!) 151/80, pulse (!) 112, temperature (!) 100.8 F (38.2 C), resp. rate (!) 26, height _1  (1.905 m), weight 111.6 kg, SpO2 94 %. Gen:      Acutely ill appearing male, laying in bed, intubated and sedated, No acute distress HEENT:  EOMI, sclera anicteric, atraumatic, normocephalic Neck:     No masses; no thyromegaly, ETT Lungs:    Mechanical breath sounds bilaterally; no wheezing, synchronous with the vent, event CV:         Tachycardia, Regular rhythm; no murmurs, rubs, or gallops Abd:      + bowel sounds; soft, non-tender; no palpable masses, no distension Ext:    No edema; adequate peripheral perfusion Skin:      Warm and dry; no rash Neuro:  Sedated, withdraws from pain, Pupils PERRLA    Resolved Hospital Problem list   Lactic Acidosis - resolved Anion Gap Metabolic Acidosis - resolved  Assessment & Plan:   Altered Mental Status Alcohol Withdrawal/Dependence Sedation needs in the setting of mechanical ventilation -Maintain a RASS goal of -1 to -2 -Propofol, Fentanyl, and Versed as needed to maintain RASS goal -  Avoid sedating medications as able -Daily wake up assessment -Continue CIWA protocol -Continue thiamine, folate -MRI Brain negative on 11/22/20  Acute Hypoxemic Respiratory Failure due to Community Acquired Pneumonia Emphysema -Full vent support, implement lung protective strategies -Plateau pressures less than 30 cm H20 -Wean FiO2 & PEEP as tolerated to maintain O2 sats >88% -Follow intermittent Chest X-ray & ABG as needed -Spontaneous Breathing Trials when respiratory parameters met and mental status  permits -Implement VAP Bundle -Bronchodilators -Continue CAP coverage  Sepsis present on admission secondary to community-acquired pneumonia Persistent Fevers Patient with fever, tachycardia, tachypnea, altered mentation and elevated WBC count with unkown source at this time. LP unsuccessful in the ER. Possibly related to withdrawal vs infection  -Monitor fever curve -Trend WBC's & Procalcitonin -Follow cultures as above -Continue empiric Azithromycin & Ceftriaxone pending cultures & sensitivities -Recollect Tracheal aspirate 9/22 -Venous US of BLE negative for DVT on 9/22 -Consult Infectious Diseases, appreciate input  Concern for Ileus vs. Small Bowel Obstruction -NPO, hold tube feeds for now -OGT to LIS -Start Reglan -Follow up KUB in am -Check lactic acid -If lactic acid is elevated, will obtain CT Abdomen & Pelvis w/ consideration for General Surgery consult  L1 Compression Fracture s/p fall at home Conservative management, pain control  Malnutrition due to chronic alcohol use Tube feeds per nutrition ~ feeds on hold for now due to ileus vs SBO  Best Practice (right click and "Reselect all SmartList Selections" daily)   Diet/type: NPO DVT prophylaxis: prophylactic heparin  GI prophylaxis: PPI Lines: N/A Foley:  yes, and is still needed Code Status:  full code Last date of multidisciplinary goals of care discussion [11/24/2020]  Updated pt's significant other Ebony Hail and pt's sister at bedside 11/24/20  Critical care time: 40 minutes   The patient is critically ill with multiple organ system failure and requires high complexity decision making for assessment and support, frequent evaluation and titration of therapies, advanced monitoring, review of radiographic studies and interpretation of complex data.   Darel Hong, AGACNP-BC [facility] Prefer epic messenger for cross cover needs If after hours, please call E-link

## 2020-11-24 NOTE — Progress Notes (Signed)
[facility] PCCM  Discussed with Dr. Rivka Safer. Given persistent fevers and encephalopathy they feel that a lumbar puncture may be indicated.  Will perform LP then administer acyclovir and fluids afterwards  Have asked ICU team to get consent for the procedure.  Heber Sobieski, MD  PCCM Pager: [phone] Cell: [phone] After 7:00 pm call Elink  [phone]

## 2020-11-25 ENCOUNTER — Inpatient Hospital Stay: Payer: Medicaid Other

## 2020-11-25 DIAGNOSIS — G9341 Metabolic encephalopathy: Secondary | ICD-10-CM

## 2020-11-25 LAB — CBC WITH DIFFERENTIAL/PLATELET
Abs Immature Granulocytes: 0.33 10*3/uL — ABNORMAL HIGH (ref 0.00–0.07)
Basophils Absolute: 0.1 10*3/uL (ref 0.0–0.1)
Basophils Relative: 0 %
Eosinophils Absolute: 0 10*3/uL (ref 0.0–0.5)
Eosinophils Relative: 0 %
HCT: 42.9 % (ref 39.0–52.0)
Hemoglobin: 14.5 g/dL (ref 13.0–17.0)
Immature Granulocytes: 3 %
Lymphocytes Relative: 10 %
Lymphs Abs: 1.3 10*3/uL (ref 0.7–4.0)
MCH: 36.4 pg — ABNORMAL HIGH (ref 26.0–34.0)
MCHC: 33.8 g/dL (ref 30.0–36.0)
MCV: 107.8 fL — ABNORMAL HIGH (ref 80.0–100.0)
Monocytes Absolute: 1.4 10*3/uL — ABNORMAL HIGH (ref 0.1–1.0)
Monocytes Relative: 11 %
Neutro Abs: 9.7 10*3/uL — ABNORMAL HIGH (ref 1.7–7.7)
Neutrophils Relative %: 76 %
Platelets: 178 10*3/uL (ref 150–400)
RBC: 3.98 MIL/uL — ABNORMAL LOW (ref 4.22–5.81)
RDW: 13.6 % (ref 11.5–15.5)
WBC: 12.9 10*3/uL — ABNORMAL HIGH (ref 4.0–10.5)
nRBC: 0 % (ref 0.0–0.2)

## 2020-11-25 LAB — BASIC METABOLIC PANEL
Anion gap: 11 (ref 5–15)
BUN: 22 mg/dL — ABNORMAL HIGH (ref 6–20)
CO2: 29 mmol/L (ref 22–32)
Calcium: 8 mg/dL — ABNORMAL LOW (ref 8.9–10.3)
Chloride: 103 mmol/L (ref 98–111)
Creatinine, Ser: 0.58 mg/dL — ABNORMAL LOW (ref 0.61–1.24)
GFR, Estimated: >60 mL/min (ref >60)
Glucose, Bld: 113 mg/dL — ABNORMAL HIGH (ref 70–99)
Potassium: 4.1 mmol/L (ref 3.5–5.1)
Sodium: 143 mmol/L (ref 135–145)

## 2020-11-25 LAB — CSF CELL COUNT WITH DIFFERENTIAL
Eosinophils, CSF: 0 %
Eosinophils, CSF: 0 %
Lymphs, CSF: 39 %
Lymphs, CSF: 72 %
Monocyte-Macrophage-Spinal Fluid: 4 %
Monocyte-Macrophage-Spinal Fluid: 4 %
RBC Count, CSF: 0 /mm3 (ref 0–3)
RBC Count, CSF: 5 /mm3 — ABNORMAL HIGH (ref 0–3)
Segmented Neutrophils-CSF: 24 %
Segmented Neutrophils-CSF: 57 %
Tube #: 1
Tube #: 3
WBC, CSF: 18 /mm3 (ref 0–5)
WBC, CSF: 27 /mm3 (ref 0–5)

## 2020-11-25 LAB — PATHOLOGIST SMEAR REVIEW

## 2020-11-25 LAB — PROTEIN AND GLUCOSE, CSF
Glucose, CSF: 48 mg/dL (ref 40–70)
Total  Protein, CSF: 181 mg/dL — ABNORMAL HIGH (ref 15–45)

## 2020-11-25 LAB — GLUCOSE, CAPILLARY
Glucose-Capillary: 107 mg/dL — ABNORMAL HIGH (ref 70–99)
Glucose-Capillary: 109 mg/dL — ABNORMAL HIGH (ref 70–99)
Glucose-Capillary: 121 mg/dL — ABNORMAL HIGH (ref 70–99)
Glucose-Capillary: 94 mg/dL (ref 70–99)
Glucose-Capillary: 95 mg/dL (ref 70–99)
Glucose-Capillary: 96 mg/dL (ref 70–99)
Glucose-Capillary: 97 mg/dL (ref 70–99)

## 2020-11-25 LAB — LACTIC ACID, PLASMA: Lactic Acid, Venous: 2 mmol/L (ref 0.5–1.9)

## 2020-11-25 LAB — PROCALCITONIN: Procalcitonin: 2.63 ng/mL

## 2020-11-25 IMAGING — DX DG ABDOMEN 1V
2 series · 3 of 3 positions shown · non-contrast
Comparison: Abdominal radiograph dated [DATE].

CLINICAL DATA: Ileus.  Acute respiratory failure.

EXAM:
ABDOMEN - 1 VIEW

[Series 1: abdomen supine · 0.14mm/px · 2 of 2 slices shown (1 of 2)]
[im 1/2]
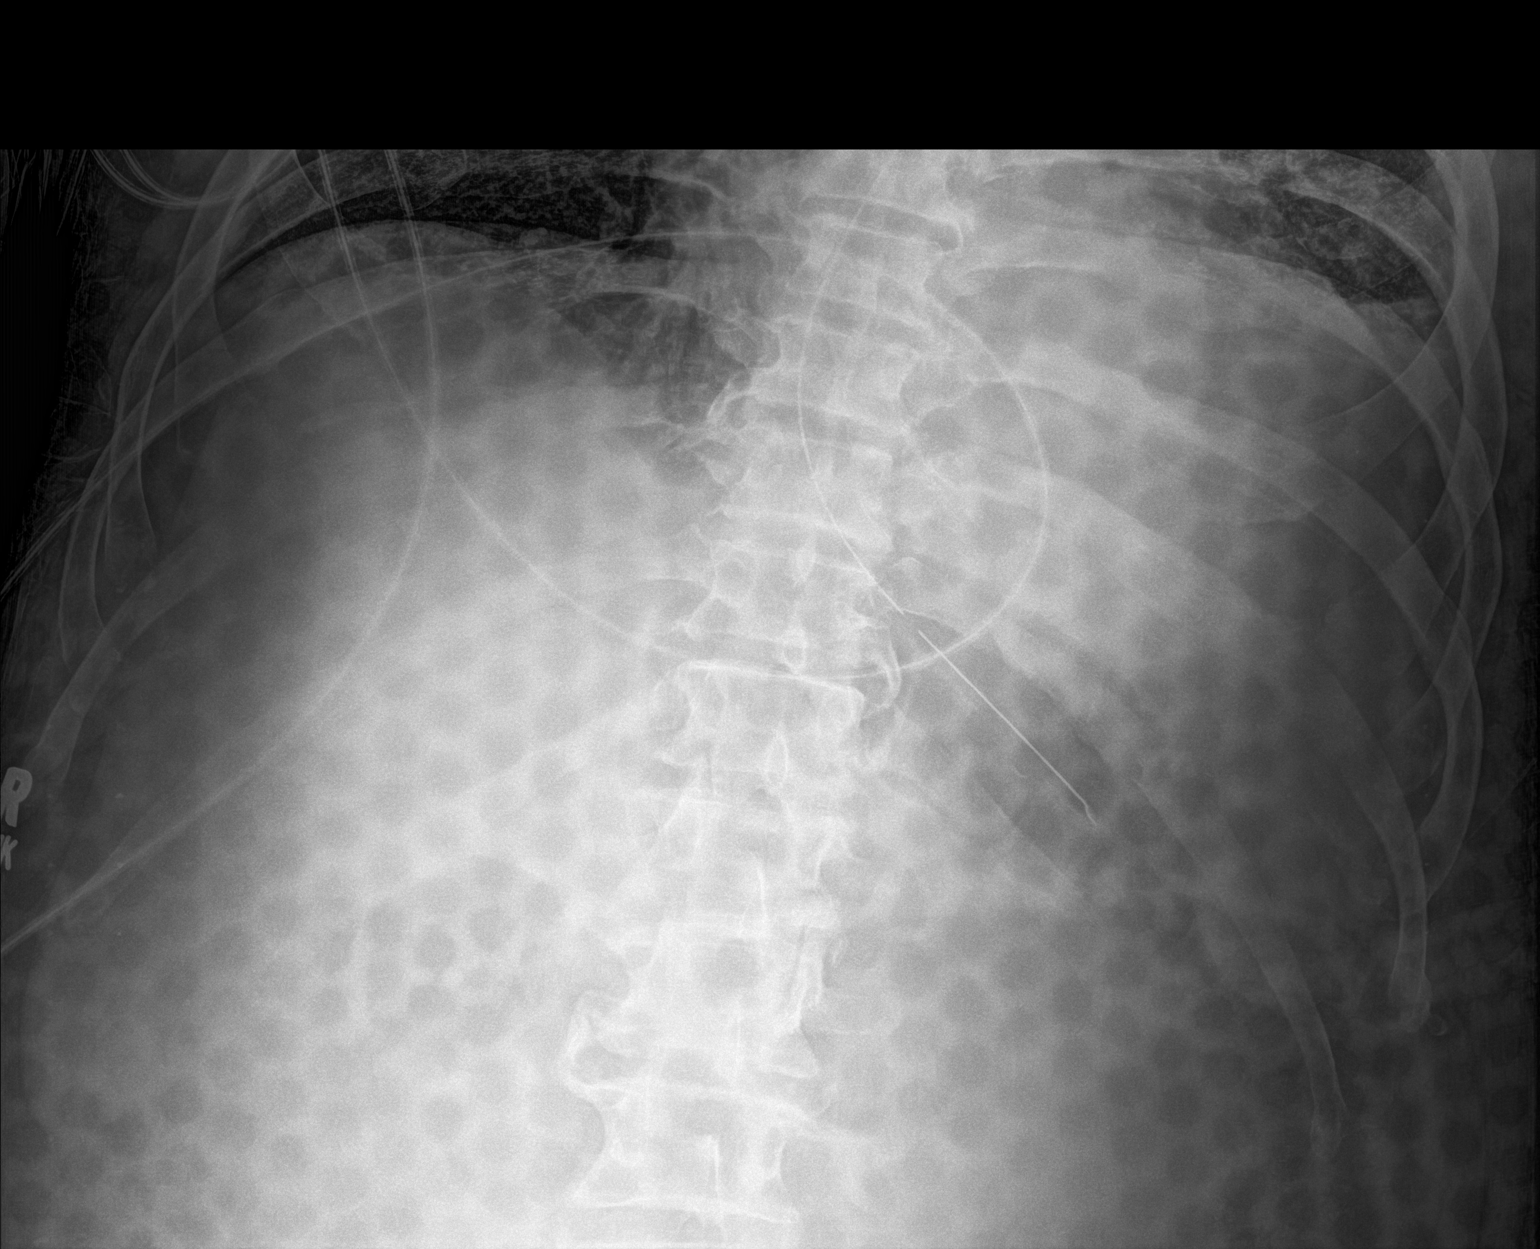
[im 2/2]
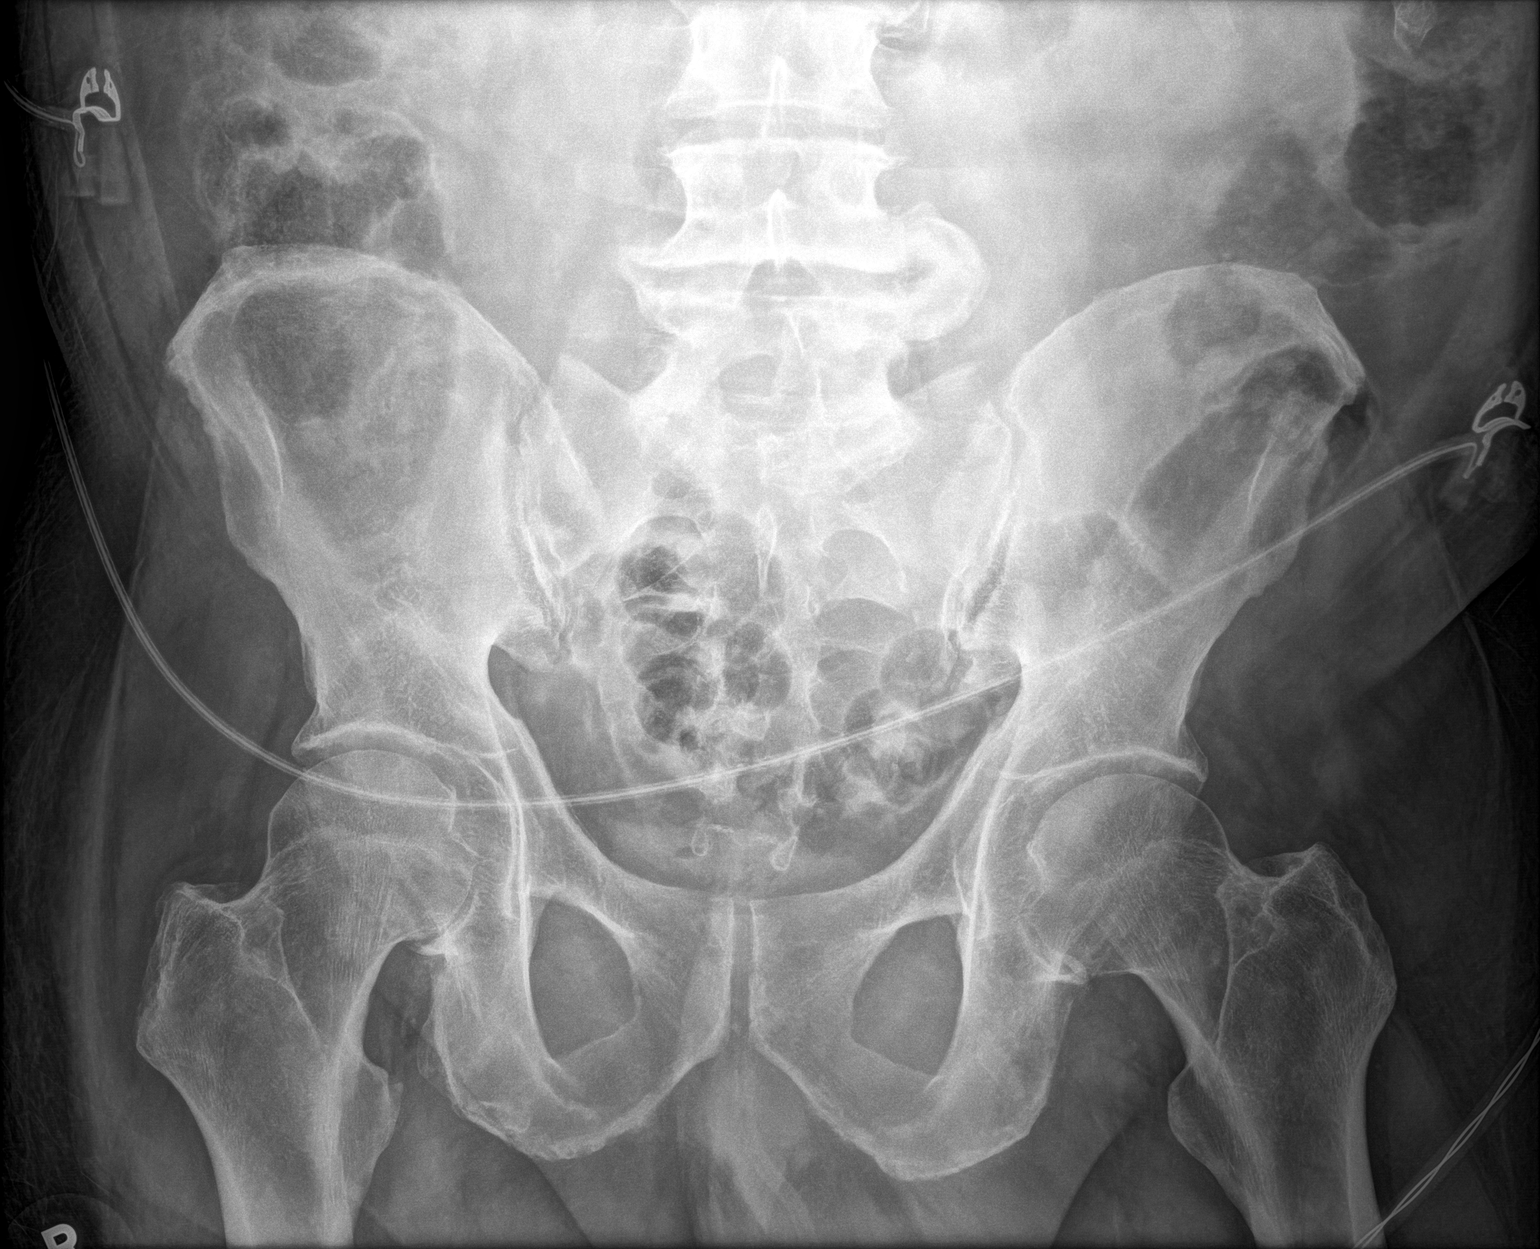

[abdomen supine (2 of 2)]
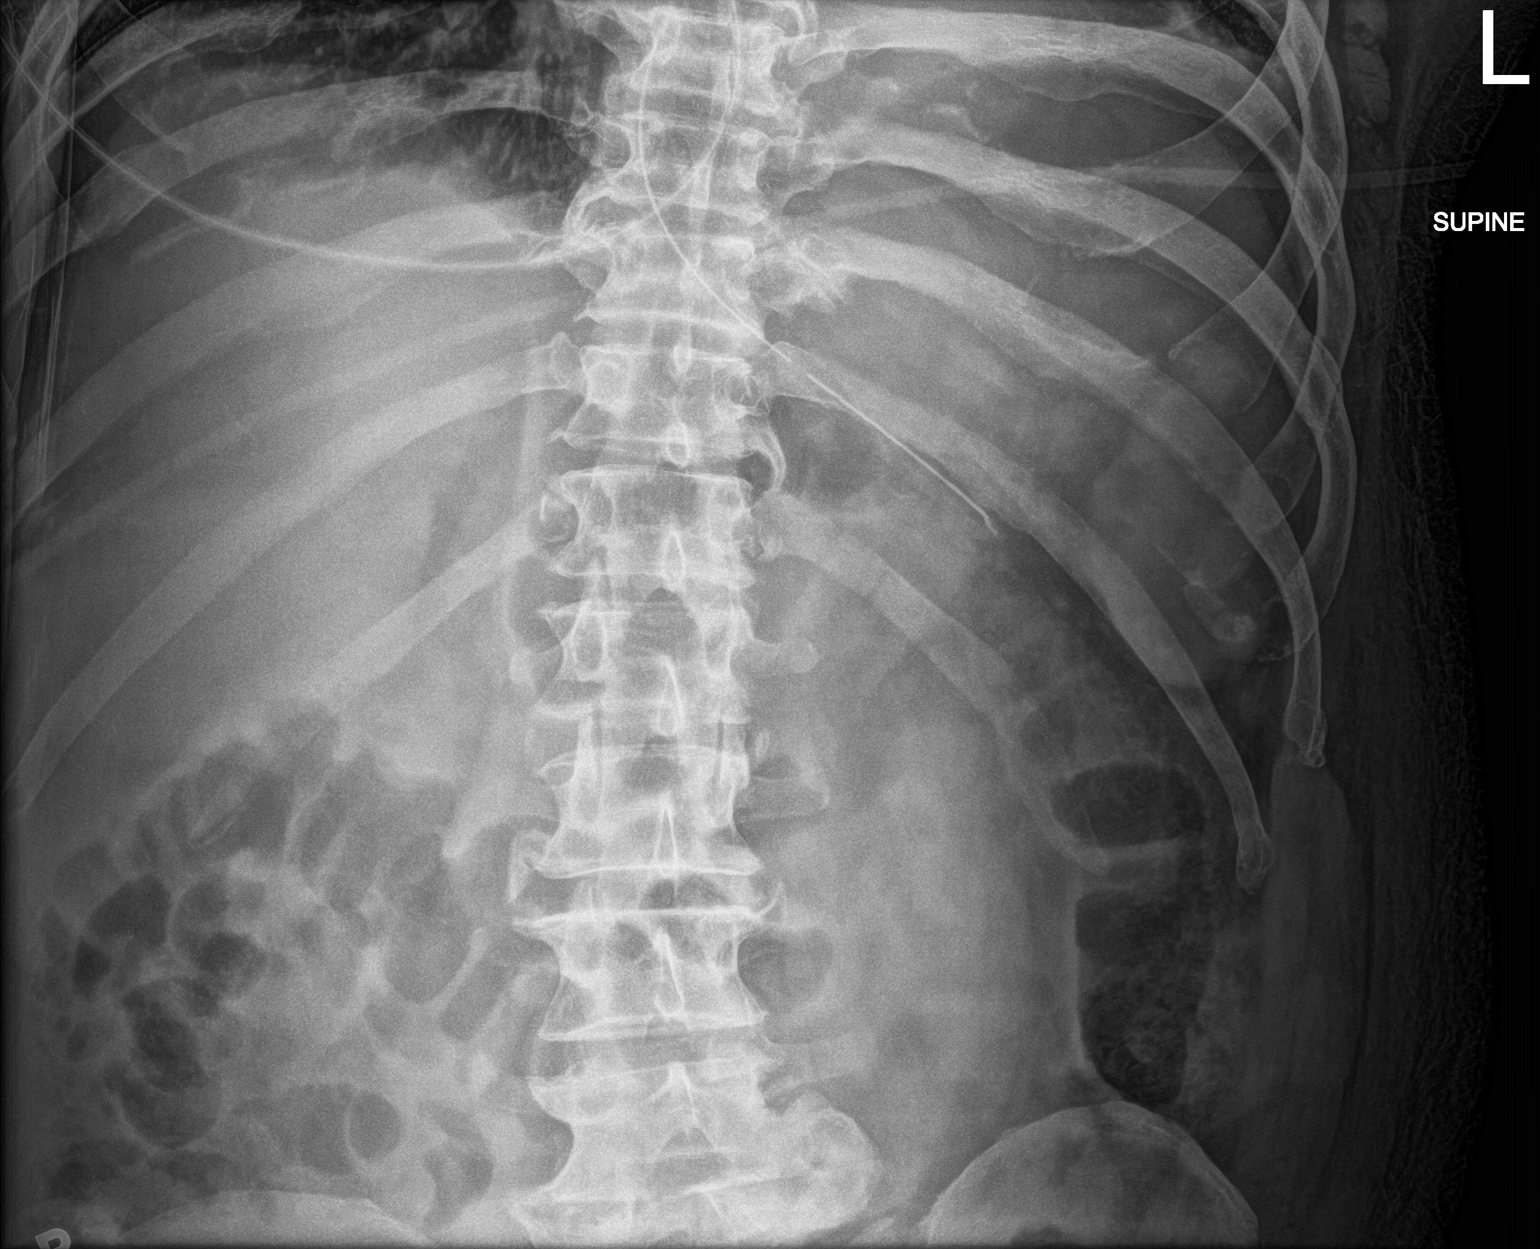

[3 of 3 positions shown; findings below may reference images not displayed]

FINDINGS: Enteric tube with tip and side-port in the proximal stomach. No
bowel dilatation or evidence of obstruction. Air is noted in the
colon. Degenerative changes of the spine. No acute osseous
pathology.
IMPRESSION: Enteric tube in the proximal stomach. No bowel obstruction.

## 2020-11-25 IMAGING — RF DG SPINAL PUNCT LUMBAR DIAG WITH FL CT GUIDANCE
2 series · 2 of 2 positions shown · non-contrast
Comparison: Previous MRI brain imaging [DATE] and lumbar spine
imaging [DATE] was reviewed prior to the procedure.

CLINICAL DATA: Altered mental status changes request received for
lumbar puncture.

EXAM:
DIAGNOSTIC LUMBAR PUNCTURE UNDER FLUOROSCOPIC GUIDANCE

[Series 1: fluoro_iodine bariatric 2fps_bw · 0.19mm/px · 1 of 1 slices shown]
[im 1/1]
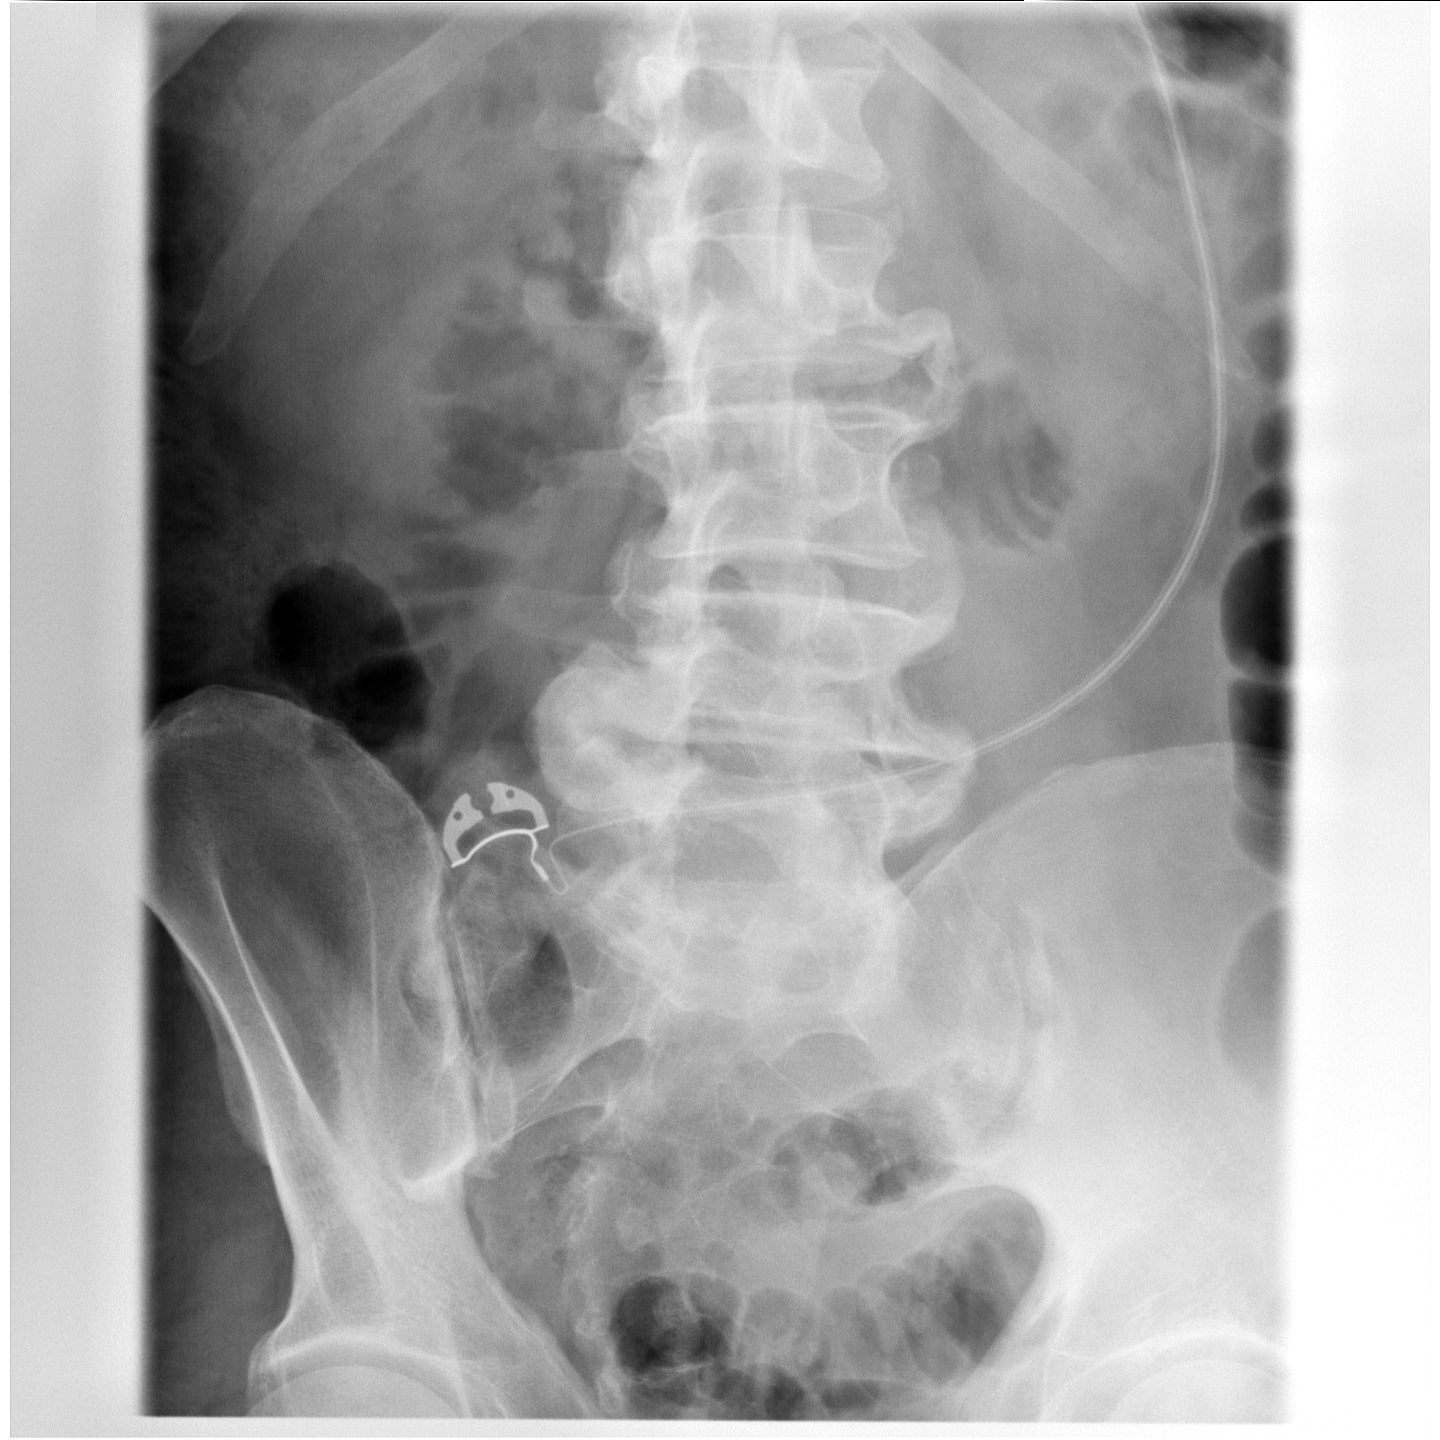

[Series 2: cp_bariatric · 0.19mm/px · 1 of 1 slices shown]
[im 1/1]
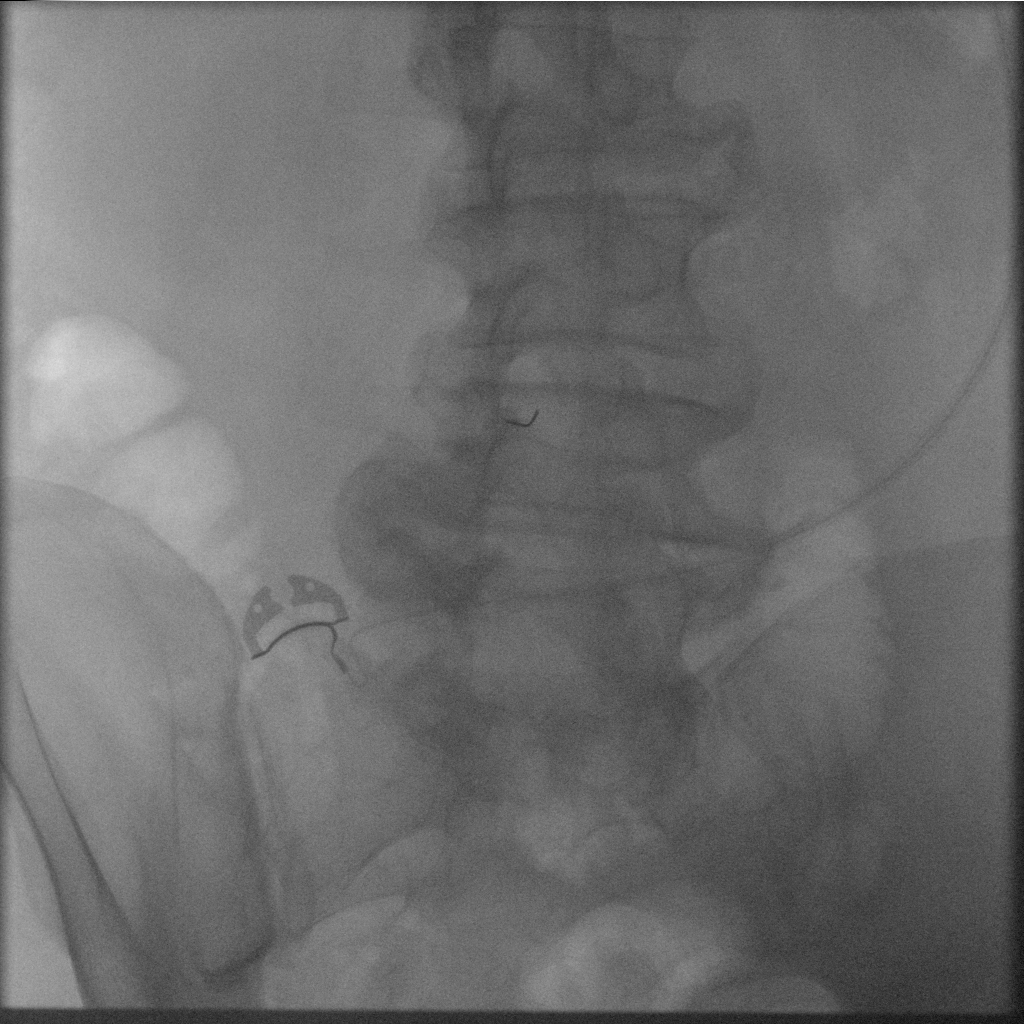

[2 of 2 positions shown; findings below may reference images not displayed]

FLUOROSCOPY TIME:  Fluoroscopy Time:  0.2 minute

Radiation Exposure Index (if provided by the fluoroscopic device):
6.7 mGy

Number of Acquired Spot Images: 2

PROCEDURE:
Informed consent was obtained from the patient's spouse prior to the
procedure, including potential complications of CSF leak with need
for additional procedures such as blood patch, nerve damage,
headache, infection, and bleeding. With the patient prone, the lower
back was prepped with Betadine. 1% Lidocaine was used for local
anesthesia. Lumbar puncture was performed at the L3-L4 level using a
22 gauge needle with return of pale yellow CSF with an opening
pressure of 26 cm water. 9 ml of CSF were obtained for laboratory
studies. The patient tolerated the procedure well and there were no
apparent complications.
IMPRESSION: Technically successful fluoroscopic guided lumbar puncture.

## 2020-11-25 IMAGING — MR MR HEAD W/ CM
6 series · 26 of 48 positions shown · IV contrast (gadavist)
Comparison: Noncontrast brain MRI [DATE].

CLINICAL DATA: Mental status change, unknown cause. Additional
history provided by scanning technologist: Concern for meningitis,
seizures.

EXAM:
MRI HEAD WITH CONTRAST
TECHNIQUE: Multiplanar, multiecho pulse sequences of the brain and surrounding
structures were obtained with intravenous contrast.
CONTRAST:  10mL GADAVIST GADOBUTROL 1 MMOL/ML IV SOLN

[Series 5: T1 · axial · 1.0mm · 0.98mm/px · z∈[-107,+61]mm · 8 of 176 slices shown]
[im 1/176]
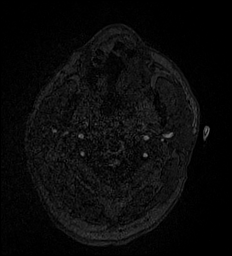
[im 26/176]
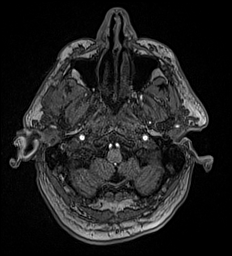
[im 51/176]
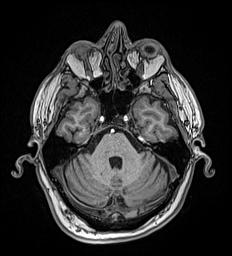
[im 76/176]
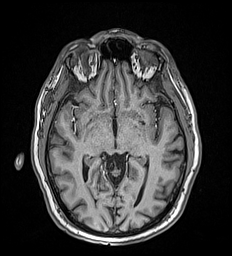
[im 101/176]
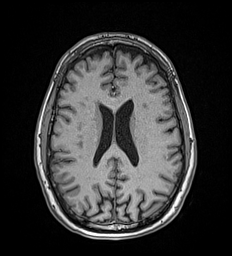
[im 126/176]
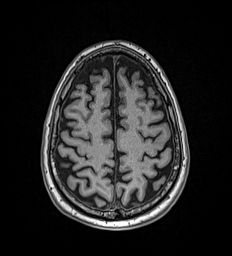
[im 151/176]
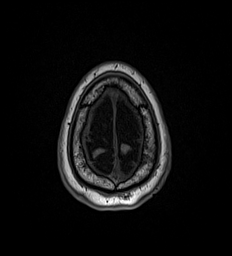
[im 176/176]
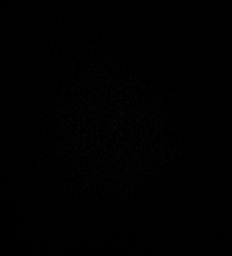

[Series 6: T2 · coronal · 3.0mm · 0.23mm/px · 3 of 35 slices shown]
[im 1/35]
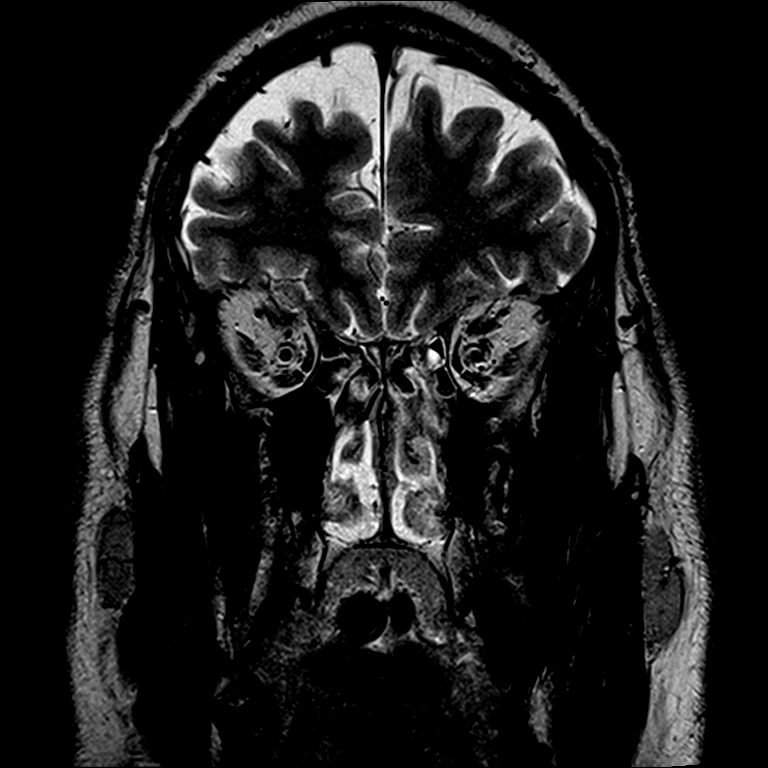
[im 18/35]
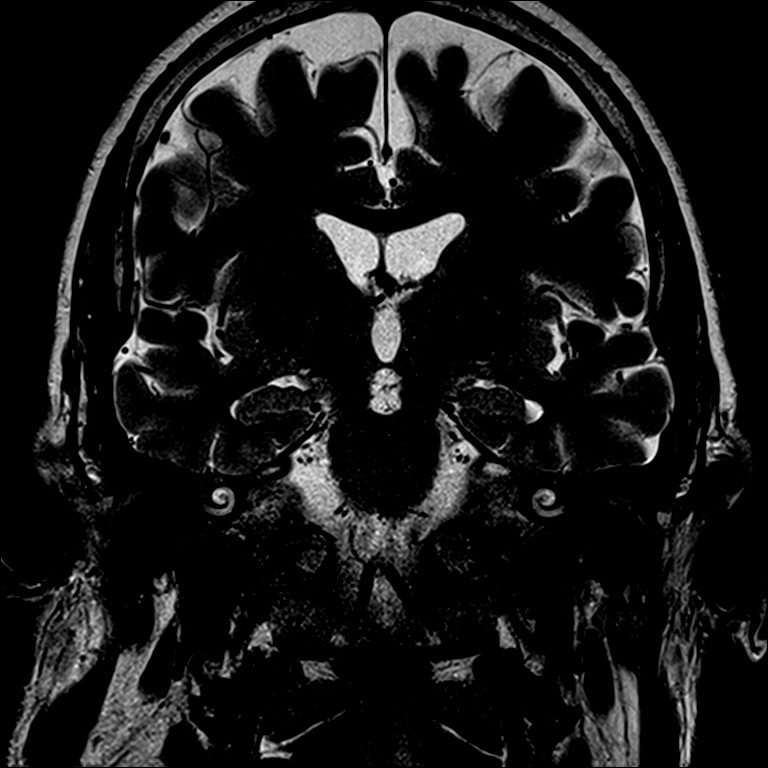
[im 35/35]
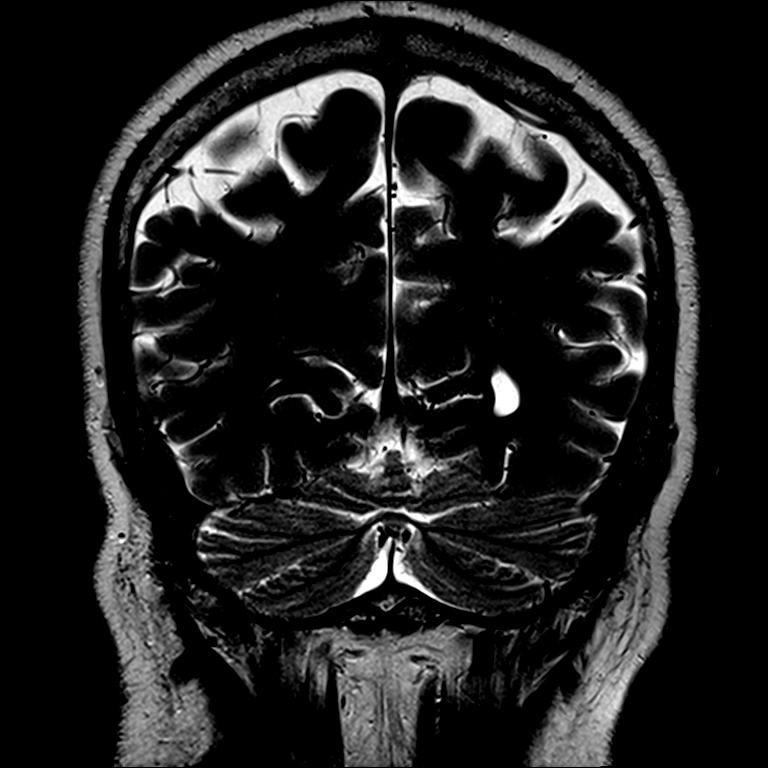

[Series 7: T1 post-contrast · axial · 1.0mm · 0.98mm/px · z∈[-107,+61]mm · 9 of 176 slices shown (1 of 3)]
[im 1/176]
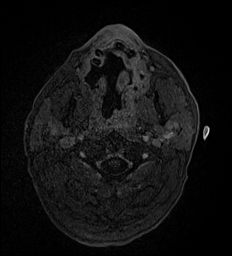
[im 26/176]
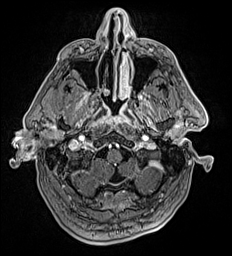
[im 51/176]
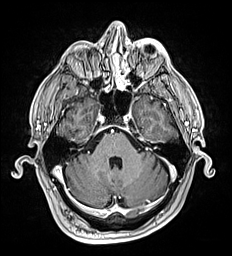
[im 76/176]
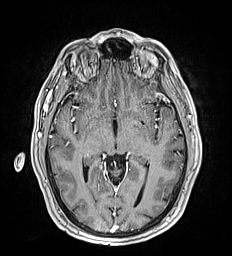
[im 88/176]
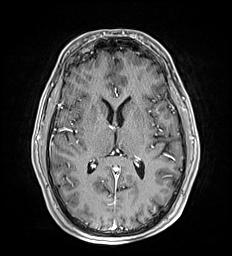
[im 101/176]
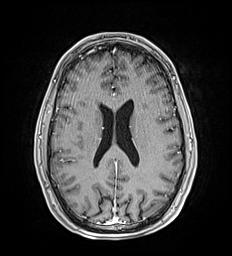
[im 126/176]
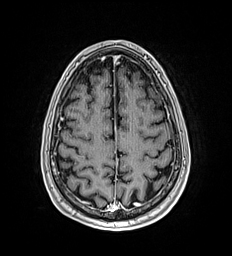
[im 151/176]
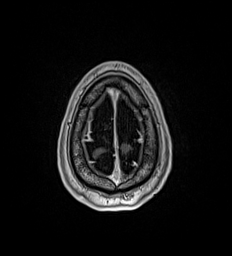
[im 176/176]
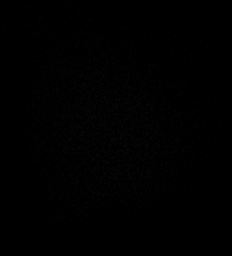

[Series 8: T1 post-contrast · coronal · 5.0mm · 0.69mm/px · 3 of 33 slices shown (2 of 3)]
[im 1/33]
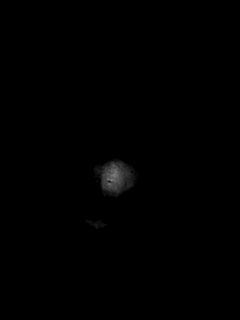
[im 17/33]
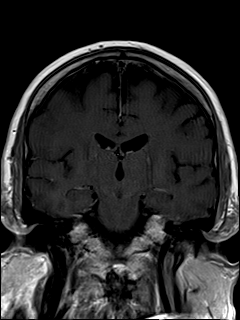
[im 33/33]
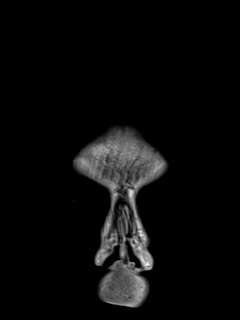

[Series 9: T1 post-contrast · sagittal · 5.0mm · 0.65mm/px · 2 of 25 slices shown (3 of 3)]
[im 1/25]
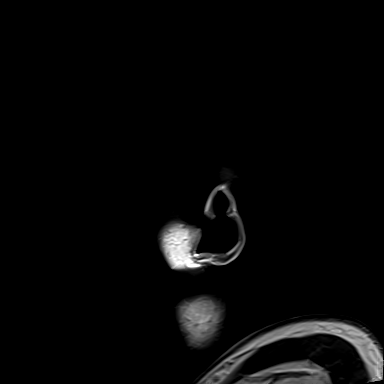
[im 25/25]
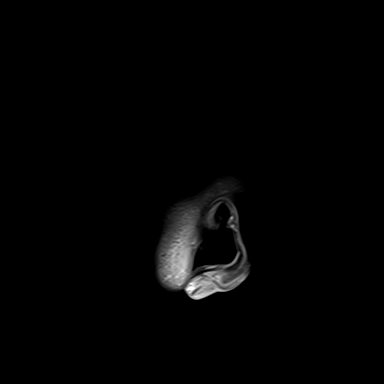

[Series 1027: cor p-a rl · coronal · 1.0mm · 0.22mm/px · 1 of 117 slices shown]
[im 1/117]
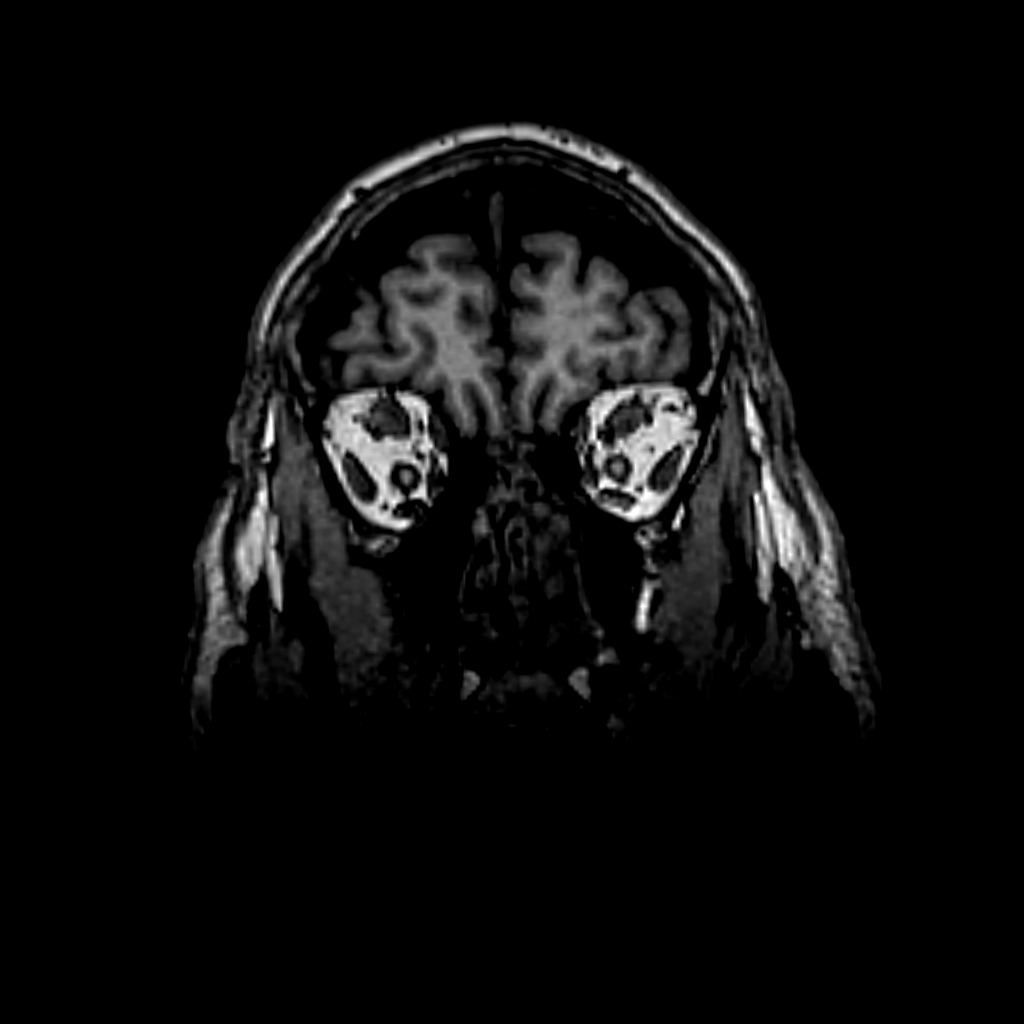

[26 of 48 positions shown; findings below may reference images not displayed]

FINDINGS: Brain:

A contrast-enhanced brain MRI was performed as a follow-up to the
recent prior noncontrast brain MRI of [DATE]. For today's
examination the following sequences were acquired: Axial T1 weighted
precontrast sequence, axial T1 weighted postcontrast sequence,
coronal T1 weighted postcontrast sequence, sagittal T1 weighted
postcontrast sequence, coronal T2 TSE postcontrast sequence.

Mild generalized cerebral and cerebellar atrophy.

No pathologic intracranial enhancement is identified.

No intracranial mass or extra-axial fluid collection is identified
on the acquired sequences. No midline shift.

Vascular: Enhancement within the proximal large arterial vessels.

Skull and upper cervical spine: No focal suspicious marrow lesion.

Sinuses/Orbits: Visualized orbits show no acute finding. Mild
paranasal sinus mucosal thickening, most notably within the left
ethmoid air cells.

Other: Bilateral mastoid effusions.
IMPRESSION: No pathologic intracranial enhancement is identified.

Paranasal sinus mucosal thickening, most notably left ethmoidal.

Bilateral mastoid effusions.

## 2020-11-25 IMAGING — DX DG CHEST 1V PORT
1 series · 1 of 1 positions shown · non-contrast
Comparison: Chest radiograph dated [DATE].

CLINICAL DATA: Respiratory failure.

EXAM:
PORTABLE CHEST 1 VIEW

[chest ap]
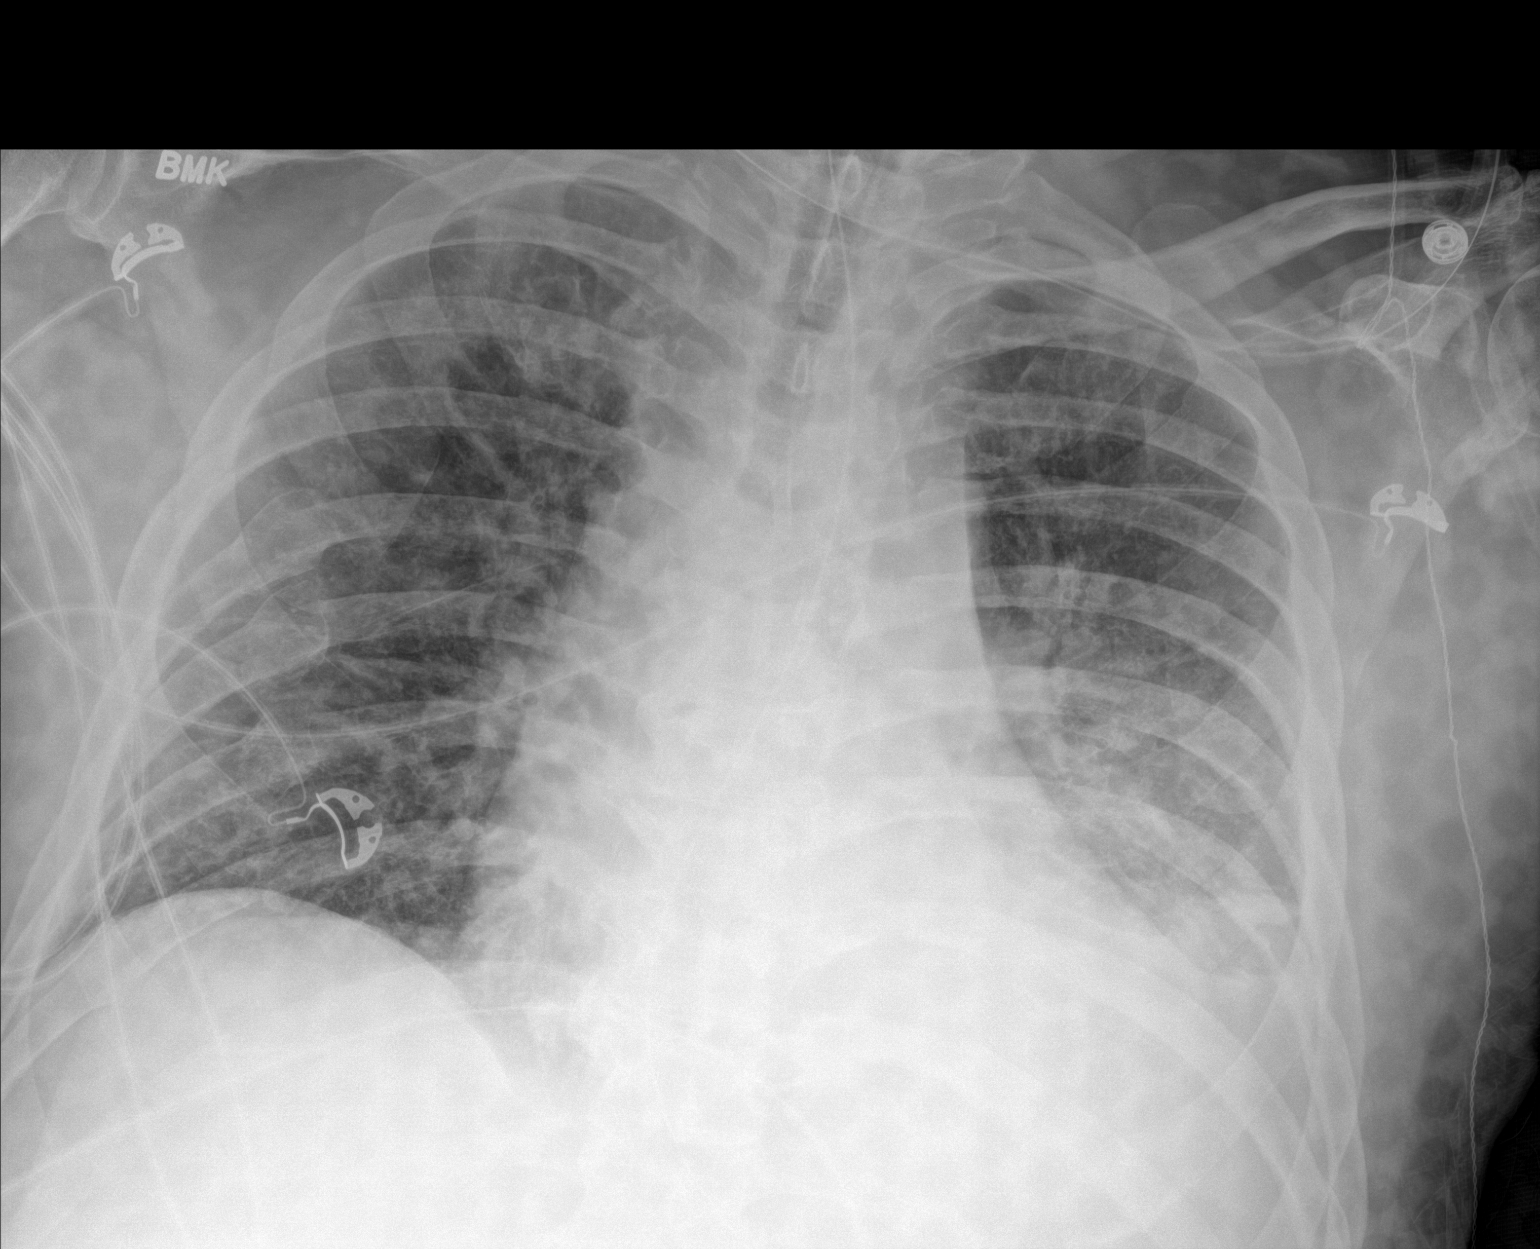

[1 of 1 positions shown; findings below may reference images not displayed]

FINDINGS: Endotracheal tube approximately 7 cm above the carina. Enteric tube
extends below the diaphragm. Small left pleural effusion and left
lung base atelectasis or infiltrate. There is cardiomegaly with
vascular congestion. No pneumothorax. No acute osseous pathology.
Old healed right posterior rib fractures.
IMPRESSION: 1. Cardiomegaly with vascular congestion.
2. Small left pleural effusion and left lung base atelectasis or
infiltrate.

## 2020-11-25 MED ORDER — STERILE WATER FOR INJECTION IJ SOLN
INTRAMUSCULAR | Status: AC
  Administered 2020-11-25: 10 mL
  Filled 2020-11-25: qty 10

## 2020-11-25 MED ORDER — VECURONIUM BROMIDE 10 MG IV SOLR
10.0000 mg | Freq: Once | INTRAVENOUS | Status: AC
  Administered 2020-11-25: 10 mg via INTRAVENOUS
  Filled 2020-11-25: qty 10

## 2020-11-25 MED ORDER — GADOBUTROL 1 MMOL/ML IV SOLN
10.0000 mL | Freq: Once | INTRAVENOUS | Status: AC | PRN
  Administered 2020-11-25: 10 mL via INTRAVENOUS

## 2020-11-25 MED ORDER — LIDOCAINE HCL (PF) 1 % IJ SOLN
5.0000 mL | Freq: Once | INTRAMUSCULAR | Status: AC
  Administered 2020-11-25: 5 mL

## 2020-11-25 MED ORDER — MIDAZOLAM BOLUS VIA INFUSION
2.0000 mg | INTRAVENOUS | Status: DC | PRN
  Filled 2020-11-25: qty 2

## 2020-11-25 NOTE — Progress Notes (Signed)
Eeg done 

## 2020-11-25 NOTE — Consult Note (Signed)
PHARMACY CONSULT NOTE  Pharmacy Consult for Electrolyte Monitoring and Replacement   Recent Labs: Potassium (mmol/L)  Date Value  11/25/2020 4.1   Magnesium (mg/dL)  Date Value  16/12/9602 2.3   Calcium (mg/dL)  Date Value  54/11/8117 8.0 (L)   Albumin (g/dL)  Date Value  14/78/2956 3.8   Phosphorus (mg/dL)  Date Value  21/30/8657 4.4   Sodium (mmol/L)  Date Value  11/25/2020 143   Assessment: Patient is a 55 y/o M with medical history including EtOH abuse who presented to the ED 9/19 with chief complaint of SOB, dark colored sputum, back pain and thumb pain. While in the ED, patient began experiencing hallucinations and encephalopathy. Patient ultimately required intubation 9/19. Patient is now admitted to the ICU where he remains intubated, sedated, and on mechanical ventilation. Pharmacy consulted to assist with electrolyte monitoring and replacement as indicated.  Nutrition: Tube feeds started 9/20  Goal of Therapy:  Electrolytes within normal limits  Plan:  --No electrolyte replacement warranted at this time --Continue to follow  Pricilla Riffle, PharmD, BCPS Clinical Pharmacist 11/25/2020 12:19 PM

## 2020-11-25 NOTE — Progress Notes (Addendum)
NAME:  Seth Harper, MRN:  [id number], DOB:  11/08/1965, LOS: 4 ADMISSION DATE:  11/21/2020, CONSULTATION DATE:  11/21/20 REFERRING MD:  Conni Slipper, MD, CHIEF COMPLAINT:  Altered Mentation   History of Present Illness:  Seth Harper is a 55 year old male with history of alcohol abuse/dependence who presented to the ER on 9/19 with progressive shortness of breath, dark colored sputum, back pain and thumb pain. He started to have hallucinations with increasing altered mentation while in the ER. Further history obtained by the ER nursing staff from the patient's significant other who reports the patient fell last Friday and lost consciousness. She says he has not slept in days, usually drinks 1.5 gallons of rum per week and his last drink was on Friday prior to the fall. Due to his increasing agitation and altered mentation he was sedated and intubated in the ER. PCCM has been called to admit the patient.   Pertinent  Medical History  EtOH abuse/dependence  Significant Hospital Events: Including procedures, antibiotic start and stop dates in addition to other pertinent events   9/19 Intubated, admitted to the ICU.  LP unsuccessful in the ED.  CT MRI head and EEG negative 9/20 changed to PCV due to vent dyssynchrony, lactic acid improved 9/22: Persistent fevers, recollect Tracheal aspirate, Doppler US BLE, consult ID.  KUB concerning for ileus vs SBO 9/23: Ileus resolved, to go for Fluro guided LP and MRI Brain w/ contrast, ABX changed to Meropenem, Acyclovir added  Interim History / Subjective:  -Attempt made at LP last night, unsuccessful -IR to perform LP today -To go for MRI brain w contrast ~ ABX changed to Meropenem and Acyclovir added -Ileus resolved -Fevers improved somewhat overnight (T max 100.8) -questionable seizure activity overnight, EEG pending  Objective   Blood pressure (!) 148/75, pulse (!) 108, temperature 99.5 F (37.5 C), resp. rate 20, height $RemoveBe'6\' 3"'OlARPVnLl$  (1.905 m), weight  130.9 kg, SpO2 95 %.    Vent Mode: PCV FiO2 (%):  [40 %] 40 % Set Rate:  [20 bmp] 20 bmp PEEP:  [5 cmH20] 5 cmH20 Plateau Pressure:  [19 cmH20-20 cmH20] 19 cmH20   Intake/Output Summary (Last 24 hours) at 11/25/2020 1202 Last data filed at 11/25/2020 1100 Gross per 24 hour  Intake 2654.69 ml  Output 2850 ml  Net -195.31 ml    Filed Weights   11/21/20 1001 11/25/20 0500  Weight: 111.6 kg 130.9 kg    Examination: Blood pressure (!) 151/80, pulse (!) 112, temperature (!) 100.8 F (38.2 C), resp. rate (!) 26, height $RemoveBe'6\' 3"'iZVebMgXr$  (1.905 m), weight 111.6 kg, SpO2 94 %. Gen:      Acutely ill appearing male, laying in bed, intubated and sedated, No acute distress HEENT:  EOMI, sclera anicteric, atraumatic, normocephalic Neck:     No masses; no thyromegaly, ETT Lungs:    Mechanical breath sounds bilaterally; no wheezing, synchronous with the vent, event CV:         Tachycardia, Regular rhythm; no murmurs, rubs, or gallops Abd:      + bowel sounds; soft, non-tender; no palpable masses, no distension Ext:    No edema; adequate peripheral perfusion Skin:      Warm and dry; no rash Neuro:  Sedated, withdraws from pain, [facility] Problem list     Assessment & Plan:   Altered Mental Status Alcohol Withdrawal/Dependence ? Seizure activity on 11/24/20 Sedation needs in the setting of mechanical ventilation -Maintain a RASS goal of -  1 to -2 -Propofol, Fentanyl, and Versed as needed to maintain RASS goal -Avoid sedating medications as able -Daily wake up assessment -Continue CIWA protocol -Continue thiamine, folate -MRI Brain negative on 11/22/20 -MRI Brain w contrast and LP by IR pending today -EEG pending  Acute Hypoxemic Respiratory Failure due to Community Acquired Pneumonia Emphysema -Full vent support, implement lung protective strategies -Plateau pressures less than 30 cm H20 -Wean FiO2 & PEEP as tolerated to maintain O2 sats >88% -Follow intermittent  Chest X-ray & ABG as needed -Spontaneous Breathing Trials when respiratory parameters met and mental status permits -Implement VAP Bundle -Bronchodilators -ABX changed to Meropenem per ID, Azithromycin continued  Sepsis present on admission secondary to community-acquired pneumonia Persistent Fevers ? Meningitis  Patient with fever, tachycardia, tachypnea, altered mentation and elevated WBC count with unkown source at this time. LP unsuccessful in the ER. Possibly related to withdrawal vs infection  -Monitor fever curve -Trend WBC's & Procalcitonin -Follow cultures as above -Continue empiric Azithromycin, Meropenem & Acyclovir pending cultures & sensitivities -ID following, appreciate input ~ will defer antibiotics to ID recommendations -Repeat tracheal aspirate with few gram + rods &cocci, few gram - rods -Venous US of BLE negative for DVT on 9/22 -LP by IR and MRI w/ contrast pending today  Concern for Ileus vs. Small Bowel Obstruction>>resolved -NPO, advance tube feeds as tolerated after today's imaging/procedures -Will continue Reglan for now  L1 Compression Fracture s/p fall at home Conservative management, pain control  Malnutrition due to chronic alcohol use Tube feeds per nutrition   Best Practice (right click and "Reselect all SmartList Selections" daily)   Diet/type: NPO DVT prophylaxis: prophylactic heparin  GI prophylaxis: PPI Lines: N/A Foley:  yes, and is still needed Code Status:  full code Last date of multidisciplinary goals of care discussion [11/25/2020]    Critical care time: 38 minutes   The patient is critically ill with multiple organ system failure and requires high complexity decision making for assessment and support, frequent evaluation and titration of therapies, advanced monitoring, review of radiographic studies and interpretation of complex data.   Darel Hong, AGACNP-BC [facility] Prefer epic messenger for cross  cover needs If after hours, please call E-link

## 2020-11-25 NOTE — Procedures (Signed)
PROCEDURE SUMMARY:  Successful fluoroscopic guided lumbar puncture. No immediate complications.   Specimen was sent for labs.  EBL < 82mL  Cloretta Ned 11/25/2020 12:48 PM

## 2020-11-25 NOTE — Progress Notes (Signed)
Chaplain Maggie visited with pt's spouse and sister in the ICU waiting room while patient was being attended to by staff. Room was made for spiritual conversation and prayer. A desire for strength was shared so pt can meet the challenges he is facing and will continue to face in recovery. Family was open to support and appreciated the visitation. Continued support available per on call chaplain.

## 2020-11-25 NOTE — Progress Notes (Signed)
Date of Admission:  11/21/2020     ID: Seth Harper is a 55 y.o. male Active Problems:   Alcohol withdrawal (HCC)    Subjective: None available from patient as he is intubated and sedated Partner at bedside Sister at bedside Partner said that patient had a's seizures on Friday or Saturday when he fell after a bout of coughing.  The seizure lasted 15 seconds or so.  And then he recovered completely. He underwent lumbar puncture today.  Medications:   chlorhexidine gluconate (MEDLINE KIT)  15 mL Mouth Rinse BID   Chlorhexidine Gluconate Cloth  6 each Topical Q0600   docusate  100 mg Per Tube BID   feeding supplement (PROSource TF)  90 mL Per Tube TID   folic acid  1 mg Per Tube Daily   free water  30 mL Per Tube Q4H   heparin  5,000 Units Subcutaneous Q8H   ipratropium-albuterol  3 mL Nebulization Q6H   mouth rinse  15 mL Mouth Rinse 10 times per day   metoCLOPramide (REGLAN) injection  10 mg Intravenous Q8H   multivitamin with minerals  1 tablet Per Tube Daily   pantoprazole (PROTONIX) IV  40 mg Intravenous QHS   polyethylene glycol  17 g Per Tube Daily   thiamine injection  100 mg Intravenous Daily    Objective: Vital signs in last 24 hours: Temp:  [98.6 F (37 C)-100.8 F (38.2 C)] 99.5 F (37.5 C) (09/23 1100) Pulse Rate:  [99-110] 108 (09/23 1100) Resp:  [11-20] 20 (09/23 1100) BP: (125-154)/(75-121) 148/75 (09/23 1000) SpO2:  [87 %-100 %] 100 % (09/23 1520) FiO2 (%):  [40 %] 40 % (09/23 1520) Weight:  [130.9 kg] 130.9 kg (09/23 0500)  PHYSICAL EXAM:  General: Intubated and sedated Head: Normocephalic, without obvious abnormality, atraumatic. Eyes: Conjunctivae clear, anicteric sclerae. Pupils are equal ENT Nares normal. No drainage or sinus tenderness. Lips, mucosa, and tongue normal. No Thrush Neck:, symmetrical, no adenopathy, thyroid: non tender no carotid bruit and no JVD. Back: No CVA tenderness. Lungs: Bilateral air entry. Heart:  Tachycardia. Abdomen: Soft, non-tender,not distended. Bowel sounds normal. No masses Extremities: atraumatic, no cyanosis. No edema. No clubbing Skin: Examination. Lymph: Cervical, supraclavicular normal. Neurologic: Cannot be assessed limited Lab Results Recent Labs    11/24/20 0511 11/25/20 0542  WBC 9.2  9.1 12.9*  HGB 13.7  13.8 14.5  HCT 40.9  40.8 42.9  NA 140 143  K 4.1 4.1  CL 101 103  CO2 31 29  BUN 22* 22*  CREATININE 0.78 0.58*    Microbiology: 11/22/1990 blood culture no growth 11/24/2020 sputum culture has been sent 11/25/2020 CSF culture has been sent CSF examination shows 18-27 WBC with predominant lymphocytes 72 to 39%.  RBCs only 5 and tube 3 Protein 146  Studies/Results: DG Abd 1 View  Result Date: 11/25/2020 CLINICAL DATA:  Ileus.  Acute respiratory failure. EXAM: ABDOMEN - 1 VIEW COMPARISON:  Abdominal radiograph dated 11/24/2020. FINDINGS: Enteric tube with tip and side-port in the proximal stomach. No bowel dilatation or evidence of obstruction. Air is noted in the colon. Degenerative changes of the spine. No acute osseous pathology. IMPRESSION: Enteric tube in the proximal stomach. No bowel obstruction. Electronically Signed   By: Elgie Collard M.D.   On: 11/25/2020 02:04   DG Abd 1 View  Result Date: 11/24/2020 CLINICAL DATA:  OG tube placement EXAM: ABDOMEN - 1 VIEW COMPARISON:  None. FINDINGS: Esophagogastric tube with tip and side port below the diaphragm.  Loop of distended colon in the left upper quadrant measuring 6.0 cm in caliber. No free air in the abdomen on supine radiograph. IMPRESSION: 1. Esophagogastric tube with tip and side port below the diaphragm. 2. Loop of distended colon in the left upper quadrant measuring 6.0 cm in caliber, consistent with ileus or obstruction. No free air in the abdomen on supine radiograph. Electronically Signed   By: Eddie Candle M.D.   On: 11/24/2020 12:07   MR BRAIN W CONTRAST  Result Date:  11/25/2020 CLINICAL DATA:  Mental status change, unknown cause. Additional history provided by scanning technologist: Concern for meningitis, seizures. EXAM: MRI HEAD WITH CONTRAST TECHNIQUE: Multiplanar, multiecho pulse sequences of the brain and surrounding structures were obtained with intravenous contrast. CONTRAST:  95mL GADAVIST GADOBUTROL 1 MMOL/ML IV SOLN COMPARISON:  Noncontrast brain MRI 11/22/2020. FINDINGS: Brain: A contrast-enhanced brain MRI was performed as a follow-up to the recent prior noncontrast brain MRI of 11/22/2020. For today's examination the following sequences were acquired: Axial T1 weighted precontrast sequence, axial T1 weighted postcontrast sequence, coronal T1 weighted postcontrast sequence, sagittal T1 weighted postcontrast sequence, coronal T2 TSE postcontrast sequence. Mild generalized cerebral and cerebellar atrophy. No pathologic intracranial enhancement is identified. No intracranial mass or extra-axial fluid collection is identified on the acquired sequences. No midline shift. Vascular: Enhancement within the proximal large arterial vessels. Skull and upper cervical spine: No focal suspicious marrow lesion. Sinuses/Orbits: Visualized orbits show no acute finding. Mild paranasal sinus mucosal thickening, most notably within the left ethmoid air cells. Other: Bilateral mastoid effusions. IMPRESSION: No pathologic intracranial enhancement is identified. Paranasal sinus mucosal thickening, most notably left ethmoidal. Bilateral mastoid effusions. Electronically Signed   By: Kellie Simmering D.O.   On: 11/25/2020 14:05   US Venous Img Lower Bilateral (DVT)  Result Date: 11/24/2020 CLINICAL DATA:  55 year old male with a history of edema EXAM: BILATERAL LOWER EXTREMITY VENOUS DOPPLER ULTRASOUND TECHNIQUE: Gray-scale sonography with graded compression, as well as color Doppler and duplex ultrasound were performed to evaluate the lower extremity deep venous systems from the level of  the common femoral vein and including the common femoral, femoral, profunda femoral, popliteal and calf veins including the posterior tibial, peroneal and gastrocnemius veins when visible. The superficial great saphenous vein was also interrogated. Spectral Doppler was utilized to evaluate flow at rest and with distal augmentation maneuvers in the common femoral, femoral and popliteal veins. COMPARISON:  None. FINDINGS: RIGHT LOWER EXTREMITY Common Femoral Vein: No evidence of thrombus. Normal compressibility, respiratory phasicity and response to augmentation. Saphenofemoral Junction: No evidence of thrombus. Normal compressibility and flow on color Doppler imaging. Profunda Femoral Vein: No evidence of thrombus. Normal compressibility and flow on color Doppler imaging. Femoral Vein: No evidence of thrombus. Normal compressibility, respiratory phasicity and response to augmentation. Popliteal Vein: No evidence of thrombus. Normal compressibility, respiratory phasicity and response to augmentation. Calf Veins: No evidence of thrombus. Normal compressibility and flow on color Doppler imaging. Superficial Great Saphenous Vein: No evidence of thrombus. Normal compressibility and flow on color Doppler imaging. Other Findings: Lentiform fluid collection in the popliteal fossa, estimated 8.0 cm x 2.3 cm compatible with Baker's cyst LEFT LOWER EXTREMITY Common Femoral Vein: No evidence of thrombus. Normal compressibility, respiratory phasicity and response to augmentation. Saphenofemoral Junction: No evidence of thrombus. Normal compressibility and flow on color Doppler imaging. Profunda Femoral Vein: No evidence of thrombus. Normal compressibility and flow on color Doppler imaging. Femoral Vein: No evidence of thrombus. Normal compressibility, respiratory phasicity and response to  augmentation. Popliteal Vein: No evidence of thrombus. Normal compressibility, respiratory phasicity and response to augmentation. Calf Veins:  No evidence of thrombus. Normal compressibility and flow on color Doppler imaging. Superficial Great Saphenous Vein: No evidence of thrombus. Normal compressibility and flow on color Doppler imaging. Other Findings:  None. IMPRESSION: Sonographic survey of the bilateral lower extremities negative for DVT Right Baker's cyst Electronically Signed   By: Corrie Mckusick D.O.   On: 11/24/2020 10:26   DG Chest Port 1 View  Result Date: 11/25/2020 CLINICAL DATA:  Respiratory failure. EXAM: PORTABLE CHEST 1 VIEW COMPARISON:  Chest radiograph dated 11/22/2020. FINDINGS: Endotracheal tube approximately 7 cm above the carina. Enteric tube extends below the diaphragm. Small left pleural effusion and left lung base atelectasis or infiltrate. There is cardiomegaly with vascular congestion. No pneumothorax. No acute osseous pathology. Old healed right posterior rib fractures. IMPRESSION: 1. Cardiomegaly with vascular congestion. 2. Small left pleural effusion and left lung base atelectasis or infiltrate. Electronically Signed   By: Anner Crete M.D.   On: 11/25/2020 02:03   DG FL GUIDED LUMBAR PUNCTURE  Result Date: 11/25/2020 CLINICAL DATA:  Altered mental status changes request received for lumbar puncture. EXAM: DIAGNOSTIC LUMBAR PUNCTURE UNDER FLUOROSCOPIC GUIDANCE COMPARISON:  Previous MRI brain imaging 11/22/2020 and lumbar spine imaging 11/21/2020 was reviewed prior to the procedure. FLUOROSCOPY TIME:  Fluoroscopy Time:  0.2 minute Radiation Exposure Index (if provided by the fluoroscopic device): 6.7 mGy Number of Acquired Spot Images: 2 PROCEDURE: Informed consent was obtained from the patient's spouse prior to the procedure, including potential complications of CSF leak with need for additional procedures such as blood patch, nerve damage, headache, infection, and bleeding. With the patient prone, the lower back was prepped with Betadine. 1% Lidocaine was used for local anesthesia. Lumbar puncture was performed  at the L3-L4 level using a 22 gauge needle with return of pale yellow CSF with an opening pressure of 26 cm water. 9 ml of CSF were obtained for laboratory studies. The patient tolerated the procedure well and there were no apparent complications. IMPRESSION: Technically successful fluoroscopic guided lumbar puncture. Read By: Tsosie Billing PA-C Electronically Signed   By: Kathreen Devoid M.D.   On: 11/25/2020 12:55     Assessment/Plan: Patient admitted with altered sensorium, history of excess alcohol use Delirium tremens Hypoxia Intubated and sedated History of fall with fracture L1 Fracture ribs right side Persistent fever since hospitalization.  Left lower lobe infiltrate developed in the hospital.  Concern for aspiration pneumonia.  Currently antibiotic changed to meropenem since yesterday. : Ileus versus obstruction Constipation for more than 1 week.   CSF is not indicated for any type of infection Patient currently on acyclovir.  HSV PCR has been sent.  Unlikely to be herpes encephalitis because MRI with contrast does not show any temporal lobe enhancement, there are no RBCs in the CSF and the WBC in the CSF is only 5-18.  Protein is high at 148.  We will continue acyclovir until HSV PCR is available.  If the creatinine starts to increase then we can stop the acyclovir. The CSF lower lymphocytic pleocytosis could be from seizures that he had at home.  Discussed the management in great detail with his partner, sister and with the care team.

## 2020-11-25 NOTE — TOC Initial Note (Signed)
Transition of Care [facility]) - Initial/Assessment Note    Patient Details  Name: Raul Torrance MRN: [id number] Date of Birth: [birth year]  [facility] of [facility]) CM/SW Contact:    Hetty Ely, RN Phone Number: 11/25/2020, 11:37 AM  Clinical Narrative:  On vent, NMS for LP, MRI and possible EEG today. TOC will continue to track for discharge needs.                       Patient Goals and CMS Choice        Expected Discharge Plan and Services                                                Prior Living Arrangements/Services                       Activities of Daily Living      Permission Sought/Granted                  Emotional Assessment              Admission diagnosis:  Alcohol withdrawal (HCC) [F10.239] SOB (shortness of breath) [R06.02] Pain [R52] Altered mental status, unspecified altered mental status type [R41.82] Patient Active Problem List   Diagnosis Date Noted   Alcohol withdrawal (HCC) 11/21/2020   PCP:  Patient, No Pcp Per (Inactive) Pharmacy:   [facility] - [place],  - [address] [place] [address] Phone: [phone] Fax: [phone]     Social Determinants of Health (SDOH) Interventions    Readmission Risk Interventions No flowsheet data found.

## 2020-11-25 NOTE — Consult Note (Signed)
Pharmacy Antibiotic Note  Seth Harper is a 55 y.o. male with medical history including EtOH abuse admitted on 11/21/2020 with  EtOH withdrawal, back pain, thumb pain, productive dark colored sputum . Patient was intubated 9/19 and remains intubated, sedated, and on mechanical ventilation in the ICU. Patient was started on CAP regimen with ceftriaxone and azithromycin for suspected pneumonia but continues to have fevers despite therapy. ID has now been consulted and broad differential is being considered including meningitis. Pharmacy has been consulted for acyclovir dosing for HSV encephalitis coverage. Ceftriaxone was broadened to meropenem 9/22.   Plan: Acyclovir 955 mg IV q8h (10 mg/kg AdjBw)  Height: 6\' 3"  (190.5 cm) Weight: 130.9 kg (288 lb 9.3 oz) IBW/kg (Calculated) : 84.5  Temp (24hrs), Avg:99.8 F (37.7 C), Min:98.6 F (37 C), Max:101.3 F (38.5 C)  Recent Labs  Lab 11/21/20 1007 11/21/20 1015 11/21/20 2105 11/21/20 2349 11/22/20 0355 11/23/20 0458 11/24/20 0511 11/24/20 1434 11/24/20 1726 11/25/20 0542  WBC 15.6*  --   --   --  10.3  --  9.2  9.1  --   --  12.9*  CREATININE 1.08  --   --   --  0.79 0.70 0.78  --   --  0.58*  LATICACIDVEN  --    < > 1.5 0.9  --   --   --  0.9 1.0 2.0*   < > = values in this interval not displayed.     Estimated Creatinine Clearance: 155.7 mL/min (A) (by C-G formula based on SCr of 0.58 mg/dL (L)).    No Known Allergies  Antimicrobials this admission: Vancomycin 9/19 x 1 Ceftriaxone 9/19 >> 9/22 Azithromycin 9/19 >> 9/23 Meropenem 9/22 >>  Acyclovir 9/22 >>    Microbiology results: 9/23 LP: Pending 9/22 Tracheal aspirate: pending  9/19 BCx: NGTD 9/19 MRSA PCR: (-)  Thank you for allowing pharmacy to be a part of this patient's care.  10/19, PharmD, BCPS Clinical Pharmacist 11/25/2020 12:20 PM

## 2020-11-25 NOTE — Progress Notes (Signed)
[facility]  Abnormal rhythmic motor movements Not clearly seizure Give versed EEG in AM  Heber [place], MD [place] [facility] Pager: [phone] Cell: [phone] After 7:00 pm call Elink  [phone]

## 2020-11-26 DIAGNOSIS — E46 Unspecified protein-calorie malnutrition: Secondary | ICD-10-CM

## 2020-11-26 LAB — CBC WITH DIFFERENTIAL/PLATELET
Abs Immature Granulocytes: 0.31 10*3/uL — ABNORMAL HIGH (ref 0.00–0.07)
Basophils Absolute: 0.1 10*3/uL (ref 0.0–0.1)
Basophils Relative: 0 %
Eosinophils Absolute: 0.1 10*3/uL (ref 0.0–0.5)
Eosinophils Relative: 1 %
HCT: 39.5 % (ref 39.0–52.0)
Hemoglobin: 13.3 g/dL (ref 13.0–17.0)
Immature Granulocytes: 2 %
Lymphocytes Relative: 16 %
Lymphs Abs: 2 10*3/uL (ref 0.7–4.0)
MCH: 36 pg — ABNORMAL HIGH (ref 26.0–34.0)
MCHC: 33.7 g/dL (ref 30.0–36.0)
MCV: 107 fL — ABNORMAL HIGH (ref 80.0–100.0)
Monocytes Absolute: 1.1 10*3/uL — ABNORMAL HIGH (ref 0.1–1.0)
Monocytes Relative: 9 %
Neutro Abs: 9.1 10*3/uL — ABNORMAL HIGH (ref 1.7–7.7)
Neutrophils Relative %: 72 %
Platelets: 225 10*3/uL (ref 150–400)
RBC: 3.69 MIL/uL — ABNORMAL LOW (ref 4.22–5.81)
RDW: 13.7 % (ref 11.5–15.5)
WBC: 12.7 10*3/uL — ABNORMAL HIGH (ref 4.0–10.5)
nRBC: 0 % (ref 0.0–0.2)

## 2020-11-26 LAB — GLUCOSE, CAPILLARY
Glucose-Capillary: 104 mg/dL — ABNORMAL HIGH (ref 70–99)
Glucose-Capillary: 105 mg/dL — ABNORMAL HIGH (ref 70–99)
Glucose-Capillary: 106 mg/dL — ABNORMAL HIGH (ref 70–99)
Glucose-Capillary: 107 mg/dL — ABNORMAL HIGH (ref 70–99)
Glucose-Capillary: 111 mg/dL — ABNORMAL HIGH (ref 70–99)
Glucose-Capillary: 112 mg/dL — ABNORMAL HIGH (ref 70–99)
Glucose-Capillary: 119 mg/dL — ABNORMAL HIGH (ref 70–99)

## 2020-11-26 LAB — PROCALCITONIN: Procalcitonin: 2.17 ng/mL

## 2020-11-26 LAB — CULTURE, BLOOD (ROUTINE X 2)
Culture: NO GROWTH
Culture: NO GROWTH
Special Requests: ADEQUATE

## 2020-11-26 LAB — HEPATIC FUNCTION PANEL
ALT: 32 U/L (ref 0–44)
AST: 41 U/L (ref 15–41)
Albumin: 2 g/dL — ABNORMAL LOW (ref 3.5–5.0)
Alkaline Phosphatase: 102 U/L (ref 38–126)
Bilirubin, Direct: 0.4 mg/dL — ABNORMAL HIGH (ref 0.0–0.2)
Indirect Bilirubin: 0.5 mg/dL (ref 0.3–0.9)
Total Bilirubin: 0.9 mg/dL (ref 0.3–1.2)
Total Protein: 6 g/dL — ABNORMAL LOW (ref 6.5–8.1)

## 2020-11-26 LAB — BASIC METABOLIC PANEL
Anion gap: 9 (ref 5–15)
BUN: 27 mg/dL — ABNORMAL HIGH (ref 6–20)
CO2: 29 mmol/L (ref 22–32)
Calcium: 7.8 mg/dL — ABNORMAL LOW (ref 8.9–10.3)
Chloride: 108 mmol/L (ref 98–111)
Creatinine, Ser: 0.73 mg/dL (ref 0.61–1.24)
GFR, Estimated: >60 mL/min (ref >60)
Glucose, Bld: 120 mg/dL — ABNORMAL HIGH (ref 70–99)
Potassium: 4.1 mmol/L (ref 3.5–5.1)
Sodium: 146 mmol/L — ABNORMAL HIGH (ref 135–145)

## 2020-11-26 LAB — RPR: RPR Ser Ql: NONREACTIVE

## 2020-11-26 LAB — TRIGLYCERIDES: Triglycerides: 269 mg/dL — ABNORMAL HIGH (ref <150)

## 2020-11-26 LAB — CULTURE, RESPIRATORY W GRAM STAIN: Culture: NORMAL

## 2020-11-26 LAB — MAGNESIUM: Magnesium: 2.8 mg/dL — ABNORMAL HIGH (ref 1.7–2.4)

## 2020-11-26 LAB — PHOSPHORUS: Phosphorus: 3.4 mg/dL (ref 2.5–4.6)

## 2020-11-26 MED ORDER — DEXMEDETOMIDINE HCL IN NACL 400 MCG/100ML IV SOLN
0.4000 ug/kg/h | INTRAVENOUS | Status: DC
  Administered 2020-11-26: 1.2 ug/kg/h via INTRAVENOUS
  Administered 2020-11-26: 0.4 ug/kg/h via INTRAVENOUS
  Administered 2020-11-26: 1.2 ug/kg/h via INTRAVENOUS
  Administered 2020-11-26: 1.1 ug/kg/h via INTRAVENOUS
  Administered 2020-11-27 – 2020-11-28 (×12): 1.2 ug/kg/h via INTRAVENOUS
  Filled 2020-11-26 (×18): qty 100

## 2020-11-26 MED ORDER — MIDAZOLAM HCL 2 MG/2ML IJ SOLN
2.0000 mg | INTRAMUSCULAR | Status: DC | PRN
  Administered 2020-11-26 – 2020-11-28 (×8): 2 mg via INTRAVENOUS
  Filled 2020-11-26 (×8): qty 2

## 2020-11-26 NOTE — Consult Note (Signed)
PHARMACY CONSULT NOTE  Pharmacy Consult for Electrolyte Monitoring and Replacement   Recent Labs: Potassium (mmol/L)  Date Value  11/26/2020 4.1   Magnesium (mg/dL)  Date Value  44/31/5400 2.8 (H)   Calcium (mg/dL)  Date Value  86/76/1950 7.8 (L)   Albumin (g/dL)  Date Value  93/26/7124 2.0 (L)   Phosphorus (mg/dL)  Date Value  58/11/9831 3.4   Sodium (mmol/L)  Date Value  11/26/2020 146 (H)   Assessment: Patient is a 55 y/o M with medical history including EtOH abuse who presented to the ED 9/19 with chief complaint of SOB, dark colored sputum, back pain and thumb pain. While in the ED, patient began experiencing hallucinations and encephalopathy. Patient ultimately required intubation 9/19. Patient is now admitted to the ICU where he remains intubated, sedated, and on mechanical ventilation. Pharmacy consulted to assist with electrolyte monitoring and replacement as indicated.  Nutrition: Tube feeds started 9/20  Goal of Therapy:  Electrolytes within normal limits  Plan:  --No electrolyte replacement warranted at this time --Continue to follow  Albina Billet, PharmD, BCPS Clinical Pharmacist 11/26/2020 7:11 AM

## 2020-11-26 NOTE — Procedures (Addendum)
Patient Name: Seth Harper  MRN: [id number]  Epilepsy Attending: Charlsie Quest  Referring Physician/Provider: Dr Max Fickle Date: 11/25/2020 Duration: 24.33 mins  Patient history: Seth Harper is a 54 year old male with history of alcohol abuse/dependence who presented to the ER on 9/19 with progressive shortness of breath, dark colored sputum, back pain and thumb pain. He started to have hallucinations with increasing altered mentation while in the ER. EEG to evaluate for seizure  Level of alertness: comatose  AEDs during EEG study: Versed, propofol  Technical aspects: This EEG study was done with scalp electrodes positioned according to the 10-20 International system of electrode placement. Electrical activity was acquired at a sampling rate of 500Hz  and reviewed with a high frequency filter of 70Hz  and a low frequency filter of 1Hz . EEG data were recorded continuously and digitally stored.   Description: EEG showed continuous generalized 3 to 6 Hz theta-delta slowing admixed with 15-18Hz  generalized beta activity.  Hyperventilation and photic stimulation were not performed.     ABNORMALITY - Continuous slow, generalized  IMPRESSION: This study is suggestive of severe diffuse encephalopathy, nonspecific etiology but likely related to sedation. No seizures or epileptiform discharges were seen throughout the recording.  Seth Harper 

## 2020-11-26 NOTE — Progress Notes (Signed)
NAME:  Seth Harper, MRN:  [id number], DOB:  05/21/1965, LOS: 5 ADMISSION DATE:  11/21/2020, CONSULTATION DATE:  11/21/20 REFERRING MD:  Conni Slipper, MD, CHIEF COMPLAINT:  Altered Mentation   History of Present Illness:  Seth Harper is a 55 year old male with history of alcohol abuse/dependence who presented to the ER on 9/19 with progressive shortness of breath, dark colored sputum, back pain and thumb pain. He started to have hallucinations with increasing altered mentation while in the ER. Further history obtained by the ER nursing staff from the patient's significant other who reports the patient fell last Friday and lost consciousness. She says he has not slept in days, usually drinks 1.5 gallons of rum per week and his last drink was on Friday prior to the fall. Due to his increasing agitation and altered mentation he was sedated and intubated in the ER. PCCM has been called to admit the patient.   Pertinent  Medical History  EtOH abuse/dependence  Significant Hospital Events: Including procedures, antibiotic start and stop dates in addition to other pertinent events   9/19 Intubated, admitted to the ICU.  LP unsuccessful in the ED.  CT MRI head and EEG negative 9/20 changed to PCV due to vent dyssynchrony, lactic acid improved 9/22: Persistent fevers, recollect Tracheal aspirate, Doppler US BLE, consult ID.  KUB concerning for ileus vs SBO 9/23: Ileus resolved, to go for Fluro guided LP and MRI Brain w/ contrast, ABX changed to Meropenem, Acyclovir added 9/24: Uneventful night, started Precedex to wean off propofol and Versed  Interim History / Subjective:  -Had LP yesterday -HSV PCR in CSF pending -MRI brain from yesterday unremarkable -Ileus resolved -T-max last night 101.7 - EEG showed no seizure activity just encephalopathy  Objective   Blood pressure 113/75, pulse 93, temperature 99.3 F (37.4 C), resp. rate 20, height _0  (1.905 m), weight 131.4 kg, SpO2 96 %.     Vent Mode: PCV FiO2 (%):  [40 %] 40 % Set Rate:  [20 bmp] 20 bmp PEEP:  [5 cmH20] 5 cmH20 Plateau Pressure:  [15 cmH20-18 cmH20] 18 cmH20   Intake/Output Summary (Last 24 hours) at 11/26/2020 1024 Last data filed at 11/26/2020 0600 Gross per 24 hour  Intake 4800.98 ml  Output 2550 ml  Net 2250.98 ml    Filed Weights   11/21/20 1001 11/25/20 0500 11/26/20 0415  Weight: 111.6 kg 130.9 kg 131.4 kg    Examination: Blood pressure (!) 151/80, pulse (!) 112, temperature (!) 100.8 F (38.2 C), resp. rate (!) 26, height _1  (1.905 m), weight 111.6 kg, SpO2 94 %. Gen:      Acutely ill appearing male, laying in bed, intubated and sedated, synchronous with the ventilator  HEENT:  EOMI, sclera anicteric, atraumatic, normocephalic Neck:     No masses; no thyromegaly, ETT Lungs:    Mechanical breath sounds bilaterally; no wheezing, synchronous with the vent, even CV:         Tachycardia, Regular rhythm; no murmurs, rubs, or gallops Abd:     + bowel sounds; soft, non-tender; no palpable masses, no distension Ext:    No edema; adequate peripheral perfusion Skin:      Warm and dry; no rash Neuro:  Sedated, withdraws from pain, Pupils PERRLA  Scheduled Meds:  chlorhexidine gluconate (MEDLINE KIT)  15 mL Mouth Rinse BID   Chlorhexidine Gluconate Cloth  6 each Topical Q0600   docusate  100 mg Per Tube BID   feeding supplement (PROSource TF)  90 mL Per Tube TID   folic acid  1 mg Per Tube Daily   free water  30 mL Per Tube Q4H   heparin  5,000 Units Subcutaneous Q8H   ipratropium-albuterol  3 mL Nebulization Q6H   mouth rinse  15 mL Mouth Rinse 10 times per day   metoCLOPramide (REGLAN) injection  10 mg Intravenous Q8H   multivitamin with minerals  1 tablet Per Tube Daily   pantoprazole (PROTONIX) IV  40 mg Intravenous QHS   polyethylene glycol  17 g Per Tube Daily   thiamine injection  100 mg Intravenous Daily   Continuous Infusions:  sodium chloride 125 mL/hr at 11/26/20 0600    acyclovir 955 mg (11/26/20 0800)   feeding supplement (VITAL 1.5 CAL) 1,000 mL (11/25/20 1703)   fentaNYL infusion INTRAVENOUS 150 mcg/hr (11/26/20 0600)   ibuprofen (CALDOLOR) IV Stopped (11/24/20 0718)   meropenem (MERREM) IV Stopped (11/26/20 0559)   midazolam 5.5 mg/hr (11/26/20 0600)   propofol (DIPRIVAN) infusion 15 mcg/kg/min (11/26/20 0938)   PRN Meds:.acetaminophen, albuterol, fentaNYL, ibuprofen (CALDOLOR) IV, midazolam, midazolam   Resolved Hospital Problem list     Assessment & Plan:   Acute encephalopathy Alcohol Withdrawal with delirium ? Seizure activity on 11/24/20, EEG shows no seizure activity Sedation needs in the setting of mechanical ventilation/alcohol withdrawal -Maintain a RASS goal of -1 to -2 -Precedex to wean propofol and Versed off, wean fentanyl off as tolerated -Avoid sedating medications as able -Daily wake up assessment -Continue CIWA protocol -Continue thiamine, folate -MRI Brain negative on 11/22/20 -MRI Brain w contrast 9/23 unremarkable, LP unremarkable HSV PCR pending -EEG showed changes consistent with encephalopathy, no seizure activity  Acute Hypoxemic Respiratory Failure due to Community Acquired Pneumonia Emphysema -Full vent support, implement lung protective strategies -Plateau pressures less than 30 cm H20 -Wean FiO2 & PEEP as tolerated to maintain O2 sats >88% -Follow intermittent Chest X-ray & ABG as needed -Spontaneous Breathing Trials when respiratory parameters met and mental status permits -Implement VAP Bundle -Bronchodilators -ABX changed to Meropenem per ID, Azithromycin continued  Sepsis present on admission secondary to community-acquired pneumonia Persistent Fevers Meningitis ruled out Patient with fever, tachycardia, tachypnea, altered mentation and elevated WBC count with unkown source at this time. LP unsuccessful in the ER. Possibly related to withdrawal vs infection  -Monitor fever curve -Trend WBC's &  Procalcitonin -Follow cultures as above -Continue empiric Meropenem & Acyclovir pending cultures & sensitivities -ID following, appreciate input ~ will defer antibiotics to ID recommendations -Repeat tracheal aspirate with few gram + rods &cocci, few gram - rods -Venous US of BLE negative for DVT on 9/22 -LP by IR and MRI w/ contrast unrevealing -Discontinue metoclopramide as this can cause fever  Concern for Ileus vs. Small Bowel Obstruction>>resolved -NPO, advance tube feeds as tolerated after today's imaging/procedures -Discontinue Reglan   L1 Compression Fracture s/p fall at home Conservative management, pain control  Severe malnutrition due to chronic alcohol use Albumin 2.0 Tube feeds per nutrition  Protein supplements  Best Practice (right click and "Reselect all SmartList Selections" daily)   Diet/type: NPO DVT prophylaxis: prophylactic heparin  GI prophylaxis: PPI Lines: N/A Foley:  yes, and is still needed Code Status:  full code Last date of multidisciplinary goals of care discussion [11/25/2020]    Critical care time: 40 minutes   The patient is critically ill with multiple organ systems failure and requires high complexity decision making for assessment and support, frequent evaluation and titration of therapies, application of advanced  monitoring technologies and extensive interpretation of multiple databases. Critical Care Time devoted to patient care services described in this note is 40 minutes.  Renold Don, MD Advanced Bronchoscopy PCCM [facility]    *This note was dictated using voice recognition software/Dragon.  Despite best efforts to proofread, errors can occur which can change the meaning.  Any change was purely unintentional.

## 2020-11-27 ENCOUNTER — Inpatient Hospital Stay: Payer: Self-pay

## 2020-11-27 DIAGNOSIS — J9602 Acute respiratory failure with hypercapnia: Secondary | ICD-10-CM

## 2020-11-27 LAB — CBC WITH DIFFERENTIAL/PLATELET
Abs Immature Granulocytes: 0.14 10*3/uL — ABNORMAL HIGH (ref 0.00–0.07)
Basophils Absolute: 0.1 10*3/uL (ref 0.0–0.1)
Basophils Relative: 1 %
Eosinophils Absolute: 0.1 10*3/uL (ref 0.0–0.5)
Eosinophils Relative: 1 %
HCT: 40.4 % (ref 39.0–52.0)
Hemoglobin: 13.5 g/dL (ref 13.0–17.0)
Immature Granulocytes: 1 %
Lymphocytes Relative: 18 %
Lymphs Abs: 1.8 10*3/uL (ref 0.7–4.0)
MCH: 35.9 pg — ABNORMAL HIGH (ref 26.0–34.0)
MCHC: 33.4 g/dL (ref 30.0–36.0)
MCV: 107.4 fL — ABNORMAL HIGH (ref 80.0–100.0)
Monocytes Absolute: 0.9 10*3/uL (ref 0.1–1.0)
Monocytes Relative: 8 %
Neutro Abs: 7.3 10*3/uL (ref 1.7–7.7)
Neutrophils Relative %: 71 %
Platelets: 274 10*3/uL (ref 150–400)
RBC: 3.76 MIL/uL — ABNORMAL LOW (ref 4.22–5.81)
RDW: 13.3 % (ref 11.5–15.5)
WBC: 10.2 10*3/uL (ref 4.0–10.5)
nRBC: 0 % (ref 0.0–0.2)

## 2020-11-27 LAB — BASIC METABOLIC PANEL
Anion gap: 11 (ref 5–15)
BUN: 24 mg/dL — ABNORMAL HIGH (ref 6–20)
CO2: 28 mmol/L (ref 22–32)
Calcium: 8.1 mg/dL — ABNORMAL LOW (ref 8.9–10.3)
Chloride: 108 mmol/L (ref 98–111)
Creatinine, Ser: 0.67 mg/dL (ref 0.61–1.24)
GFR, Estimated: >60 mL/min (ref >60)
Glucose, Bld: 109 mg/dL — ABNORMAL HIGH (ref 70–99)
Potassium: 4.1 mmol/L (ref 3.5–5.1)
Sodium: 147 mmol/L — ABNORMAL HIGH (ref 135–145)

## 2020-11-27 LAB — PROCALCITONIN: Procalcitonin: 1.18 ng/mL

## 2020-11-27 LAB — GLUCOSE, CAPILLARY
Glucose-Capillary: 113 mg/dL — ABNORMAL HIGH (ref 70–99)
Glucose-Capillary: 101 mg/dL — ABNORMAL HIGH (ref 70–99)
Glucose-Capillary: 94 mg/dL (ref 70–99)
Glucose-Capillary: 109 mg/dL — ABNORMAL HIGH (ref 70–99)
Glucose-Capillary: 94 mg/dL (ref 70–99)

## 2020-11-27 LAB — PHOSPHORUS: Phosphorus: 3 mg/dL (ref 2.5–4.6)

## 2020-11-27 LAB — MAGNESIUM: Magnesium: 2.4 mg/dL (ref 1.7–2.4)

## 2020-11-27 MED ORDER — METHYLNALTREXONE BROMIDE 12 MG/0.6ML ~~LOC~~ SOLN
12.0000 mg | Freq: Once | SUBCUTANEOUS | Status: AC
  Administered 2020-11-27: 12 mg via SUBCUTANEOUS
  Filled 2020-11-27: qty 0.6

## 2020-11-27 MED ORDER — CHLORDIAZEPOXIDE HCL 25 MG PO CAPS
25.0000 mg | ORAL_CAPSULE | Freq: Three times a day (TID) | ORAL | Status: DC
  Administered 2020-11-27 – 2020-11-28 (×4): 25 mg
  Filled 2020-11-27 (×4): qty 1

## 2020-11-27 MED ORDER — METOCLOPRAMIDE HCL 5 MG/ML IJ SOLN
5.0000 mg | Freq: Three times a day (TID) | INTRAMUSCULAR | Status: DC
  Administered 2020-11-27 – 2020-11-29 (×6): 5 mg via INTRAVENOUS
  Filled 2020-11-27 (×6): qty 2

## 2020-11-27 NOTE — Progress Notes (Signed)
NAME:  Seth Harper, MRN:  [id number], DOB:  06/02/1965, LOS: 6 ADMISSION DATE:  11/21/2020, CONSULTATION DATE:  11/21/20 REFERRING MD:  Dorothea Glassman, MD, CHIEF COMPLAINT:  Altered Mentation   History of Present Illness:  Seth Harper is a 55 year old male with history of alcohol abuse/dependence who presented to the ER on 9/19 with progressive shortness of breath, dark colored sputum, back pain and thumb pain. He started to have hallucinations with increasing altered mentation while in the ER. Further history obtained by the ER nursing staff from the patient's significant other who reports the patient fell last Friday and lost consciousness. She says he has not slept in days, usually drinks 1.5 gallons of rum per week and his last drink was on Friday prior to the fall. Due to his increasing agitation and altered mentation he was sedated and intubated in the ER. PCCM has been called to admit the patient.   Pertinent  Medical History  EtOH abuse/dependence  Significant Hospital Events: Including procedures, antibiotic start and stop dates in addition to other pertinent events   9/19 Intubated, admitted to the ICU.  LP unsuccessful in the ED.  CT MRI head and EEG negative 9/20 changed to PCV due to vent dyssynchrony, lactic acid improved 9/22: Persistent fevers, recollect Tracheal aspirate, Doppler US BLE, consult ID.  KUB concerning for ileus vs SBO 9/23: Ileus resolved, to go for Fluro guided LP and MRI Brain w/ contrast, ABX changed to Meropenem, Acyclovir added 9/24: Uneventful night, started Precedex to wean off propofol and Versed 9/25: Agitated off midazolam, on Precedex, had to start some propofol.  Initiated through [employer]  Interim History / Subjective:  -Had LP 9/23 -HSV PCR in CSF pending -MRI brain from yesterday unremarkable -Had increased gastric residuals had to restart Reglan at lower dose -Afebrile yesterday off of Reglan - EEG showed no seizure activity just  encephalopathy  Objective   Blood pressure 129/82, pulse 83, temperature 98.8 F (37.1 C), resp. rate 18, height 6\' 3"  (1.905 m), weight 131.3 kg, SpO2 94 %.    Vent Mode: PCV FiO2 (%):  [40 %-45 %] 45 % Set Rate:  [20 bmp] 20 bmp PEEP:  [5 cmH20] 5 cmH20   Intake/Output Summary (Last 24 hours) at 11/27/2020 1051 Last data filed at 11/27/2020 0900 Gross per 24 hour  Intake 6365.74 ml  Output 2525 ml  Net 3840.74 ml    Filed Weights   11/25/20 0500 11/26/20 0415 11/27/20 0500  Weight: 130.9 kg 131.4 kg 131.3 kg    Examination: BP 129/82   Pulse 83   Temp 98.8 F (37.1 C)   Resp 18   Ht 6\' 3"  (1.905 m)   Wt 131.3 kg   SpO2 94%   BMI 36.18 kg/m  Gen:      Acutely ill appearing male, laying in bed, intubated and sedated, synchronous with the ventilator  HEENT:  EOMI, sclera anicteric, atraumatic, normocephalic Neck:     No masses; no thyromegaly, ETT Lungs:    Mechanical breath sounds bilaterally; no wheezing, synchronous with the vent, even CV:         Tachycardia, Regular rhythm; no murmurs, rubs, or gallops Abd:     + bowel sounds; soft, non-tender; no palpable masses, no distension Ext:    No edema; adequate peripheral perfusion Skin:      Warm and dry; no rash Neuro:  Sedated, withdraws from pain, Pupils PERRLA  Scheduled Meds:  chlorhexidine gluconate (MEDLINE KIT)  15 mL  Mouth Rinse BID   Chlorhexidine Gluconate Cloth  6 each Topical Q0600   docusate  100 mg Per Tube BID   feeding supplement (PROSource TF)  90 mL Per Tube TID   folic acid  1 mg Per Tube Daily   free water  30 mL Per Tube Q4H   heparin  5,000 Units Subcutaneous Q8H   ipratropium-albuterol  3 mL Nebulization Q6H   mouth rinse  15 mL Mouth Rinse 10 times per day   multivitamin with minerals  1 tablet Per Tube Daily   pantoprazole (PROTONIX) IV  40 mg Intravenous QHS   polyethylene glycol  17 g Per Tube Daily   thiamine injection  100 mg Intravenous Daily   Continuous Infusions:  sodium  chloride 75 mL/hr at 11/27/20 0900   acyclovir Stopped (11/27/20 0848)   dexmedetomidine (PRECEDEX) IV infusion 1.2 mcg/kg/hr (11/27/20 0930)   feeding supplement (VITAL 1.5 CAL) 55 mL/hr at 11/27/20 0300   fentaNYL infusion INTRAVENOUS 150 mcg/hr (11/27/20 0900)   ibuprofen (CALDOLOR) IV Stopped (11/24/20 0718)   meropenem (MERREM) IV Stopped (11/27/20 3559)   propofol (DIPRIVAN) infusion 45 mcg/kg/min (11/27/20 0928)   PRN Meds:.acetaminophen, albuterol, fentaNYL, ibuprofen (CALDOLOR) IV, midazolam   Resolved Hospital Problem list   Ileus  Assessment & Plan:   Acute encephalopathy Alcohol Withdrawal with delirium ? Seizure activity on 11/24/20, EEG shows no seizure activity Sedation needs in the setting of mechanical ventilation/alcohol withdrawal -Maintain a RASS goal of -1 to -2 -Continue Precedex, attempt to wean propofol, wean fentanyl off as tolerated -Librium via OG -Avoid sedating medications as able -Daily wake up assessment -Continue CIWA protocol -Continue thiamine, folate -MRI Brain negative on 11/22/20 -MRI Brain w contrast 9/23 unremarkable, LP unremarkable HSV PCR pending -Discontinue acyclovir if HSV PCR negative -EEG showed changes consistent with encephalopathy, no seizure activity  Acute Hypoxemic Respiratory Failure due to Community Acquired Pneumonia Emphysema -Full vent support, implement lung protective strategies -Plateau pressures less than 30 cm H20 -Wean FiO2 & PEEP as tolerated to maintain O2 sats >88% -Follow intermittent Chest X-ray & ABG as needed -Spontaneous Breathing Trials when respiratory parameters met and mental status permits -Implement VAP Bundle -Bronchodilators, standing dose -ABX changed to Meropenem per ID  Sepsis present on admission secondary to community-acquired pneumonia Persistent Fevers Meningitis ruled out Patient with fever, tachycardia, tachypnea, altered mentation and elevated WBC count with unkown source at this  time. LP unsuccessful in the ER. Possibly related to withdrawal vs infection  -Monitor fever curve -Trend WBC's & Procalcitonin -Follow cultures as above -Continue empiric Meropenem & Acyclovir pending cultures & sensitivities -ID following, appreciate input ~ will defer antibiotics to ID recommendations -Repeat tracheal aspirate with few gram + rods &cocci, few gram - rods -Venous US of BLE negative for DVT on 9/22 -LP by IR and MRI w/ contrast unrevealing -Discontinued metoclopramide as this can cause fever however, patient had increased residuals today reintroduce Reglan at lower dose  Concern for Ileus vs. Small Bowel Obstruction>>resolved -NPO, advance tube feeds as tolerated after today's imaging/procedures -Discontinue Reglan   L1 Compression Fracture s/p fall at home Conservative management, pain control  Severe malnutrition due to chronic alcohol use Albumin 2.0, 9/24 Tube feeds per nutrition  Protein supplements  Best Practice (right click and "Reselect all SmartList Selections" daily)   Diet/type: NPO, tube feeds DVT prophylaxis: prophylactic heparin  GI prophylaxis: PPI Lines: N/A Foley:  yes, and is still needed Code Status:  full code Last date of multidisciplinary goals  of care discussion [11/25/2020]    Critical care time: 40 minutes   The patient is critically ill with multiple organ systems failure and requires high complexity decision making for assessment and support, frequent evaluation and titration of therapies, application of advanced monitoring technologies and extensive interpretation of multiple databases. Critical Care Time devoted to patient care services described in this note is 40 minutes.  Renold Don, MD Advanced Bronchoscopy PCCM [facility]    *This note was dictated using voice recognition software/Dragon.  Despite best efforts to proofread, errors can occur which can change the meaning.  Any change was purely  unintentional.

## 2020-11-27 NOTE — Consult Note (Signed)
Client remains intubated.  Psych will assess when client is not intubated and able to engage.  Nanine Means, PMHNP

## 2020-11-27 NOTE — Consult Note (Signed)
PHARMACY CONSULT NOTE  Pharmacy Consult for Electrolyte Monitoring and Replacement   Recent Labs: Potassium (mmol/L)  Date Value  11/27/2020 4.1   Magnesium (mg/dL)  Date Value  36/62/9476 2.4   Calcium (mg/dL)  Date Value  54/65/0354 8.1 (L)   Albumin (g/dL)  Date Value  65/68/1275 2.0 (L)   Phosphorus (mg/dL)  Date Value  17/00/1749 3.0   Sodium (mmol/L)  Date Value  11/27/2020 147 (H)   Assessment: Patient is a 55 y/o M with medical history including EtOH abuse who presented to the ED 9/19 with chief complaint of SOB, dark colored sputum, back pain and thumb pain. While in the ED, patient began experiencing hallucinations and encephalopathy. Patient ultimately required intubation 9/19. Patient is now admitted to the ICU where he remains intubated, sedated, and on mechanical ventilation. Pharmacy consulted to assist with electrolyte monitoring and replacement as indicated.  Nutrition: Tube feeds started 9/20  Goal of Therapy:  Electrolytes within normal limits  Plan:  --No electrolyte replacement warranted at this time --Continue to follow  Albina Billet, PharmD, BCPS Clinical Pharmacist 11/27/2020 7:29 AM

## 2020-11-27 NOTE — TOC Progression Note (Signed)
Transition of Care [facility]) - Progression Note    Patient Details  Name: Seth Harper MRN: [id number] Date of Birth: 11/29/1965  Transition of Care [facility]) CM/SW Contact  Marina Goodell Phone Number: [phone] 11/27/2020, 2:48 PM  Clinical Narrative:     Patient presents to [facility] due to progressive shortness of breath, dark colored sputum, back pain and thumb pain. He started to have hallucinations with increasing altered mentation while in the ER.  Patient's main contact is BROTHERS, Kandy Garrison (Significant other) [phone] ([place]), patient's sister's are Samantha Crimes and Debi Caroll [phone], sisters live out of state.  CSW spoke with Ms. Lily Peer and Ms. Caroll, both of them agreed the primary decisions maker for the patient will be Ms. Mallie Darting. CSW spoke with Ms. Brother's who is the primary care giver for patient's mother, who suffered a stroke.  Ms. Frederick Peers stated the patient drinks three half-gallons of rum each week, "and had been for decades." Ms. Brother's stated the patient has been vaping for the last couple of years. Ms. Frederick Peers stated he patient was working regularly and was able to perform all ADLs.  CSW discussed possible discharge plans including LTACH, SNF and home health.  Ms. Frederick Peers stated she was told the patient would most likely need long term rehab and a trach. CSW stated because the patient did not have insurance placement would be very difficult.  CSW encouraged Ms. Brother's to begin Medicaid application for patient.  CSW gave Ms. Brothers Medicaid.gov and [facility] DSS contact information.  Expected Discharge Plan: Skilled Nursing Facility Barriers to Discharge: Inadequate or no insurance, Continued Medical Work up  Expected Discharge Plan and Services Expected Discharge Plan: Skilled Nursing Facility In-house Referral: Clinical Social Work   Post Acute Care Choice: Skilled Nursing Facility Living arrangements for the past 2  months: Single Family Home                                       Social Determinants of Health (SDOH) Interventions    Readmission Risk Interventions No flowsheet data found.

## 2020-11-28 DIAGNOSIS — E43 Unspecified severe protein-calorie malnutrition: Secondary | ICD-10-CM

## 2020-11-28 DIAGNOSIS — Z9911 Dependence on respirator [ventilator] status: Secondary | ICD-10-CM

## 2020-11-28 DIAGNOSIS — J9621 Acute and chronic respiratory failure with hypoxia: Secondary | ICD-10-CM

## 2020-11-28 DIAGNOSIS — J189 Pneumonia, unspecified organism: Secondary | ICD-10-CM

## 2020-11-28 DIAGNOSIS — Z978 Presence of other specified devices: Secondary | ICD-10-CM

## 2020-11-28 DIAGNOSIS — G9340 Encephalopathy, unspecified: Secondary | ICD-10-CM

## 2020-11-28 DIAGNOSIS — J9601 Acute respiratory failure with hypoxia: Secondary | ICD-10-CM

## 2020-11-28 DIAGNOSIS — A419 Sepsis, unspecified organism: Secondary | ICD-10-CM

## 2020-11-28 DIAGNOSIS — J9602 Acute respiratory failure with hypercapnia: Secondary | ICD-10-CM

## 2020-11-28 DIAGNOSIS — J439 Emphysema, unspecified: Secondary | ICD-10-CM

## 2020-11-28 DIAGNOSIS — S32010A Wedge compression fracture of first lumbar vertebra, initial encounter for closed fracture: Secondary | ICD-10-CM

## 2020-11-28 LAB — RESPIRATORY PANEL BY PCR

## 2020-11-28 LAB — BLOOD GAS, ARTERIAL
Acid-Base Excess: 2.6 mmol/L — ABNORMAL HIGH (ref 0.0–2.0)
Bicarbonate: 31.5 mmol/L — ABNORMAL HIGH (ref 20.0–28.0)
FIO2: 0.5
Mechanical Rate: 20
O2 Saturation: 94.6 %
PEEP: 5 cmH2O
Patient temperature: 38
Pressure control: 20 cmH2O
pCO2 arterial: 70 mmHg (ref 32.0–48.0)
pH, Arterial: 7.27 — ABNORMAL LOW (ref 7.350–7.450)
pO2, Arterial: 79 mmHg — ABNORMAL LOW (ref 83.0–108.0)

## 2020-11-28 LAB — BASIC METABOLIC PANEL
Anion gap: 7 (ref 5–15)
BUN: 17 mg/dL (ref 6–20)
CO2: 29 mmol/L (ref 22–32)
Calcium: 8.1 mg/dL — ABNORMAL LOW (ref 8.9–10.3)
Chloride: 108 mmol/L (ref 98–111)
Creatinine, Ser: 0.63 mg/dL (ref 0.61–1.24)
GFR, Estimated: >60 mL/min (ref >60)
Glucose, Bld: 97 mg/dL (ref 70–99)
Potassium: 4 mmol/L (ref 3.5–5.1)
Sodium: 144 mmol/L (ref 135–145)

## 2020-11-28 LAB — PROCALCITONIN: Procalcitonin: 0.72 ng/mL

## 2020-11-28 LAB — PHOSPHORUS: Phosphorus: 3.8 mg/dL (ref 2.5–4.6)

## 2020-11-28 LAB — CBC
HCT: 40.7 % (ref 39.0–52.0)
Hemoglobin: 13.5 g/dL (ref 13.0–17.0)
MCH: 35.5 pg — ABNORMAL HIGH (ref 26.0–34.0)
MCHC: 33.2 g/dL (ref 30.0–36.0)
MCV: 107.1 fL — ABNORMAL HIGH (ref 80.0–100.0)
Platelets: 315 10*3/uL (ref 150–400)
RBC: 3.8 MIL/uL — ABNORMAL LOW (ref 4.22–5.81)
RDW: 13.1 % (ref 11.5–15.5)
WBC: 9.4 10*3/uL (ref 4.0–10.5)
nRBC: 0 % (ref 0.0–0.2)

## 2020-11-28 LAB — MAGNESIUM: Magnesium: 2.3 mg/dL (ref 1.7–2.4)

## 2020-11-28 LAB — GLUCOSE, CAPILLARY
Glucose-Capillary: 101 mg/dL — ABNORMAL HIGH (ref 70–99)
Glucose-Capillary: 103 mg/dL — ABNORMAL HIGH (ref 70–99)
Glucose-Capillary: 106 mg/dL — ABNORMAL HIGH (ref 70–99)
Glucose-Capillary: 84 mg/dL (ref 70–99)
Glucose-Capillary: 85 mg/dL (ref 70–99)
Glucose-Capillary: 94 mg/dL (ref 70–99)
Glucose-Capillary: 99 mg/dL (ref 70–99)

## 2020-11-28 LAB — VDRL, CSF: VDRL Quant, CSF: NONREACTIVE

## 2020-11-28 LAB — HSV 1/2 PCR, CSF
HSV-1 DNA: NEGATIVE
HSV-2 DNA: NEGATIVE

## 2020-11-28 MED ORDER — MIDAZOLAM-SODIUM CHLORIDE 100-0.9 MG/100ML-% IV SOLN
0.0000 mg/h | INTRAVENOUS | Status: DC
  Administered 2020-11-28: 8 mg/h via INTRAVENOUS
  Administered 2020-11-28: 4 mg/h via INTRAVENOUS
  Administered 2020-11-28: 8 mg/h via INTRAVENOUS
  Administered 2020-11-29: 10 mg/h via INTRAVENOUS
  Administered 2020-11-29: 7 mg/h via INTRAVENOUS
  Administered 2020-11-30 (×2): 10 mg/h via INTRAVENOUS
  Administered 2020-12-01: 15 mg/h via INTRAVENOUS
  Administered 2020-12-01: 10 mg/h via INTRAVENOUS
  Administered 2020-12-01 (×2): 15 mg/h via INTRAVENOUS
  Administered 2020-12-01: 10 mg/h via INTRAVENOUS
  Administered 2020-12-02 – 2020-12-04 (×9): 15 mg/h via INTRAVENOUS
  Filled 2020-11-28 (×19): qty 100

## 2020-11-28 MED ORDER — SODIUM CHLORIDE 0.9% FLUSH: 10.0000 mL | INTRAVENOUS | Status: DC | PRN

## 2020-11-28 MED ORDER — SODIUM CHLORIDE 0.9% FLUSH
10.0000 mL | Freq: Two times a day (BID) | INTRAVENOUS | Status: DC
  Administered 2020-11-28 – 2020-12-06 (×15): 10 mL

## 2020-11-28 MED ORDER — OXYCODONE HCL 5 MG PO TABS
10.0000 mg | ORAL_TABLET | ORAL | Status: DC
  Administered 2020-11-28 – 2020-12-06 (×43): 10 mg via ORAL
  Filled 2020-11-28 (×43): qty 2

## 2020-11-28 MED ORDER — MIDAZOLAM BOLUS VIA INFUSION
0.0000 mg | INTRAVENOUS | Status: DC | PRN
  Administered 2020-11-29: 5 mg via INTRAVENOUS
  Administered 2020-11-29 (×3): 2 mg via INTRAVENOUS
  Administered 2020-12-01 (×5): 5 mg via INTRAVENOUS
  Administered 2020-12-01: 3 mg via INTRAVENOUS
  Administered 2020-12-01 – 2020-12-02 (×2): 5 mg via INTRAVENOUS
  Administered 2020-12-03 – 2020-12-04 (×2): 2 mg via INTRAVENOUS
  Filled 2020-11-28: qty 5

## 2020-11-28 MED ORDER — MIDAZOLAM HCL 2 MG/2ML IJ SOLN
2.0000 mg | INTRAMUSCULAR | Status: DC | PRN
  Administered 2020-11-28: 4 mg via INTRAVENOUS
  Administered 2020-11-28: 2 mg via INTRAVENOUS
  Filled 2020-11-28: qty 2
  Filled 2020-11-28: qty 4

## 2020-11-28 MED ORDER — BISACODYL 10 MG RE SUPP
10.0000 mg | Freq: Once | RECTAL | Status: AC
  Administered 2020-11-28: 10 mg via RECTAL
  Filled 2020-11-28: qty 1

## 2020-11-28 MED ORDER — QUETIAPINE FUMARATE 25 MG PO TABS
50.0000 mg | ORAL_TABLET | Freq: Every day | ORAL | Status: DC
  Administered 2020-11-28 – 2020-12-03 (×6): 50 mg
  Filled 2020-11-28 (×6): qty 2

## 2020-11-28 MED ORDER — VECURONIUM BROMIDE 10 MG IV SOLR
10.0000 mg | INTRAVENOUS | Status: DC | PRN
  Administered 2020-11-28: 10 mg via INTRAVENOUS
  Filled 2020-11-28: qty 10

## 2020-11-28 MED ORDER — CHLORDIAZEPOXIDE HCL 25 MG PO CAPS
25.0000 mg | ORAL_CAPSULE | Freq: Four times a day (QID) | ORAL | Status: DC
  Administered 2020-11-28 – 2020-12-04 (×24): 25 mg
  Filled 2020-11-28 (×24): qty 1

## 2020-11-28 MED ORDER — PROSOURCE TF PO LIQD
45.0000 mL | Freq: Three times a day (TID) | ORAL | Status: DC
  Administered 2020-11-28 – 2020-12-04 (×18): 45 mL
  Filled 2020-11-28: qty 45

## 2020-11-28 MED ORDER — QUETIAPINE FUMARATE 25 MG PO TABS: 25.0000 mg | ORAL_TABLET | Freq: Every day | ORAL | Status: DC

## 2020-11-28 MED ORDER — VITAL HIGH PROTEIN PO LIQD
1000.0000 mL | ORAL | Status: DC
  Administered 2020-11-28 – 2020-12-03 (×5): 1000 mL

## 2020-11-28 NOTE — Consult Note (Signed)
PHARMACY CONSULT NOTE  Pharmacy Consult for Electrolyte Monitoring and Replacement   Recent Labs: Potassium (mmol/L)  Date Value  11/28/2020 4.0   Magnesium (mg/dL)  Date Value  97/35/3299 2.3   Calcium (mg/dL)  Date Value  24/26/8341 8.1 (L)   Albumin (g/dL)  Date Value  96/22/2979 2.0 (L)   Phosphorus (mg/dL)  Date Value  89/21/1941 3.8   Sodium (mmol/L)  Date Value  11/28/2020 144   Assessment: Patient is a 55 y/o M with medical history including EtOH abuse who presented to the ED 9/19 with chief complaint of SOB, dark colored sputum, back pain and thumb pain. While in the ED, patient began experiencing hallucinations and encephalopathy. Patient ultimately required intubation 9/19. Patient is now admitted to the ICU where he remains intubated, sedated, and on mechanical ventilation. Pharmacy consulted to assist with electrolyte monitoring and replacement as indicated.  Nutrition: Tube feeds started 9/20  Goal of Therapy:  Electrolytes within normal limits  Plan:  --No electrolyte replacement warranted at this time --Continue to follow  Pricilla Riffle, PharmD, BCPS Clinical Pharmacist 11/28/2020 1:32 PM

## 2020-11-28 NOTE — Progress Notes (Signed)
Date of Admission:  11/21/2020    ID: Seth Harper is a 55 y.o. male Principal Problem:   Alcohol withdrawal with delirium (HCC) Active Problems:   On mechanically assisted ventilation (HCC)   Endotracheally intubated   Acute encephalopathy   Acute and chronic respiratory failure with hypoxia (HCC)   CAP (community acquired pneumonia)   Sepsis (HCC)   Emphysema of lung (HCC)   Severe malnutrition (HCC)   Compression fracture of L1 vertebra (HCC)    Subjective: Remains intubated  Medications:   chlordiazePOXIDE  25 mg Per Tube QID   chlorhexidine gluconate (MEDLINE KIT)  15 mL Mouth Rinse BID   Chlorhexidine Gluconate Cloth  6 each Topical Q0600   docusate  100 mg Per Tube BID   feeding supplement (PROSource TF)  45 mL Per Tube TID   folic acid  1 mg Per Tube Daily   free water  30 mL Per Tube Q4H   heparin  5,000 Units Subcutaneous Q8H   ipratropium-albuterol  3 mL Nebulization Q6H   mouth rinse  15 mL Mouth Rinse 10 times per day   metoCLOPramide (REGLAN) injection  5 mg Intravenous Q8H   multivitamin with minerals  1 tablet Per Tube Daily   oxyCODONE  10 mg Oral Q4H   pantoprazole (PROTONIX) IV  40 mg Intravenous QHS   polyethylene glycol  17 g Per Tube Daily   QUEtiapine  50 mg Per Tube QHS   thiamine injection  100 mg Intravenous Daily    Objective: Vital signs in last 24 hours: Patient Vitals for the past 24 hrs:  BP Temp Temp src Pulse Resp SpO2 Height Weight  11/28/20 2100 103/62 100 F (37.8 C) -- (!) 104 19 95 % -- --  11/28/20 2000 101/66 99.9 F (37.7 C) Esophageal (!) 107 18 91 % -- --  11/28/20 1947 -- -- -- -- -- 91 % 6\' 3"  (1.905 m) --  11/28/20 1900 105/69 99.9 F (37.7 C) -- (!) 116 (!) 22 (!) 86 % -- --  11/28/20 1830 108/64 100 F (37.8 C) -- (!) 109 20 95 % -- --  11/28/20 1730 104/63 99.9 F (37.7 C) -- (!) 106 14 95 % -- --  11/28/20 1700 108/67 100 F (37.8 C) -- (!) 109 11 99 % -- --  11/28/20 1630 115/74 100 F (37.8 C) -- (!) 117  15 98 % -- --  11/28/20 1600 107/67 (!) 100.6 F (38.1 C) -- (!) 109 18 98 % -- --  11/28/20 1542 -- -- -- -- -- 96 % -- --  11/28/20 1530 99/64 (!) 100.6 F (38.1 C) -- (!) 107 12 96 % -- --  11/28/20 1500 102/62 (!) 100.6 F (38.1 C) -- (!) 113 19 95 % -- --  11/28/20 1430 104/66 (!) 100.8 F (38.2 C) -- (!) 127 (!) 21 94 % -- --  11/28/20 1400 132/79 (!) 101.1 F (38.4 C) -- (!) 153 19 94 % -- --  11/28/20 1330 (!) 190/91 (!) 101.7 F (38.7 C) -- (!) 146 (!) 28 91 % -- --  11/28/20 1300 (!) 156/89 (!) 101.1 F (38.4 C) -- (!) 140 (!) 31 90 % -- --  11/28/20 1230 127/77 (!) 100.4 F (38 C) -- (!) 110 (!) 27 100 % -- --  11/28/20 1200 109/71 99.9 F (37.7 C) -- 94 16 97 % -- --  11/28/20 1130 109/67 99.3 F (37.4 C) -- 87 18 99 % -- --  11/28/20 1104 -- -- -- -- -- 97 % -- --  11/28/20 1100 132/80 99.7 F (37.6 C) -- 88 (!) 22 100 % -- --  11/28/20 0930 109/70 99.3 F (37.4 C) -- 86 20 96 % -- --  11/28/20 0900 117/71 99.3 F (37.4 C) -- 90 20 95 % -- --  11/28/20 0830 110/71 99.3 F (37.4 C) -- 88 19 96 % -- --  11/28/20 0805 -- -- -- 85 (!) 22 98 % -- --  11/28/20 0800 -- 99.1 F (37.3 C) -- 77 20 95 % -- --  11/28/20 0730 -- 99.1 F (37.3 C) -- 78 20 98 % -- --  11/28/20 0615 -- 99 F (37.2 C) -- 78 20 98 % -- --  11/28/20 0600 -- 99 F (37.2 C) -- 79 20 98 % -- --  11/28/20 0545 110/74 99 F (37.2 C) -- 79 20 98 % -- --  11/28/20 0530 -- 98.8 F (37.1 C) -- 79 20 97 % -- --  11/28/20 0515 -- 99 F (37.2 C) -- 80 20 98 % -- --  11/28/20 0500 113/70 99 F (37.2 C) -- 80 20 98 % -- 134.2 kg  11/28/20 0445 -- 99 F (37.2 C) -- 81 20 98 % -- --  11/28/20 0430 -- 99 F (37.2 C) -- 80 10 97 % -- --  11/28/20 0415 105/68 99 F (37.2 C) -- 81 (!) 0 97 % -- --  11/28/20 0400 -- 99.1 F (37.3 C) -- 82 20 96 % -- --  11/28/20 0345 -- 99.1 F (37.3 C) -- 83 (!) 21 96 % -- --  11/28/20 0330 120/74 99 F (37.2 C) -- 81 (!) 21 97 % -- --  11/28/20 0321 -- -- -- -- --  94 % -- --  11/28/20 0315 -- 99 F (37.2 C) -- 77 19 95 % -- --  11/28/20 0300 -- 99 F (37.2 C) -- 77 (!) 21 96 % -- --  11/28/20 0245 105/71 98.8 F (37.1 C) -- 77 18 96 % -- --  11/28/20 0230 -- 98.8 F (37.1 C) -- 77 20 96 % -- --  11/28/20 0215 -- 98.8 F (37.1 C) -- 78 (!) 21 97 % -- --  11/28/20 0200 106/75 98.6 F (37 C) -- 78 20 96 % -- --  11/28/20 0145 -- 98.6 F (37 C) -- 78 14 97 % -- --  11/28/20 0130 -- 98.6 F (37 C) -- 79 12 97 % -- --  11/28/20 0115 108/76 98.6 F (37 C) -- 80 15 95 % -- --  11/28/20 0100 -- 98.6 F (37 C) -- 81 16 95 % -- --  11/28/20 0045 -- 98.6 F (37 C) -- 80 18 97 % -- --  11/28/20 0030 108/73 98.6 F (37 C) -- 80 19 96 % -- --  11/28/20 0015 -- 98.4 F (36.9 C) -- 78 20 96 % -- --  11/28/20 0007 -- -- -- -- -- 96 % $Re'6\' 3"'sBi$  (1.905 m) --  11/28/20 0000 -- 98.4 F (36.9 C) -- 77 20 97 % -- --  11/27/20 2345 110/76 98.2 F (36.8 C) -- 77 (!) 0 95 % -- --  11/27/20 2330 -- 98.2 F (36.8 C) -- 77 (!) 0 96 % -- --  11/27/20 2315 -- 98.2 F (36.8 C) -- 78 (!) 0 96 % -- --  11/27/20 2300 115/77 98.4 F (36.9 C) -- 78 (!) 0 96 % -- --  11/27/20 2245 -- 98.4 F (36.9 C) -- 78 (!) 0 96 % -- --  11/27/20 2230 -- 98.4 F (36.9 C) -- 79 (!) 5 97 % -- --  11/27/20 2215 117/76 98.4 F (36.9 C) -- 80 (!) 0 96 % -- --    LDA Rt PICC Triple lumen 11/28/20 Foley catheter 11/22/20 PHYSICAL EXAM:  General:intubated , sedated NG tube Lungs: b/l air entry. Heart: irregular. Abdomen: Soft, mild distension Extremities: atraumatic, no cyanosis. No edema. No clubbing Skin: No rashes or lesions. Or bruising Lymph: Cervical, supraclavicular normal. Neurologic: cannot assess   Lab Results Recent Labs    11/27/20 0614 11/28/20 0459  WBC 10.2 9.4  HGB 13.5 13.5  HCT 40.4 40.7  NA 147* 144  K 4.1 4.0  CL 108 108  CO2 28 29  BUN 24* 17  CREATININE 0.67 0.63   Liver Panel Recent Labs    11/26/20 0437  PROT 6.0*  ALBUMIN 2.0*  AST 41   ALT 32  ALKPHOS 102  BILITOT 0.9  BILIDIR 0.4*  IBILI 0.5   Microbiology: 11/27/20 - Resp panel PCR Neg 11/25/20 CSF culture NG 9/22 resp culture NG 11/21/20- BC NG CSF WBC 18-27 Protein 181  Studies/Results: [place] EKG SITE RITE  Result Date: 11/27/2020 If Site Rite image not attached, placement could not be confirmed due to current cardiac rhythm.    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

## 2020-11-28 NOTE — Progress Notes (Signed)
Peripherally Inserted Central Catheter Placement  The IV Nurse has discussed with the patient and/or persons authorized to consent for the patient, the purpose of this procedure and the potential benefits and risks involved with this procedure.  The benefits include less needle sticks, lab draws from the catheter, and the patient may be discharged home with the catheter. Risks include, but not limited to, infection, bleeding, blood clot (thrombus formation), and puncture of an artery; nerve damage and irregular heartbeat and possibility to perform a PICC exchange if needed/ordered by physician.  Alternatives to this procedure were also discussed.  Bard Power PICC patient education guide, fact sheet on infection prevention and patient information card has been provided to patient /or left at bedside.  Telephone consent obtained from mother due to altered mental status.  PICC Placement Documentation  PICC Triple Lumen 11/28/20 PICC Right Basilic 44 cm 0 cm (Active)  Indication for Insertion or Continuance of Line Vasoactive infusions;Prolonged intravenous therapies 11/28/20 1756  Exposed Catheter (cm) 0 cm 11/28/20 1756  Site Assessment Clean;Dry;Intact 11/28/20 1756  Lumen #1 Status Flushed;Saline locked;Blood return noted 11/28/20 1756  Lumen #2 Status Flushed;Saline locked;Blood return noted 11/28/20 1756  Lumen #3 Status Flushed;Saline locked;Blood return noted 11/28/20 1756  Dressing Type Transparent 11/28/20 1756  Dressing Status Clean;Dry;Intact 11/28/20 1756  Antimicrobial disc in place? Yes 11/28/20 1756  Safety Lock Not Applicable 11/28/20 1756  Line Care Connections checked and tightened 11/28/20 1756  Line Adjustment (NICU/IV Team Only) No 11/28/20 1756  Dressing Intervention New dressing 11/28/20 1756  Dressing Change Due 12/05/20 11/28/20 1756       Seth Harper, Seth Harper 11/28/2020, 5:58 PM

## 2020-11-28 NOTE — Progress Notes (Signed)
   11/28/20 0910  Clinical Encounter Type  Visited With Family  Visit Type Initial  Referral From Chaplain  Consult/Referral To Chaplain   Chaplain met with PT's common law wife of 20 years, who was at bedside. PT's wife felt overwhelmed because of PT's health, and she is also the co-caregiver for her elderly mother in-law. She has a lot of internal questions of "why her". Chaplain normalized her feelings, and offered emotional/spiritual support. Chaplain ministered with reflective listening, compassionate presence, and prayer (as requested). Chaplain services is available if further needed.   Andee Poles, M. Div.

## 2020-11-28 NOTE — Progress Notes (Signed)
NAME:  Seth Harper, MRN:  [id number], DOB:  08-21-1965, LOS: 7 ADMISSION DATE:  11/21/2020, CONSULTATION DATE:  11/21/20 REFERRING MD:  Conni Slipper, MD, CHIEF COMPLAINT:  Altered Mentation   History of Present Illness:  Seth Harper is a 55 year old male with history of alcohol abuse/dependence who presented to the ER on 9/19 with progressive shortness of breath, dark colored sputum, back pain and thumb pain. He started to have hallucinations with increasing altered mentation while in the ER. Further history obtained by the ER nursing staff from the patient's significant other who reports the patient fell last Friday and lost consciousness. She says he has not slept in days, usually drinks 1.5 gallons of rum per week and his last drink was on Friday prior to the fall. Due to his increasing agitation and altered mentation he was sedated and intubated in the ER. PCCM has been called to admit the patient.   Pertinent  Medical History  EtOH abuse/dependence  Significant Hospital Events: Including procedures, antibiotic start and stop dates in addition to other pertinent events   9/19 Intubated, admitted to the ICU.  LP unsuccessful in the ED.  CT MRI head and EEG negative 9/20 changed to PCV due to vent dyssynchrony, lactic acid improved 9/22: Persistent fevers, recollect Tracheal aspirate, Doppler US BLE, consult ID.  KUB concerning for ileus vs SBO 9/23: Ileus resolved, to go for Fluro guided LP and MRI Brain w/ contrast, ABX changed to Meropenem, Acyclovir added  9/24: Uneventful night, started Precedex to wean off propofol and Versed 9/25: Agitated off midazolam, on Precedex, had to start some propofol.  Initiated through [facility]. 9/26 severe agitation, MRI BRAIN NEG, EEG WNL,   Interim History / Subjective:  Remains on vent Severe resp failure Severe ETOH withdrawal   Objective   Blood pressure 110/71, pulse 88, temperature 99.3 F (37.4 C), resp. rate 19, height _0  (1.905 m), weight  134.2 kg, SpO2 96 %.    Vent Mode: PCV FiO2 (%):  [40 %-45 %] 40 % Set Rate:  [20 bmp] 20 bmp PEEP:  [5 cmH20] 5 cmH20 Plateau Pressure:  [17 cmH20-22 cmH20] 20 cmH20   Intake/Output Summary (Last 24 hours) at 11/28/2020 0844 Last data filed at 11/28/2020 0841 Gross per 24 hour  Intake 5460.32 ml  Output 3550 ml  Net 1910.32 ml    Filed Weights   11/26/20 0415 11/27/20 0500 11/28/20 0500  Weight: 131.4 kg 131.3 kg 134.2 kg    Examination: BP 110/71   Pulse 88   Temp 99.3 F (37.4 C)   Resp 19   Ht _1  (1.905 m)   Wt 134.2 kg   SpO2 96%   BMI 36.98 kg/m    REVIEW OF SYSTEMS  PATIENT IS UNABLE TO PROVIDE COMPLETE REVIEW OF SYSTEMS DUE TO SEVERE CRITICAL ILLNESS AND TOXIC METABOLIC ENCEPHALOPATHY  PHYSICAL EXAMINATION:  GENERAL:critically ill appearing, +resp distress EYES: Pupils equal, round, reactive to light.  No scleral icterus.  MOUTH: Moist mucosal membrane. INTUBATED NECK: Supple.  PULMONARY: +rhonchi, +wheezing CARDIOVASCULAR: S1 and S2.  No murmurs  GASTROINTESTINAL: Soft, nontender, -distended. Positive bowel sounds.  MUSCULOSKELETAL: No swelling, clubbing, or edema.  NEUROLOGIC: obtunded SKIN:intact,warm,dry    Scheduled Meds:  chlordiazePOXIDE  25 mg Per Tube TID   chlorhexidine gluconate (MEDLINE KIT)  15 mL Mouth Rinse BID   Chlorhexidine Gluconate Cloth  6 each Topical Q0600   docusate  100 mg Per Tube BID   feeding supplement (PROSource TF)  90 mL Per Tube TID   folic acid  1 mg Per Tube Daily   free water  30 mL Per Tube Q4H   heparin  5,000 Units Subcutaneous Q8H   ipratropium-albuterol  3 mL Nebulization Q6H   mouth rinse  15 mL Mouth Rinse 10 times per day   metoCLOPramide (REGLAN) injection  5 mg Intravenous Q8H   multivitamin with minerals  1 tablet Per Tube Daily   pantoprazole (PROTONIX) IV  40 mg Intravenous QHS   polyethylene glycol  17 g Per Tube Daily   thiamine injection  100 mg Intravenous Daily   Continuous  Infusions:  sodium chloride 75 mL/hr at 11/28/20 0800   acyclovir 169 mL/hr at 11/28/20 0800   dexmedetomidine (PRECEDEX) IV infusion 1.2 mcg/kg/hr (11/28/20 0836)   feeding supplement (VITAL 1.5 CAL) 20 mL/hr at 11/27/20 1107   fentaNYL infusion INTRAVENOUS 200 mcg/hr (11/28/20 0841)   ibuprofen (CALDOLOR) IV Stopped (11/24/20 0718)   meropenem (MERREM) IV Stopped (11/28/20 0543)   propofol (DIPRIVAN) infusion 80 mcg/kg/min (11/28/20 0800)   PRN Meds:.acetaminophen, albuterol, fentaNYL, ibuprofen (CALDOLOR) IV, midazolam   Resolved Hospital Problem list   Ileus  Assessment & Plan:   55 yo obese white male with acute and severe resp failure on MV support with toxic metabolic encephalopathy from ETOH withdrawal  Severe ACUTE Hypoxic and Hypercapnic Respiratory Failure -continue Mechanical Ventilator support -continue Bronchodilator Therapy -Wean Fio2 and PEEP as tolerated -VAP/VENT bundle implementation -will not perform SAT/SBT when respiratory parameters are met  SEVERE ALCOHOL WITHDRAWAL -Therapy with Thiamine and MVI -CIWA Protocol   SEVERE COPD EXACERBATION -continue IV steroids as prescribed -continue NEB THERAPY as prescribed -morphine as needed -wean fio2 as needed and tolerated Acute Hypoxemic Respiratory Failure due to Community Acquired Pneumonia Emphysema   INFECTIOUS DISEASE -continue antibiotics as prescribed -follow up cultures -follow up ID consultation  SEVERE SEPSIS SOURCE-CAP -use vasopressors to keep MAP>65 as needed -follow ABG and LA as needed -follow up cultures -emperic ABX -consider stress dose steroids -aggressive IV fluid Resuscitation   GI GI PROPHYLAXIS as indicated  NUTRITIONAL STATUS DIET-->TF's as tolerated Constipation protocol as indicated Concern for Ileus vs. Small Bowel Obstruction>>resolved   L1 Compression Fracture s/p fall at home Conservative management, pain control  ENDO - ICU hypoglycemic\Hyperglycemia  protocol -check FSBS per protocol  Severe malnutrition due to chronic alcohol use Albumin 2.0, 9/24 Tube feeds per nutrition  Protein supplements  Best Practice (right click and "Reselect all SmartList Selections" daily)   Diet/type: NPO, tube feeds DVT prophylaxis: prophylactic heparin  GI prophylaxis: PPI Lines: N/A Foley:  yes, and is still needed Code Status:  full code ICU STATUS      DVT/GI PRX  assessed I Assessed the need for Labs I Assessed the need for Foley I Assessed the need for Central Venous Line Family Discussion when available I Assessed the need for Mobilization I made an Assessment of medications to be adjusted accordingly Safety Risk assessment completed  CASE DISCUSSED IN MULTIDISCIPLINARY ROUNDS WITH ICU TEAM     Critical Care Time devoted to patient care services described in this note is 55 minutes.  Critical care was necessary to treat /prevent imminent and life-threatening deterioration. Overall, patient is critically ill, prognosis is guarded.     Corrin Parker, M.D.  [facility]  Medical Director [place] Director [facility] Cardio-Pulmonary Department

## 2020-11-28 NOTE — Progress Notes (Signed)
GOALS OF CARE DISCUSSION  The Clinical status was relayed to family in detail. Significant Other Revonda Standard Updated and notified of patients medical condition.    Patient remains unresponsive and will not open eyes to command.   Patient is having a weak cough and struggling to remove secretions.   Patient with increased WOB and using accessory muscles to breathe Explained to family course of therapy and the modalities  Continue Vent support Severe ETOH withdrawal Failure to wean from vent ASPIRATION PNEUMONIA I have explained High risk for cardiac arrest   Patient with Progressive multiorgan failure with a very high probablity of a very minimal chance of meaningful recovery despite all aggressive and optimal medical therapy.  PATIENT REMAINS FULL CODE  Family understands the situation.   Family are satisfied with Plan of action and management. All questions answered  Additional CC time 25 mins   Zyden Suman Santiago Glad, M.D.  [facility]  Medical Director [facility] Medical Director [facility] Cardio-Pulmonary Department

## 2020-11-28 NOTE — Progress Notes (Signed)
Nutrition Follow-up  DOCUMENTATION CODES:  Not applicable  INTERVENTION:  Adjust tube feeding via OG tube to the following. Start at 61mL/h and advance by 51mL q8h to goal to allow time for BM to occur: Vital High Protein at 70 ml/h (1680 ml per day) with Prosource TF 45 ml TID (11g of protein and 40kcal/packet) Free water flush 54mL q4h to maintain tube patency  Provides 1800 kcal, 180 gm protein, 1585 ml free water daily (TF+flush) Continue vitamin regimen for hx of EtOH abuse  NUTRITION DIAGNOSIS:  Inadequate oral intake related to chronic illness (EtOH abuse) as evidenced by per patient/family report.  GOAL:  Patient will meet greater than or equal to 90% of their needs  MONITOR:  TF tolerance, Vent status, Labs, Weight trends, I & O's, Diet advancement  REASON FOR ASSESSMENT:  Ventilator, Consult Enteral/tube feeding initiation and management  ASSESSMENT:  55 yo male with a PMH of alcohol abuse/dependence (1.5 gallons rum per week) initially presented to ED with complaints of SOB and coughing up black phlegm. Pt also reported he had a fall on 9/16 and has had back pain since that time. Admitted 9/19.  While in ED, pt became increasingly agitated and required sedation and subsequent intubation. Family reports that pt had been reducing EtOH consumption. Episode was determined to be the result of withdrawal.   Pt noted to have L1 compression fracture after fall - no surgery planned.  9/19 - intubated; NGT placed 9/20 - TF initiated 9/23 - LP due to persistent fevers and encephalopathy (no significant findings)  Pt remains intubated and sedated at this time. TF currently infusing at 56mL/h, rate was decreased due to high residuals. One instance of noted yesterday (9/25) morning. RN notes that abdomen is distended and tight. Reglan being administered. Noted that no BM has occurred this admission. Significant other also reported no BM since fall PTA on 9/16. Suppository to be  added to bowel regimen per MD. Discussed in rounds, plans to change sedation regimen. Propofol currently providing a significant source of kcal. Md also ok with advancing TF rate back to goal.  Will adjust needs as pt has now had measured weights obtained. Will use 9/23 baseline wt to determine needs (130.9 kg).   MV: 13.2 L/min Temp (24hrs), Avg:98.6 F (37 C), Min:97.7 F (36.5 C), Max:99.3 F (37.4 C)  Propofol: 53.6 ml/hr (provides 1415 kcal/d)   Intake/Output Summary (Last 24 hours) at 11/28/2020 1215 Last data filed at 11/28/2020 0901 Gross per 24 hour  Intake 4786.72 ml  Output 2400 ml  Net 2386.72 ml   Net IO Since Admission: 9,437.47 mL [11/28/20 1215]  Nutritionally Relevant Medications: Scheduled Meds:  docusate  100 mg Per Tube BID   feeding supplement (PROSource TF)  90 mL Per Tube TID   folic acid  1 mg Per Tube Daily   free water  30 mL Per Tube Q4H   metoCLOPramide (REGLAN) injection  5 mg Intravenous Q8H   multivitamin with minerals  1 tablet Per Tube Daily   pantoprazole (PROTONIX) IV  40 mg Intravenous QHS   polyethylene glycol  17 g Per Tube Daily   thiamine injection  100 mg Intravenous Daily   Continuous Infusions:  sodium chloride 75 mL/hr at 11/28/20 0800   acyclovir 169 mL/hr at 11/28/20 0800   dexmedetomidine (PRECEDEX) IV infusion 1.2 mcg/kg/hr (11/28/20 0836)   feeding supplement (VITAL 1.5 CAL) 20 mL/hr at 11/27/20 1107   fentaNYL infusion INTRAVENOUS 200 mcg/hr (11/28/20 0901)  meropenem (MERREM) IV Stopped (11/28/20 0543)   propofol (DIPRIVAN) infusion 80 mcg/kg/min (11/28/20 0902)   Labs Reviewed  NUTRITION - FOCUSED PHYSICAL EXAM: Flowsheet Row Most Recent Value  Orbital Region Mild depletion  Upper Arm Region No depletion  Thoracic and Lumbar Region No depletion  Buccal Region Unable to assess  Temple Region Mild depletion  Clavicle Bone Region No depletion  Clavicle and Acromion Bone Region No depletion  Scapular Bone Region No  depletion  Dorsal Hand No depletion  Patellar Region No depletion  Anterior Thigh Region Mild depletion  Posterior Calf Region No depletion  Edema (RD Assessment) Mild  [R ankle]  Hair Reviewed  Eyes Unable to assess  Mouth Unable to assess  Skin Reviewed  Nails Reviewed   Diet Order:   Diet Order             Diet NPO time specified  Diet effective now                  EDUCATION NEEDS:  Not appropriate for education at this time  Skin:  Skin Assessment: Reviewed RN Assessment (Ecchymosis)  Last BM:  PTA/unknown  Height:  Ht Readings from Last 1 Encounters:  11/28/20 6\' 3"  (1.905 m)   Weight:  Wt Readings from Last 1 Encounters:  11/28/20 134.2 kg   BMI:  Body mass index is 36.98 kg/m.  Estimated Nutritional Needs:  Kcal:  1440-1833 kcal/d (11-14 kcal/kg (9/23 wt)) Protein:  178g/d (2g/kg IBW) Fluid:  >2 L  10-28-2003, RD, LDN Clinical Dietitian Pager on Amion

## 2020-11-29 DIAGNOSIS — R5081 Fever presenting with conditions classified elsewhere: Secondary | ICD-10-CM

## 2020-11-29 LAB — BASIC METABOLIC PANEL
Anion gap: 7 (ref 5–15)
BUN: 19 mg/dL (ref 6–20)
CO2: 28 mmol/L (ref 22–32)
Calcium: 7.9 mg/dL — ABNORMAL LOW (ref 8.9–10.3)
Chloride: 106 mmol/L (ref 98–111)
Creatinine, Ser: 0.81 mg/dL (ref 0.61–1.24)
GFR, Estimated: >60 mL/min (ref >60)
Glucose, Bld: 115 mg/dL — ABNORMAL HIGH (ref 70–99)
Potassium: 4.1 mmol/L (ref 3.5–5.1)
Sodium: 141 mmol/L (ref 135–145)

## 2020-11-29 LAB — TRIGLYCERIDES: Triglycerides: 605 mg/dL — ABNORMAL HIGH (ref <150)

## 2020-11-29 LAB — CSF CULTURE W GRAM STAIN: Culture: NO GROWTH

## 2020-11-29 LAB — PROCALCITONIN: Procalcitonin: 0.92 ng/mL

## 2020-11-29 LAB — GLUCOSE, CAPILLARY
Glucose-Capillary: 112 mg/dL — ABNORMAL HIGH (ref 70–99)
Glucose-Capillary: 86 mg/dL (ref 70–99)
Glucose-Capillary: 86 mg/dL (ref 70–99)
Glucose-Capillary: 116 mg/dL — ABNORMAL HIGH (ref 70–99)
Glucose-Capillary: 120 mg/dL — ABNORMAL HIGH (ref 70–99)

## 2020-11-29 LAB — MAGNESIUM: Magnesium: 2.5 mg/dL — ABNORMAL HIGH (ref 1.7–2.4)

## 2020-11-29 MED ORDER — LORAZEPAM 2 MG/ML IJ SOLN: 4.0000 mg | Freq: Once | INTRAMUSCULAR | Status: AC

## 2020-11-29 MED ORDER — LORAZEPAM 2 MG/ML IJ SOLN
INTRAMUSCULAR | Status: AC
  Administered 2020-11-29: 4 mg via INTRAVENOUS
  Filled 2020-11-29: qty 2

## 2020-11-29 MED ORDER — ARTIFICIAL TEARS OPHTHALMIC OINT
TOPICAL_OINTMENT | OPHTHALMIC | Status: DC | PRN
  Administered 2020-11-30: 1 via OPHTHALMIC
  Filled 2020-11-29 (×2): qty 3.5

## 2020-11-29 MED ORDER — METHYLPREDNISOLONE SODIUM SUCC 40 MG IJ SOLR
40.0000 mg | INTRAMUSCULAR | Status: DC
  Administered 2020-11-29 – 2020-12-04 (×6): 40 mg via INTRAVENOUS
  Filled 2020-11-29 (×6): qty 1

## 2020-11-29 MED ORDER — AMPICILLIN-SULBACTAM SODIUM 3 (2-1) G IJ SOLR
3.0000 g | Freq: Four times a day (QID) | INTRAMUSCULAR | Status: AC
Start: 2020-11-29 — End: 2020-12-04
  Administered 2020-11-29 – 2020-12-04 (×21): 3 g via INTRAVENOUS
  Filled 2020-11-29: qty 8
  Filled 2020-11-29 (×4): qty 3
  Filled 2020-11-29 (×2): qty 8
  Filled 2020-11-29 (×6): qty 3
  Filled 2020-11-29: qty 8
  Filled 2020-11-29 (×7): qty 3

## 2020-11-29 NOTE — Plan of Care (Signed)

## 2020-11-29 NOTE — Progress Notes (Signed)
Met wife of patient in hallway, she spoke of her husband and the unknown and how difficult it is waiting for him to recover. She shared her faith and requested that I come to pray, she mentioned the chaplain had been visiting but stated there can never be too much prayer. Sister of patient was bedside, I provided presence and support along with a requested prayer.

## 2020-11-29 NOTE — Progress Notes (Signed)
Date of Admission:  11/21/2020    ID: Seth Harper is a 55 y.o. male Principal Problem:   Alcohol withdrawal with delirium ([place]) Active Problems:   On mechanically assisted ventilation (HCC)   Endotracheally intubated   Acute encephalopathy   Acute and chronic respiratory failure with hypoxia (HCC)   CAP (community acquired pneumonia)   Sepsis ([place])   Emphysema of lung ([place])   Severe malnutrition ([place])   Compression fracture of L1 vertebra (HCC)    Subjective: Remains intubated  Medications:   chlordiazePOXIDE  25 mg Per Tube QID   chlorhexidine gluconate (MEDLINE KIT)  15 mL Mouth Rinse BID   Chlorhexidine Gluconate Cloth  6 each Topical Q0600   docusate  100 mg Per Tube BID   feeding supplement (PROSource TF)  45 mL Per Tube TID   folic acid  1 mg Per Tube Daily   free water  30 mL Per Tube Q4H   heparin  5,000 Units Subcutaneous Q8H   ipratropium-albuterol  3 mL Nebulization Q6H   mouth rinse  15 mL Mouth Rinse 10 times per day   methylPREDNISolone (SOLU-MEDROL) injection  40 mg Intravenous Q24H   multivitamin with minerals  1 tablet Per Tube Daily   oxyCODONE  10 mg Oral Q4H   pantoprazole (PROTONIX) IV  40 mg Intravenous QHS   polyethylene glycol  17 g Per Tube Daily   QUEtiapine  50 mg Per Tube QHS   sodium chloride flush  10-40 mL Intracatheter Q12H   thiamine injection  100 mg Intravenous Daily    Objective: Vital signs in last 24 hours: Patient Vitals for the past 24 hrs:  BP Temp Temp src Pulse Resp SpO2 Height Weight  11/29/20 2330 108/71 98.2 F (36.8 C) -- 92 19 92 % -- --  11/29/20 2300 110/68 98.2 F (36.8 C) -- 94 20 92 % -- --  11/29/20 2245 -- -- -- -- 20 -- -- --  11/29/20 2230 109/68 98.4 F (36.9 C) -- 94 20 93 % -- --  11/29/20 2200 114/71 98.6 F (37 C) -- 97 20 95 % -- --  11/29/20 2130 115/71 98.6 F (37 C) -- 98 20 97 % -- --  11/29/20 2100 125/73 98.4 F (36.9 C) -- (!) 103 20 97 % -- --  11/29/20 2030 121/79 98.8 F (37.1 C) --  89 18 98 % -- --  11/29/20 2000 120/79 98.6 F (37 C) -- 89 19 98 % -- --  11/29/20 1930 116/75 98.6 F (37 C) -- 92 15 97 % -- --  11/29/20 1900 111/75 98.8 F (37.1 C) -- 91 20 98 % -- --  11/29/20 1800 122/77 98.8 F (37.1 C) -- (!) 101 11 99 % -- --  11/29/20 1700 134/85 98.6 F (37 C) -- (!) 110 19 100 % -- --  11/29/20 1600 125/76 98.6 F (37 C) Esophageal (!) 108 (!) 21 98 % -- --  11/29/20 1503 -- -- -- (!) 111 (!) 21 96 % -- --  11/29/20 1500 (!) 132/99 99.1 F (37.3 C) -- (!) 114 18 97 % -- --  11/29/20 1400 120/72 100 F (37.8 C) -- (!) 104 (!) 21 96 % -- --  11/29/20 1300 120/66 (!) 101.3 F (38.5 C) -- (!) 108 (!) 21 96 % -- --  11/29/20 1200 131/76 (!) 102 F (38.9 C) Esophageal (!) 114 18 96 % -- --  11/29/20 1124 -- -- -- -- -- 93 % -- --  11/29/20 1100 112/69 (!) 101.3 F (38.5 C) -- (!) 105 18 93 % -- --  11/29/20 1000 116/69 (!) 101.1 F (38.4 C) -- (!) 109 (!) 21 90 % -- --  11/29/20 0900 112/60 (!) 100.9 F (38.3 C) -- (!) 115 20 99 % -- --  11/29/20 0800 120/77 (!) 100.6 F (38.1 C) -- (!) 112 (!) 23 97 % -- --  11/29/20 0700 106/65 100.2 F (37.9 C) -- (!) 102 18 97 % -- --  11/29/20 0630 110/68 100 F (37.8 C) -- (!) 101 (!) 22 96 % -- --  11/29/20 0600 110/67 99.7 F (37.6 C) -- (!) 105 18 96 % -- --  11/29/20 0530 111/70 99.5 F (37.5 C) -- 96 19 96 % -- --  11/29/20 0500 100/62 99.5 F (37.5 C) -- 98 (!) 21 95 % -- --  11/29/20 0430 (!) 100/59 99.7 F (37.6 C) -- 98 20 96 % -- --  11/29/20 0428 -- -- -- -- -- -- -- (!) 137.5 kg  11/29/20 0400 106/62 99.5 F (37.5 C) -- (!) 102 19 96 % -- --  11/29/20 0330 109/66 99.5 F (37.5 C) -- 100 20 96 % -- --  11/29/20 0304 -- -- -- -- -- 98 % _0  (1.905 m) --  11/29/20 0300 (!) 101/59 99.3 F (37.4 C) -- 99 20 97 % -- --  11/29/20 0200 99/60 99.3 F (37.4 C) -- 100 20 97 % -- --  11/29/20 0130 102/61 99.3 F (37.4 C) -- (!) 103 17 96 % -- --  11/29/20 0100 (!) 98/58 99.3 F (37.4 C) -- (!)  103 20 96 % -- --  11/29/20 0030 95/60 99.5 F (37.5 C) -- (!) 105 20 95 % -- --  11/29/20 0000 (!) 98/58 99.5 F (37.5 C) -- (!) 109 20 95 % -- --    LDA Rt PICC Triple lumen 11/28/20 Foley catheter 11/22/20 PHYSICAL EXAM:  General:intubated , sedated NG tube Lungs: b/l air entry. Heart: irregular. Abdomen: Soft, mild distension Extremities: atraumatic, no cyanosis. No edema. No clubbing Skin: No rashes or lesions. Or bruising Lymph: Cervical, supraclavicular normal. Neurologic: cannot assess   Lab Results Recent Labs    11/27/20 0614 11/28/20 0459 11/29/20 0342  WBC 10.2 9.4  --   HGB 13.5 13.5  --   HCT 40.4 40.7  --   NA 147* 144 141  K 4.1 4.0 4.1  CL 108 108 106  CO2 _1 BUN 24* 17 19  CREATININE 0.67 0.63 0.81   Liver Panel No results for input(s): PROT, ALBUMIN, AST, ALT, ALKPHOS, BILITOT, BILIDIR, IBILI in the last 72 hours.  Microbiology: 11/27/20 - Resp panel PCR Neg 11/25/20 CSF culture NG 9/22 resp culture NG 11/21/20- BC NG CSF WBC 18-27 Protein 181 9/26 BC     Assessment/Plan: Acute encephalopathy likely due to alcohol withdrawal  Acute hypoxic resp failure- intubated  Fever after 3 days- on meropenem which is adequately treating aspiration pneumonia- so this fever is likely  central fever from DTs- blood, resp, csf cultures have all been neg Will de-escalate meropenem to unasyn As herpes PCR neg in csf- discontinue  acyclovir f Discussed the management with the care team

## 2020-11-29 NOTE — TOC Progression Note (Addendum)
Transition of Care [facility]) - Progression Note    Patient Details  Name: Seth Harper MRN: [id number] Date of Birth: [birth year]  Transition of Care [facility]) CM/SW [place], [place] Phone Number: [phone] 11/29/2020, 6:23 PM  Clinical Narrative:     Patient remains intubated and sedated, experiencing severe hypoxia. Low grade fever yesterday 11/28/2020, tube feeds restarted. Patient vent at 100% on 11/28/2020, thick secretions. Family at bedside today, met w/ Chaplain. Main contact BROTHERS, ALLISON E (Significant other) [phone].  Patient remains FULL CODE. TOC will continue to follow.  Expected Discharge Plan: [place] Barriers to Discharge: Inadequate or no insurance, Continued Medical Work up  Expected Discharge Plan and Services Expected Discharge Plan: [place] In-house Referral: Clinical Social Work   Post Acute Care Choice: [place] Living arrangements for the past 2 months: Single Family Home                                       Social Determinants of Health (SDOH) Interventions    Readmission Risk Interventions No flowsheet data found.

## 2020-11-29 NOTE — Progress Notes (Signed)
NAME:  Seth Harper, MRN:  [id number], DOB:  [birth year], LOS: 8 ADMISSION DATE:  11/21/2020, CONSULTATION DATE:  11/21/20 REFERRING MD:  Conni Slipper, MD, CHIEF COMPLAINT:  Altered Mentation   History of Present Illness:  Seth Harper is a 55 year old male with history of alcohol abuse/dependence who presented to the ER on 9/19 with progressive shortness of breath, dark colored sputum, back pain and thumb pain. He started to have hallucinations with increasing altered mentation while in the ER. Further history obtained by the ER nursing staff from the patient's significant other who reports the patient fell last Friday and lost consciousness. She says he has not slept in days, usually drinks 1.5 gallons of rum per week and his last drink was on Friday prior to the fall. Due to his increasing agitation and altered mentation he was sedated and intubated in the ER. PCCM has been called to admit the patient.   Pertinent  Medical History  EtOH abuse/dependence  Significant Hospital Events: Including procedures, antibiotic start and stop dates in addition to other pertinent events   9/19 Intubated, admitted to the ICU.  LP unsuccessful in the ED.  CT MRI head and EEG negative 9/20 changed to PCV due to vent dyssynchrony, lactic acid improved 9/22: Persistent fevers, recollect Tracheal aspirate, Doppler US BLE, consult ID.  KUB concerning for ileus vs SBO 9/23: Ileus resolved, to go for Fluro guided LP and MRI Brain w/ contrast, ABX changed to Meropenem, Acyclovir added  9/24: Uneventful night, started Precedex to wean off propofol and Versed 9/25: Agitated off midazolam, on Precedex, had to start some propofol.  Initiated through [facility]. 9/26 severe agitation, MRI BRAIN NEG, EEG WNL,  9/27 severe hypoxia, remains on vent   Interim History / Subjective:  Remains on vent Severe hypoxia Severe agitation and encephalopathy    Objective   Blood pressure 106/65, pulse (!) 112, temperature 100.2 F  (37.9 C), resp. rate (!) 23, height $RemoveBe'6\' 3"'nBHvhjDaM$  (1.905 m), weight (!) 137.5 kg, SpO2 97 %.    Vent Mode: PCV FiO2 (%):  [40 %-60 %] 50 % Set Rate:  [20 bmp] 20 bmp PEEP:  [5 cmH20] 5 cmH20   Intake/Output Summary (Last 24 hours) at 11/29/2020 0900 Last data filed at 11/29/2020 1007 Gross per 24 hour  Intake 4335.63 ml  Output 1995 ml  Net 2340.63 ml    Filed Weights   11/27/20 0500 11/28/20 0500 11/29/20 0428  Weight: 131.3 kg 134.2 kg (!) 137.5 kg    Examination: BP 106/65   Pulse (!) 112   Temp 100.2 F (37.9 C)   Resp (!) 23   Ht $R'6\' 3"'BT$  (1.905 m)   Wt (!) 137.5 kg   SpO2 97%   BMI 37.89 kg/m    REVIEW OF SYSTEMS  PATIENT IS UNABLE TO PROVIDE COMPLETE REVIEW OF SYSTEMS DUE TO SEVERE CRITICAL ILLNESS AND TOXIC METABOLIC ENCEPHALOPATHY  PHYSICAL EXAMINATION:  GENERAL:critically ill appearing, +resp distress EYES: Pupils equal, round, reactive to light.  No scleral icterus.  MOUTH: Moist mucosal membrane. INTUBATED NECK: Supple.  PULMONARY: +rhonchi, +wheezing CARDIOVASCULAR: S1 and S2.  No murmurs  GASTROINTESTINAL: Soft, nontender, -distended. Positive bowel sounds.  MUSCULOSKELETAL: No swelling, clubbing, or edema.  NEUROLOGIC: obtunded SKIN:intact,warm,dry      Scheduled Meds:  chlordiazePOXIDE  25 mg Per Tube QID   chlorhexidine gluconate (MEDLINE KIT)  15 mL Mouth Rinse BID   Chlorhexidine Gluconate Cloth  6 each Topical Q0600   docusate  100 mg Per  Tube BID   feeding supplement (PROSource TF)  45 mL Per Tube TID   folic acid  1 mg Per Tube Daily   free water  30 mL Per Tube Q4H   heparin  5,000 Units Subcutaneous Q8H   ipratropium-albuterol  3 mL Nebulization Q6H   mouth rinse  15 mL Mouth Rinse 10 times per day   metoCLOPramide (REGLAN) injection  5 mg Intravenous Q8H   multivitamin with minerals  1 tablet Per Tube Daily   oxyCODONE  10 mg Oral Q4H   pantoprazole (PROTONIX) IV  40 mg Intravenous QHS   polyethylene glycol  17 g Per Tube Daily    QUEtiapine  50 mg Per Tube QHS   sodium chloride flush  10-40 mL Intracatheter Q12H   thiamine injection  100 mg Intravenous Daily   Continuous Infusions:  sodium chloride Stopped (11/28/20 1813)   acyclovir 955 mg (11/29/20 0830)   feeding supplement (VITAL HIGH PROTEIN) 40 mL/hr at 11/29/20 0545   fentaNYL infusion INTRAVENOUS 125 mcg/hr (11/29/20 0604)   ibuprofen (CALDOLOR) IV Stopped (11/24/20 0718)   meropenem (MERREM) IV 2 g (11/29/20 0527)   midazolam 7 mg/hr (11/29/20 0728)   propofol (DIPRIVAN) infusion 60 mcg/kg/min (11/29/20 0728)   PRN Meds:.acetaminophen, albuterol, fentaNYL, ibuprofen (CALDOLOR) IV, midazolam, sodium chloride flush, vecuronium   Resolved Hospital Problem list   Ileus  Assessment & Plan:   55 yo obese white male with acute and severe resp failure on MV support with toxic metabolic encephalopathy from ETOH withdrawal with ileus   Severe ACUTE Hypoxic and Hypercapnic Respiratory Failure -continue Mechanical Ventilator support -continue Bronchodilator Therapy -Wean Fio2 and PEEP as tolerated -VAP/VENT bundle implementation -will NOT perform SAT/SBT when respiratory parameters are met Vent Mode: PCV FiO2 (%):  [40 %-60 %] 50 % Set Rate:  [20 bmp] 20 bmp PEEP:  [5 cmH20] 5 cmH20   SEVERE ALCOHOL WITHDRAWAL -Therapy with Thiamine and MVI  ACUTE SYSTOLIC CARDIAC FAILURE- need to check ECHO -oxygen as needed -Lasix as tolerated -follow up cardiac enzymes as indicated -follow up cardiology recs   SEVERE COPD EXACERBATION -continue IV steroids as prescribed -continue NEB THERAPY as prescribed -morphine as needed -wean fio2 as needed and tolerated Acute Hypoxemic Respiratory Failure due to Community Acquired Pneumonia Emphysema Consider BRonchoscopy  INFECTIOUS DISEASE -continue antibiotics as prescribed -follow up cultures -follow up ID consultation  SEVERE SEPSIS  SOURCE-CAP -use vasopressors to keep MAP>65 as needed -follow ABG  and LA as needed -follow up cultures    GI GI PROPHYLAXIS as indicated  NUTRITIONAL STATUS DIET-->TF's as tolerated Constipation protocol as indicated Concern for Ileus vs. Small Bowel Obstruction>>resolved   L1 Compression Fracture s/p fall at home Conservative management, pain control  ENDO - ICU hypoglycemic\Hyperglycemia protocol -check FSBS per protocol  ELECTROLYTES -follow labs as needed -replace as needed -pharmacy consultation and following   Best Practice (right click and "Reselect all SmartList Selections" daily)   Diet/type: NPO, tube feeds DVT prophylaxis: prophylactic heparin  GI prophylaxis: PPI Lines: N/A Foley:  yes, and is still needed Code Status:  full code ICU STATUS      DVT/GI PRX  assessed I Assessed the need for Labs I Assessed the need for Foley I Assessed the need for Central Venous Line Family Discussion when available I Assessed the need for Mobilization I made an Assessment of medications to be adjusted accordingly Safety Risk assessment completed  CASE DISCUSSED IN [place]  Time devoted to patient care services described in this note is 55 minutes.  Critical care was necessary to treat /prevent imminent and life-threatening deterioration. Overall, patient is critically ill, prognosis is guarded.  Patient with Multiorgan failure and at high risk for cardiac arrest and death.   Patient with severe hypoxia with severe agitation day in 8 on vent, I anticipate prolonged ICU LOS and may need [facility]   Corrin Parker, M.D.  [facility]  Medical Director [place] Director [facility] Cardio-Pulmonary Department

## 2020-11-29 NOTE — Consult Note (Signed)
PHARMACY CONSULT NOTE  Pharmacy Consult for Electrolyte Monitoring and Replacement   Recent Labs: Potassium (mmol/L)  Date Value  11/29/2020 4.1   Magnesium (mg/dL)  Date Value  44/62/8638 2.5 (H)   Calcium (mg/dL)  Date Value  17/71/1657 7.9 (L)   Albumin (g/dL)  Date Value  90/38/3338 2.0 (L)   Phosphorus (mg/dL)  Date Value  32/91/9166 3.8   Sodium (mmol/L)  Date Value  11/29/2020 141   Assessment: Patient is a 55 y/o M with medical history including EtOH abuse who presented to the ED 9/19 with chief complaint of SOB, dark colored sputum, back pain and thumb pain. While in the ED, patient began experiencing hallucinations and encephalopathy. Patient ultimately required intubation 9/19. Patient is now admitted to the ICU where he remains intubated, sedated, and on mechanical ventilation. Pharmacy consulted to assist with electrolyte monitoring and replacement as indicated.  Nutrition: Tube feeds started 9/20  Goal of Therapy:  Electrolytes within normal limits  Plan:  --No electrolyte replacement warranted at this time --Continue to follow  Pricilla Riffle, PharmD, BCPS Clinical Pharmacist 11/29/2020 11:59 AM

## 2020-11-30 ENCOUNTER — Inpatient Hospital Stay (HOSPITAL_COMMUNITY)
Admit: 2020-11-30 | Discharge: 2020-11-30 | Disposition: A | Discharge status: Home / Self Care | Payer: Medicaid Other | Attending: Internal Medicine | Admitting: Internal Medicine

## 2020-11-30 ENCOUNTER — Inpatient Hospital Stay: Payer: Medicaid Other

## 2020-11-30 DIAGNOSIS — R0603 Acute respiratory distress: Secondary | ICD-10-CM

## 2020-11-30 LAB — LACTATE DEHYDROGENASE, PLEURAL OR PERITONEAL FLUID: LD, Fluid: 228 U/L — ABNORMAL HIGH (ref 3–23)

## 2020-11-30 LAB — BODY FLUID CELL COUNT WITH DIFFERENTIAL
Eos, Fluid: 0 %
Lymphs, Fluid: 16 %
Monocyte-Macrophage-Serous Fluid: 17 %
Neutrophil Count, Fluid: 67 %
Total Nucleated Cell Count, Fluid: 4561 cu mm

## 2020-11-30 LAB — ECHOCARDIOGRAM COMPLETE
AR max vel: 2.48 cm2
AV Area VTI: 2.72 cm2
AV Area mean vel: 2.69 cm2
AV Mean grad: 4 mmHg
AV Peak grad: 8.1 mmHg
Ao pk vel: 1.42 m/s
Area-P 1/2: 3.91 cm2
Height: 75 in
S' Lateral: 3 cm
Weight: 4786.63 oz

## 2020-11-30 LAB — CBC WITH DIFFERENTIAL/PLATELET
Abs Immature Granulocytes: 0.15 10*3/uL — ABNORMAL HIGH (ref 0.00–0.07)
Basophils Absolute: 0 10*3/uL (ref 0.0–0.1)
Basophils Relative: 0 %
Eosinophils Absolute: 0 10*3/uL (ref 0.0–0.5)
Eosinophils Relative: 0 %
HCT: 34.6 % — ABNORMAL LOW (ref 39.0–52.0)
Hemoglobin: 11.7 g/dL — ABNORMAL LOW (ref 13.0–17.0)
Immature Granulocytes: 1 %
Lymphocytes Relative: 18 %
Lymphs Abs: 2.1 10*3/uL (ref 0.7–4.0)
MCH: 35.7 pg — ABNORMAL HIGH (ref 26.0–34.0)
MCHC: 33.8 g/dL (ref 30.0–36.0)
MCV: 105.5 fL — ABNORMAL HIGH (ref 80.0–100.0)
Monocytes Absolute: 1.2 10*3/uL — ABNORMAL HIGH (ref 0.1–1.0)
Monocytes Relative: 10 %
Neutro Abs: 8.1 10*3/uL — ABNORMAL HIGH (ref 1.7–7.7)
Neutrophils Relative %: 71 %
Platelets: 367 10*3/uL (ref 150–400)
RBC: 3.28 MIL/uL — ABNORMAL LOW (ref 4.22–5.81)
RDW: 12.5 % (ref 11.5–15.5)
WBC: 11.5 10*3/uL — ABNORMAL HIGH (ref 4.0–10.5)
nRBC: 0 % (ref 0.0–0.2)

## 2020-11-30 LAB — BASIC METABOLIC PANEL
Anion gap: 6 (ref 5–15)
BUN: 21 mg/dL — ABNORMAL HIGH (ref 6–20)
CO2: 30 mmol/L (ref 22–32)
Calcium: 8 mg/dL — ABNORMAL LOW (ref 8.9–10.3)
Chloride: 105 mmol/L (ref 98–111)
Creatinine, Ser: 0.65 mg/dL (ref 0.61–1.24)
GFR, Estimated: >60 mL/min (ref >60)
Glucose, Bld: 127 mg/dL — ABNORMAL HIGH (ref 70–99)
Potassium: 4.1 mmol/L (ref 3.5–5.1)
Sodium: 141 mmol/L (ref 135–145)

## 2020-11-30 LAB — GLUCOSE, CAPILLARY
Glucose-Capillary: 127 mg/dL — ABNORMAL HIGH (ref 70–99)
Glucose-Capillary: 112 mg/dL — ABNORMAL HIGH (ref 70–99)
Glucose-Capillary: 104 mg/dL — ABNORMAL HIGH (ref 70–99)
Glucose-Capillary: 99 mg/dL (ref 70–99)
Glucose-Capillary: 137 mg/dL — ABNORMAL HIGH (ref 70–99)
Glucose-Capillary: 134 mg/dL — ABNORMAL HIGH (ref 70–99)
Glucose-Capillary: 114 mg/dL — ABNORMAL HIGH (ref 70–99)

## 2020-11-30 LAB — PHOSPHORUS: Phosphorus: 3.2 mg/dL (ref 2.5–4.6)

## 2020-11-30 LAB — PROTEIN, PLEURAL OR PERITONEAL FLUID: Total protein, fluid: 3.1 g/dL

## 2020-11-30 LAB — PROCALCITONIN: Procalcitonin: 0.46 ng/mL

## 2020-11-30 LAB — GLUCOSE, PLEURAL OR PERITONEAL FLUID: Glucose, Fluid: 133 mg/dL

## 2020-11-30 LAB — ALBUMIN, PLEURAL OR PERITONEAL FLUID: Albumin, Fluid: <1.5 g/dL

## 2020-11-30 LAB — MAGNESIUM: Magnesium: 2.2 mg/dL (ref 1.7–2.4)

## 2020-11-30 IMAGING — DX DG CHEST 1V PORT
1 series · 1 of 1 positions shown · non-contrast
Comparison: Chest x-ray [DATE].

CLINICAL DATA: 54-year-old male with history of respiratory
failure.

EXAM:
PORTABLE CHEST 1 VIEW

[chest ap]
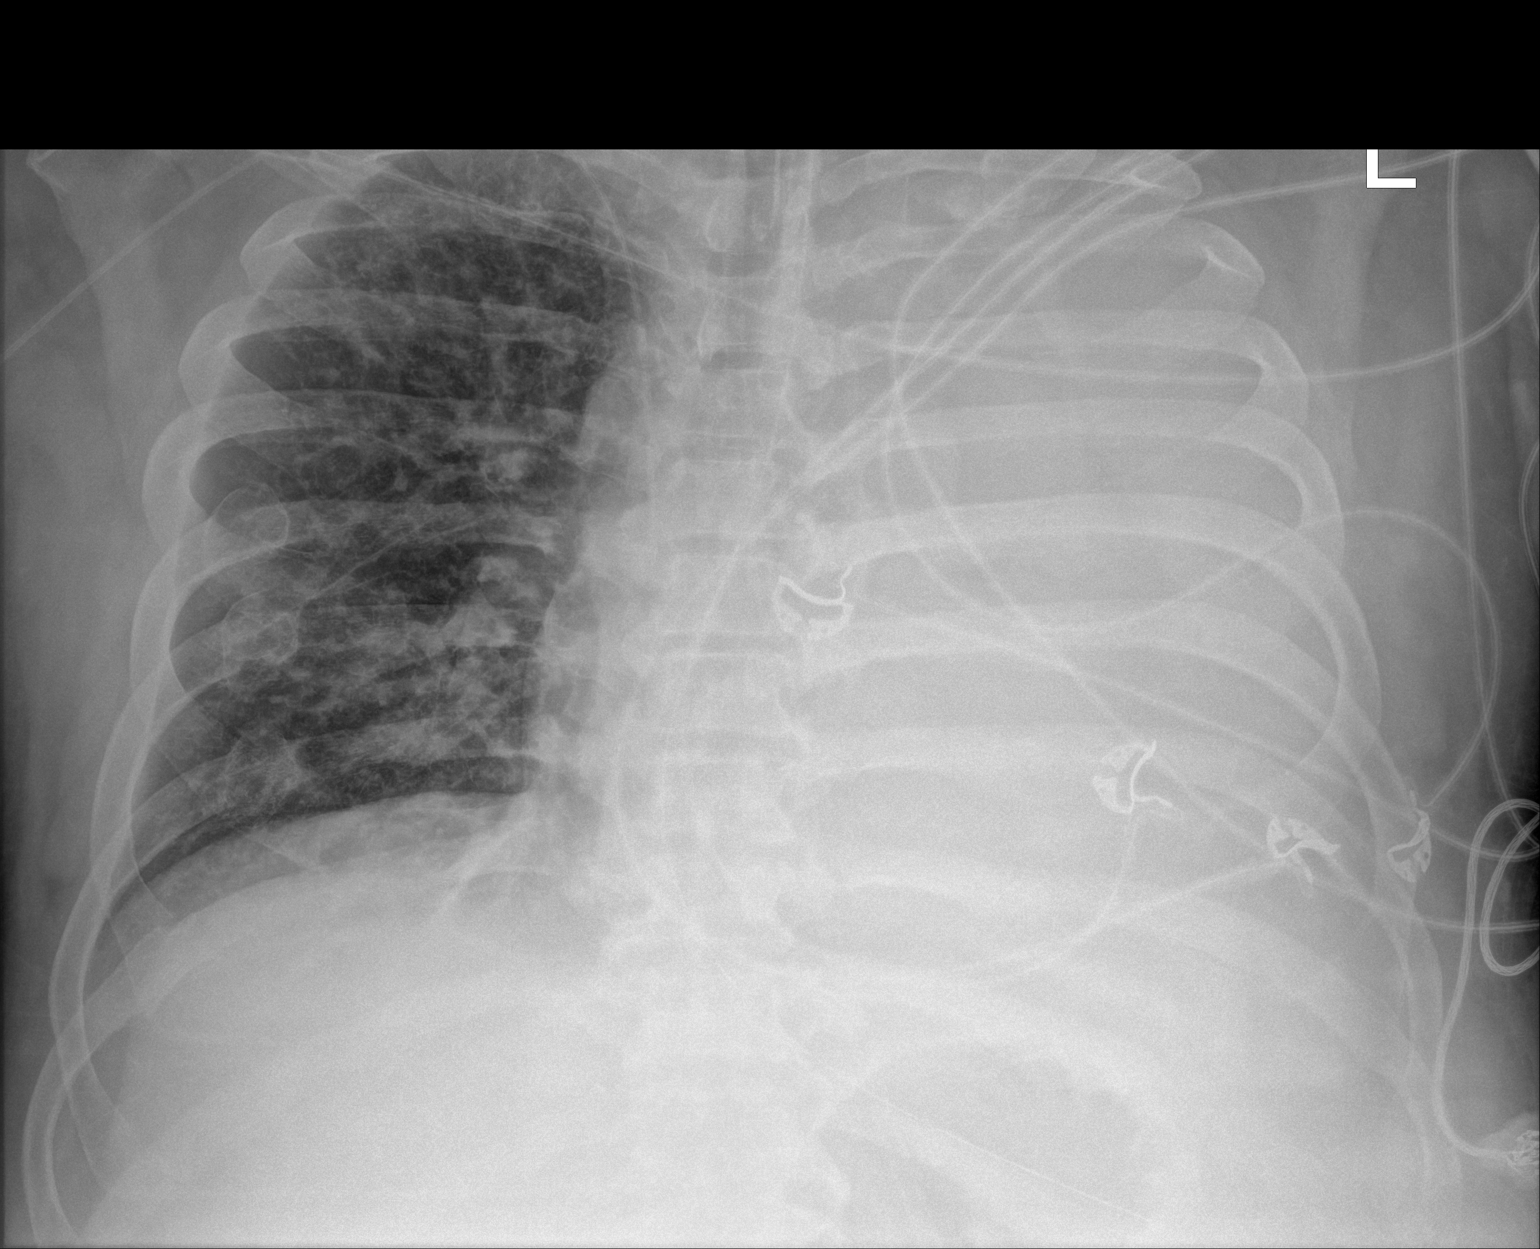

[1 of 1 positions shown; findings below may reference images not displayed]

FINDINGS: An endotracheal tube is in place with tip 6.0 cm above the carina.
There is a right upper extremity PICC with tip terminating in the
distal superior vena cava. A nasogastric tube is seen extending into
the stomach, however, the tip of the nasogastric tube extends below
the lower margin of the image. Complete opacification of the left
hemithorax. Right lung is clear. No right pleural effusion.
Pulmonary vasculature does not appearing origin. Cardiomediastinal
silhouette is obscured. Multiple old healed right-sided rib
fractures posterolaterally are incidentally noted.
IMPRESSION: 1. Support apparatus, as above.
2. Complete opacification of the left hemithorax likely to reflect
interval development of extensive atelectasis and/or consolidation
in the left lung with large left pleural effusion.

## 2020-11-30 IMAGING — DX DG CHEST 1V PORT
1 series · 2 of 2 positions shown · non-contrast
Comparison: [DATE]

CLINICAL DATA: Status post thoracentesis

EXAM:
PORTABLE CHEST 1 VIEW

[Series 1: chest ap · 0.14mm/px · 2 of 2 slices shown]
[im 1/2]
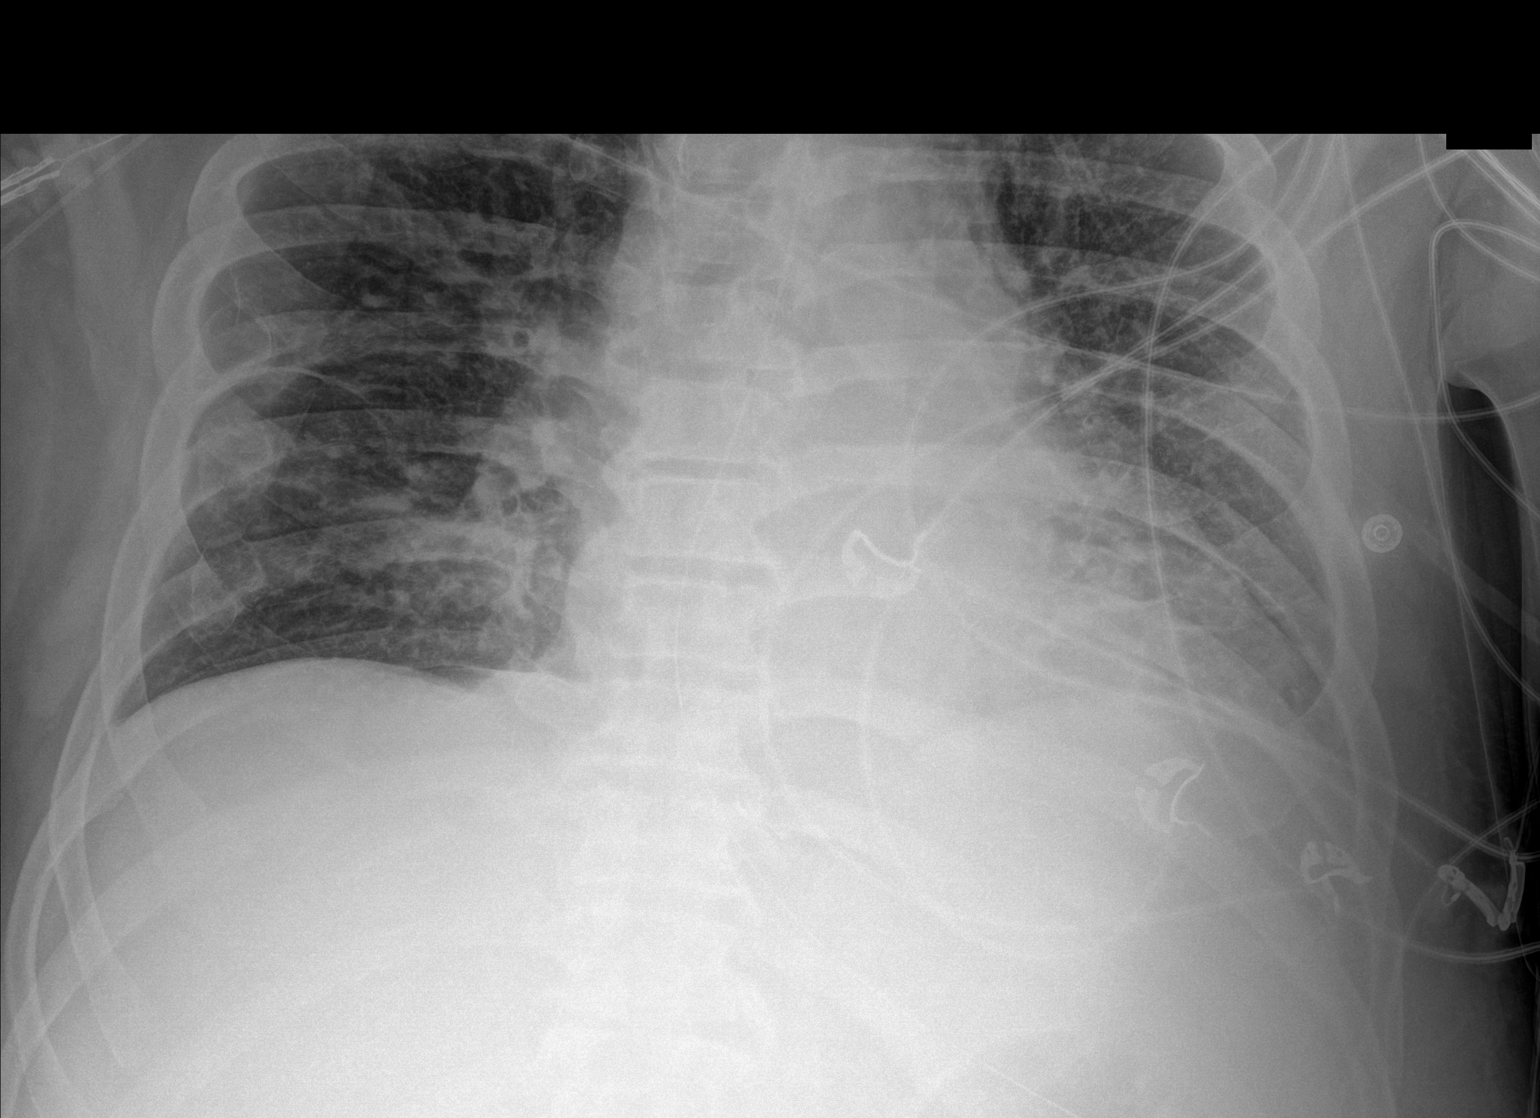
[im 2/2]
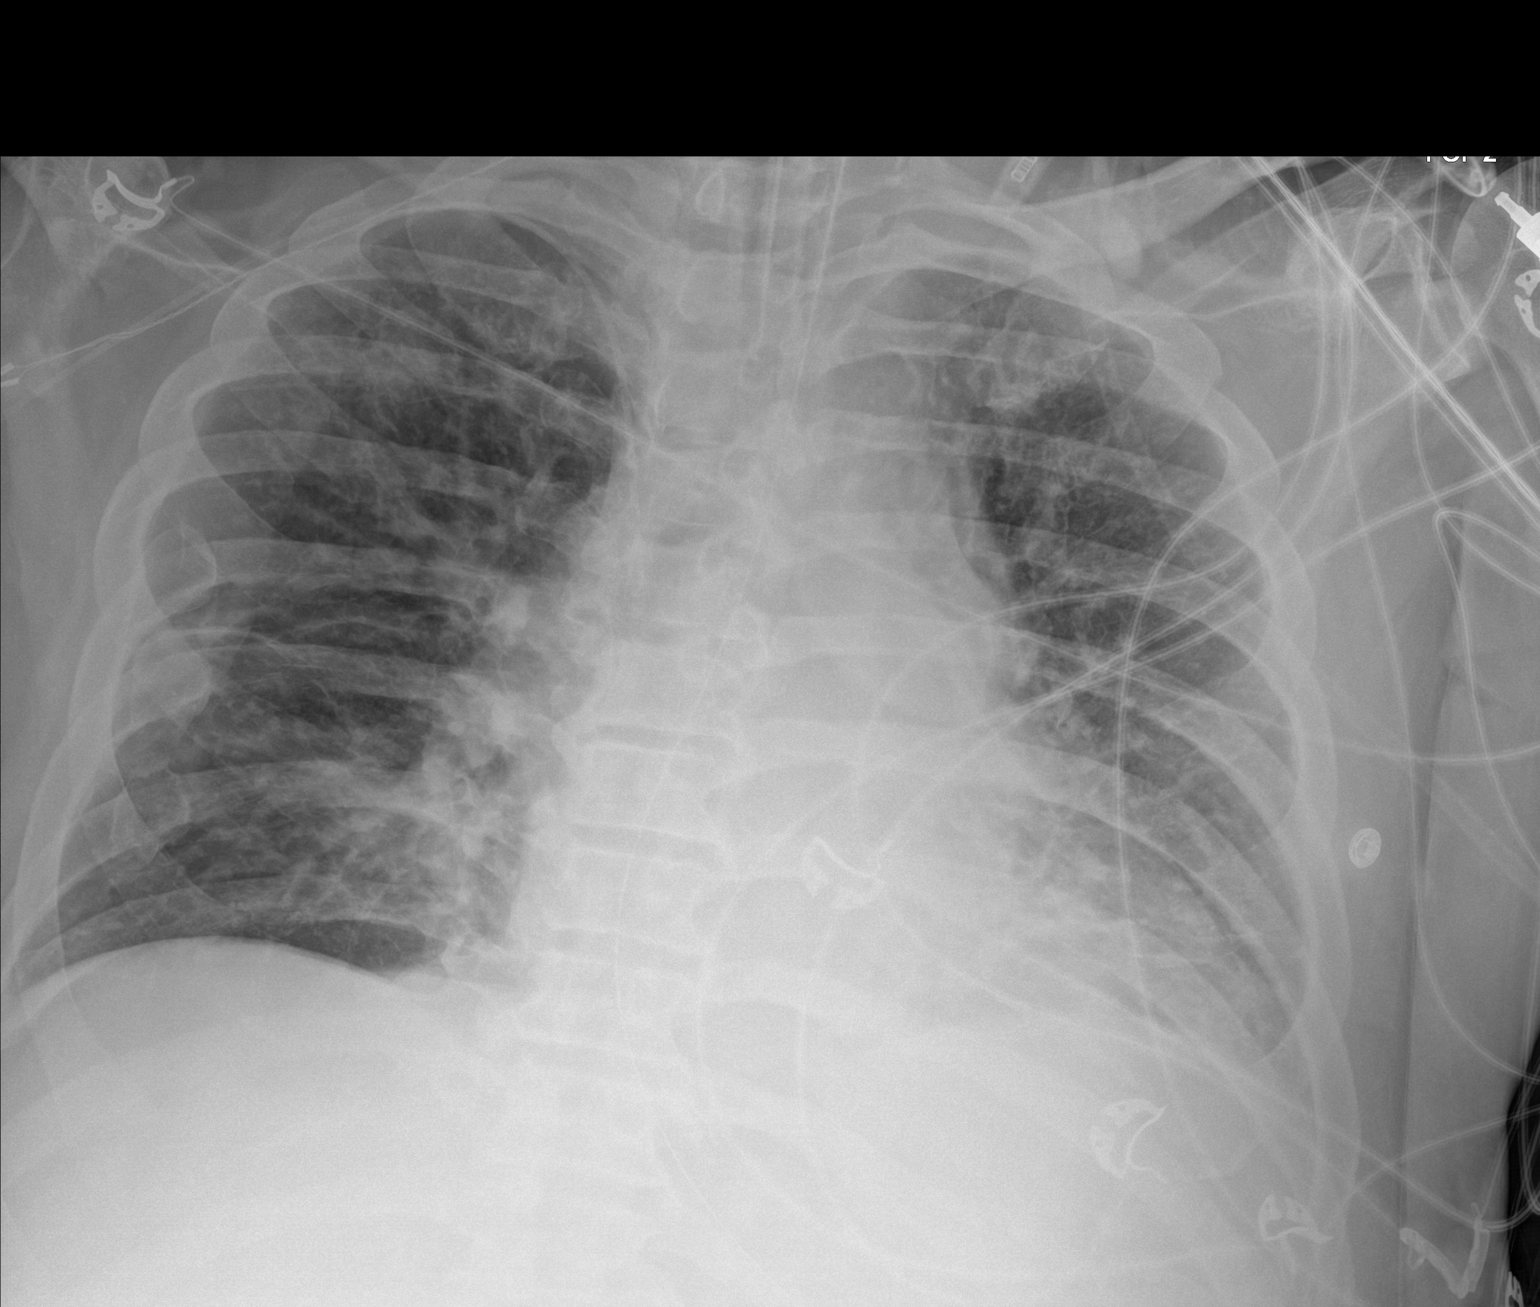

[2 of 2 positions shown; findings below may reference images not displayed]

FINDINGS: Bilateral mild interstitial thickening. No focal consolidation.
Trace left pleural effusion with near complete interval resolution.
No pneumothorax. Heart and mediastinal contours are unremarkable.
Right-sided PICC line with the tip projecting over the SVC.

No acute osseous abnormality.
IMPRESSION: Mild bilateral interstitial thickening likely reflecting
interstitial edema.

## 2020-11-30 IMAGING — US US THORACENTESIS ASP PLEURAL SPACE W/IMG GUIDE
1 series · 8 of 8 positions shown · non-contrast
Comparison: Chest radiograph-earlier same day

MEDICATIONS:
None.

COMPLICATIONS:
None immediate.

INDICATION: Post left-sided thoracentesis.

EXAM:
US THORACENTESIS ASP PLEURAL SPACE W/IMG GUIDE
TECHNIQUE: Informed written consent was obtained from the patient after a
discussion of the risks, benefits and alternatives to treatment. A
timeout was performed prior to the initiation of the procedure.

[Series 1: us thoracentesis asp pleural s · 8 of 8 slices shown]
[im 1/8]
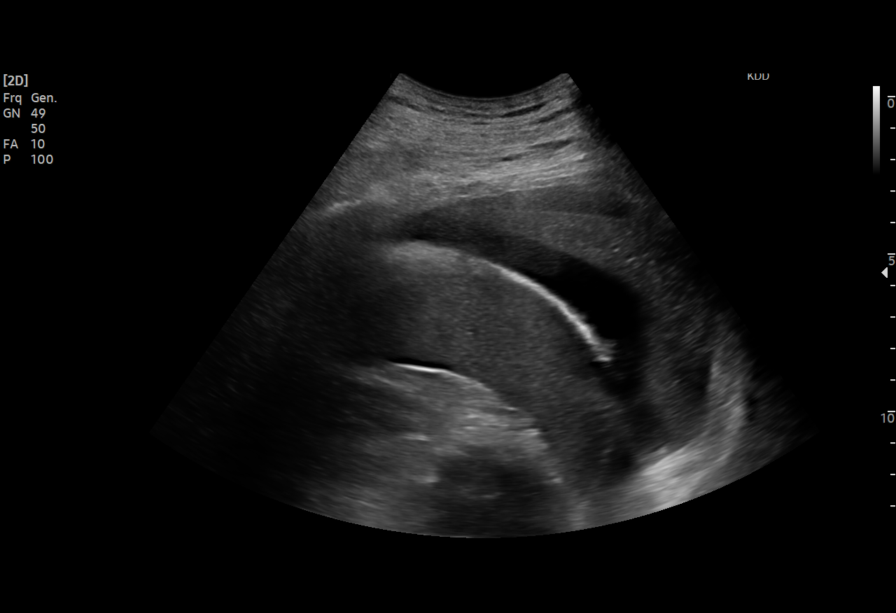
[im 2/8]
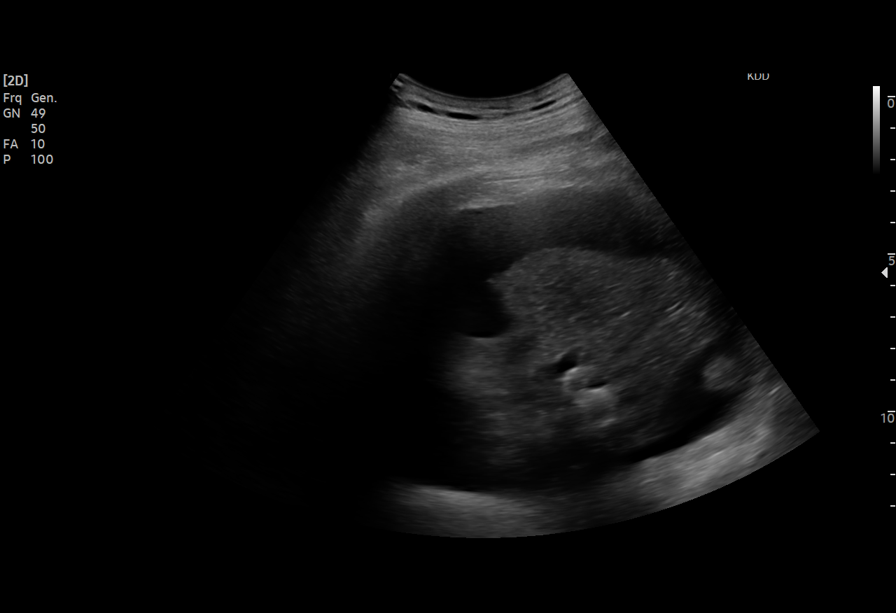
[im 3/8]
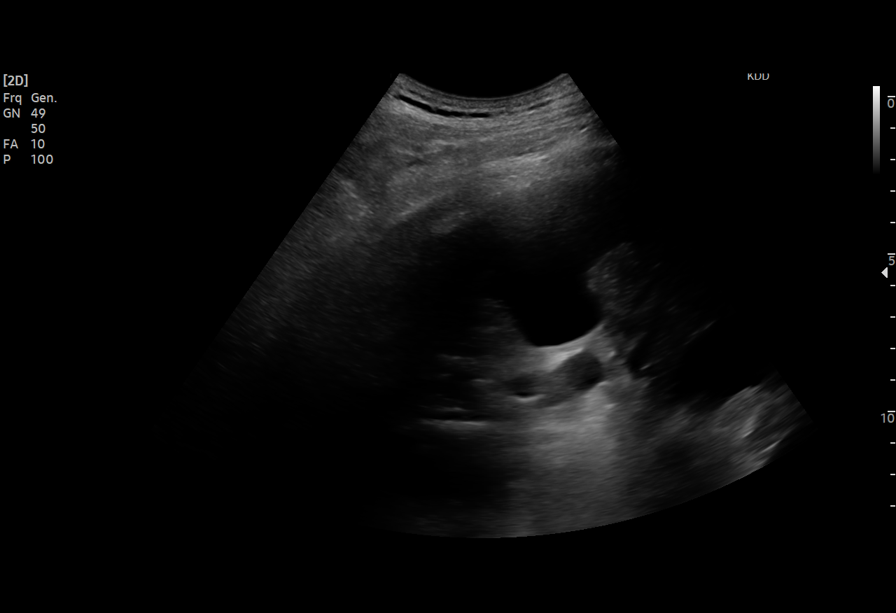
[im 4/8]
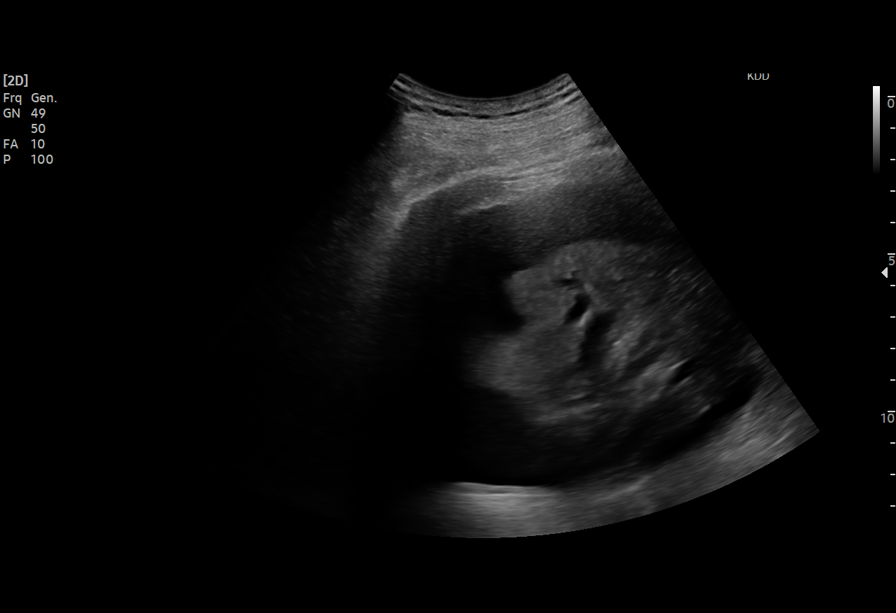
[im 5/8]
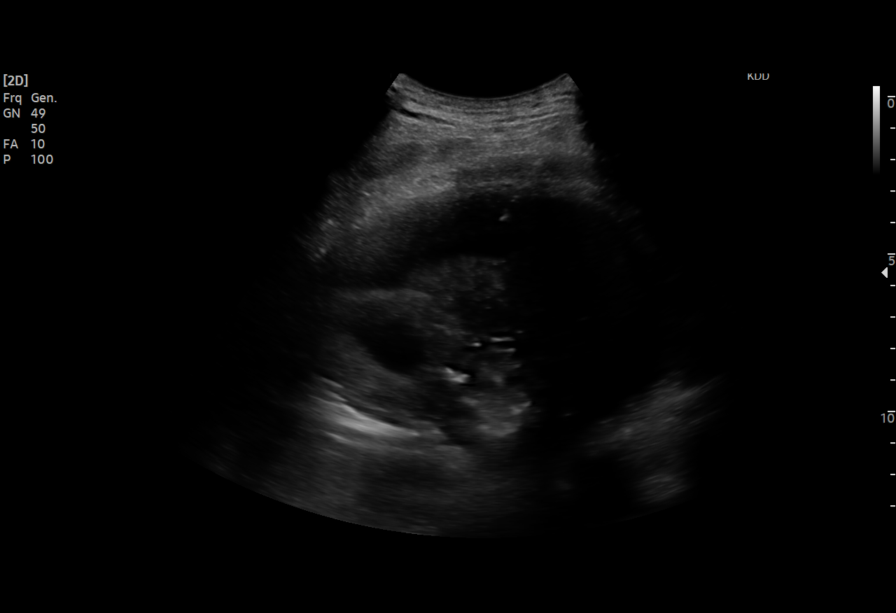
[im 6/8]
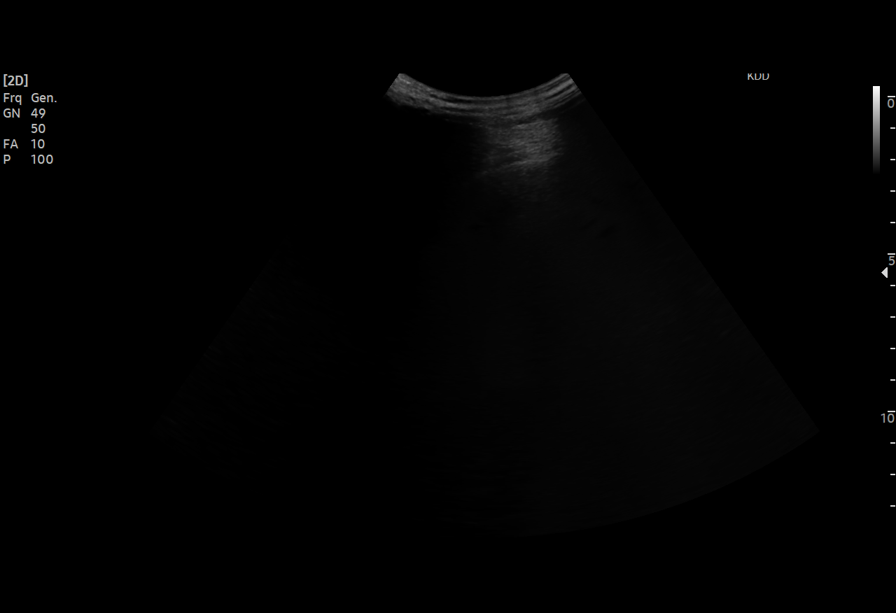
[im 7/8]
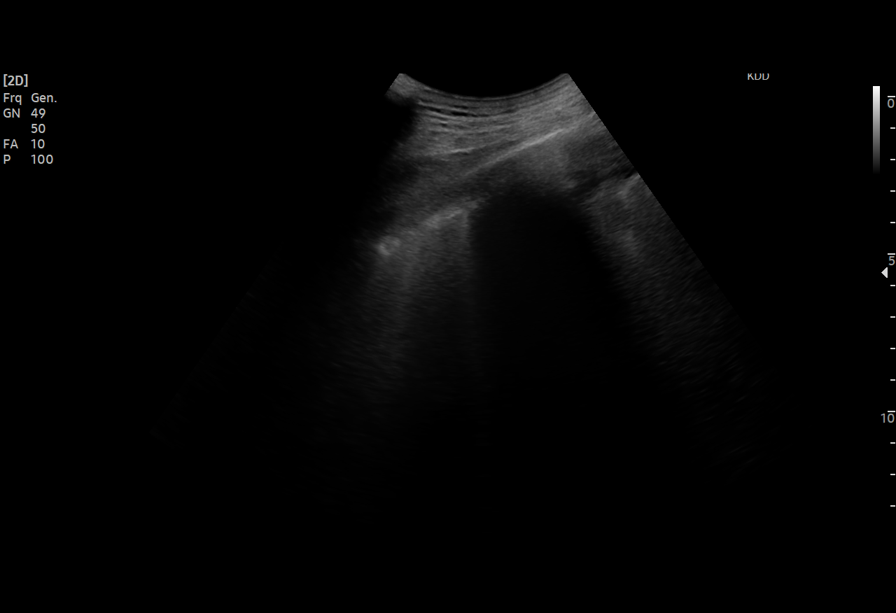
[im 8/8]
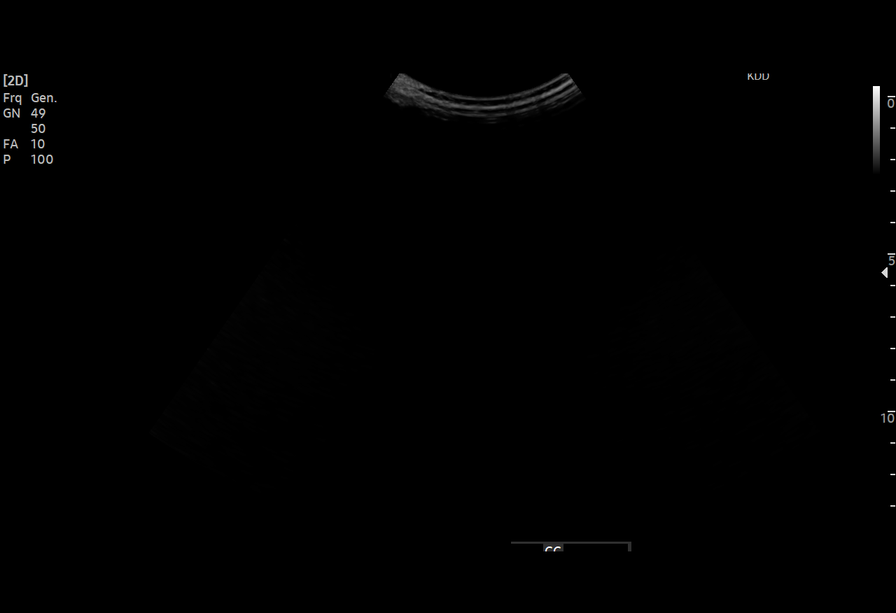

[8 of 8 positions shown; findings below may reference images not displayed]

Initial ultrasound scanning demonstrates a large anechoic left-sided
pleural effusion. The lower chest was prepped and draped in the
usual sterile fashion. 1% lidocaine was used for local anesthesia.
An ultrasound image was saved for documentation purposes. An 8 Fr
Safe-T-Centesis catheter was introduced. The thoracentesis was
performed. The catheter was removed and a dressing was applied. The
patient tolerated the procedure well without immediate post
procedural complication. The patient was escorted to have an upright
chest radiograph.
FINDINGS: A total of approximately 1.2 liters of serous fluid was removed.
Requested samples were sent to the laboratory.
IMPRESSION: Successful ultrasound-guided left sided thoracentesis yielding
liters of pleural fluid.

Above procedure performed by DEVINE, PA.

## 2020-11-30 IMAGING — DX DG CHEST 1V PORT
1 series · 1 of 1 positions shown · non-contrast
Comparison: [DATE] through

CLINICAL DATA: Post bronchoscopy

EXAM:
PORTABLE CHEST 1 VIEW

[chest ap]
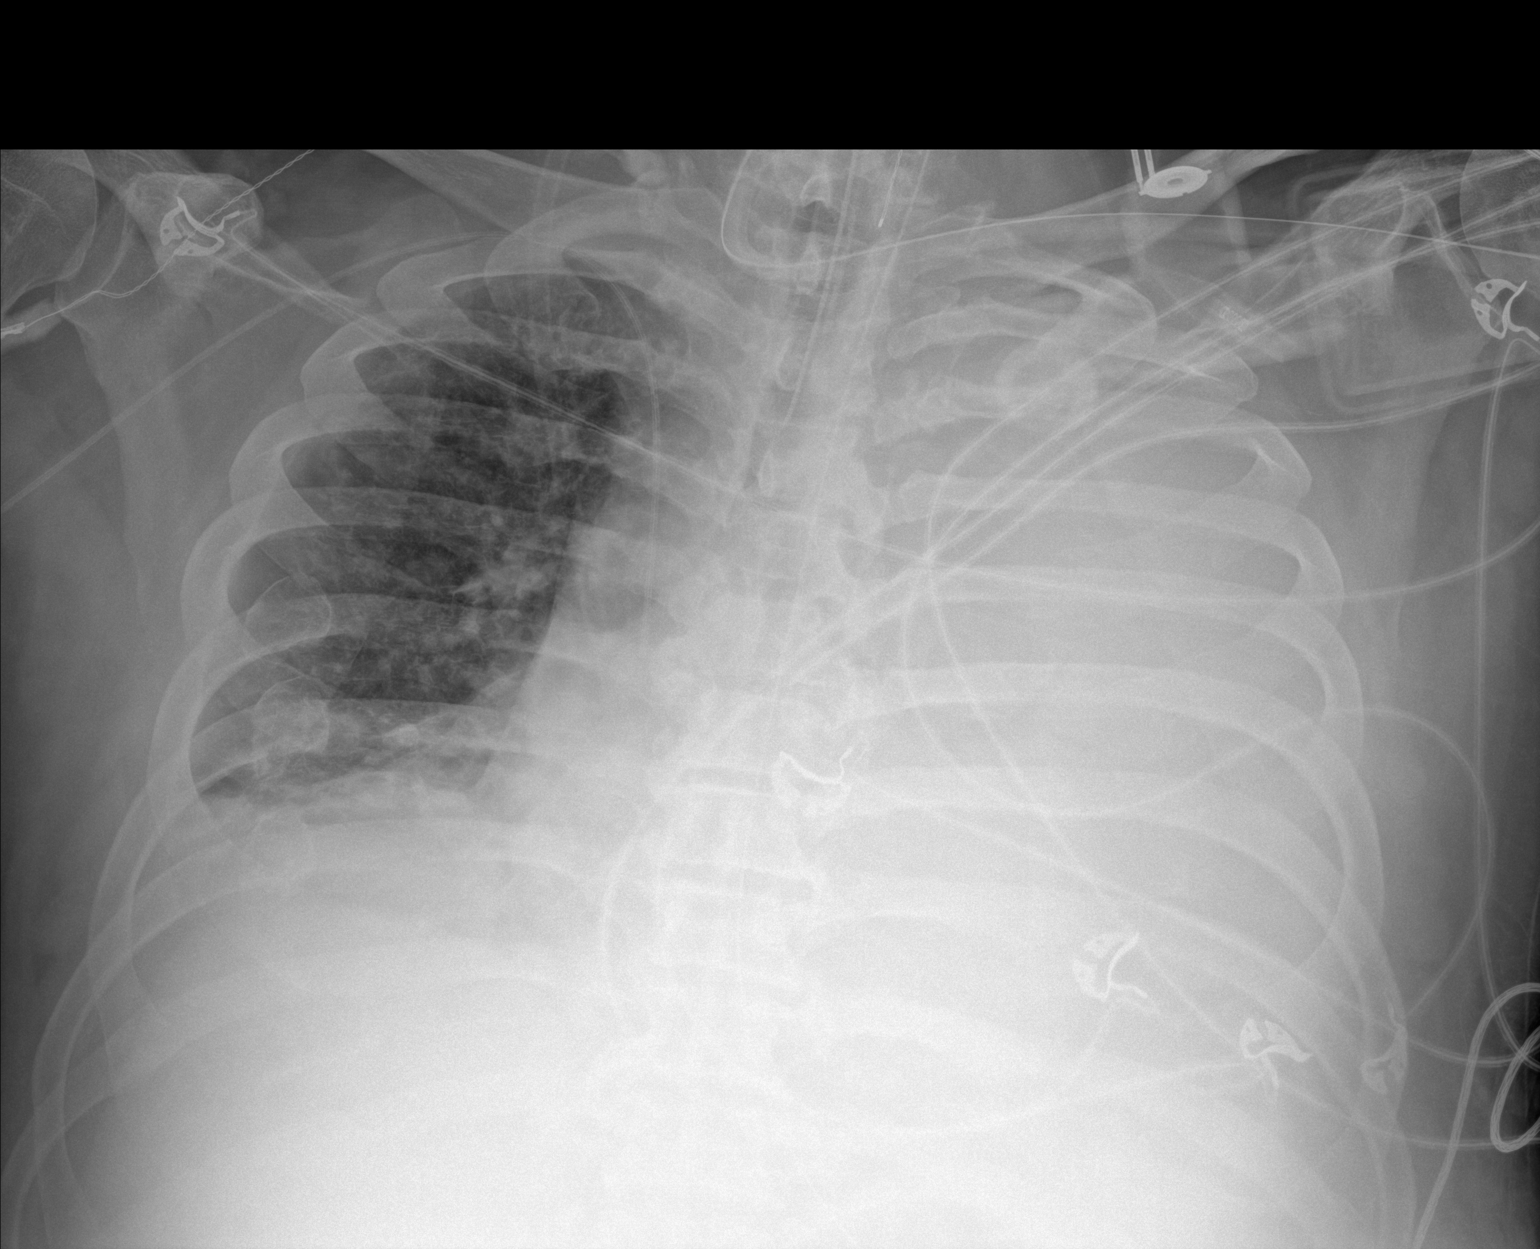

[1 of 1 positions shown; findings below may reference images not displayed]

FINDINGS: Complete opacification of the left hemithorax. Endotracheal tube and
right PICC line remain in place, unchanged. Interval placement of NG
tube into the stomach. Right basilar atelectasis or infiltrate. No
visible pneumothorax.
IMPRESSION: Complete opacification of the left hemithorax, unchanged.

Right basilar atelectasis or infiltrate.

No pneumothorax following bronchoscopy.

## 2020-11-30 MED ORDER — PHENOBARBITAL SODIUM 130 MG/ML IJ SOLN: 97.5000 mg | Freq: Two times a day (BID) | INTRAMUSCULAR | Status: DC

## 2020-11-30 MED ORDER — VECURONIUM BROMIDE 10 MG IV SOLR: 20.0000 mg | Freq: Once | INTRAVENOUS | Status: AC

## 2020-11-30 MED ORDER — STERILE WATER FOR INJECTION IJ SOLN
INTRAMUSCULAR | Status: AC
  Administered 2020-11-30: 10 mL
  Filled 2020-11-30: qty 20

## 2020-11-30 MED ORDER — PHENOBARBITAL SODIUM 130 MG/ML IJ SOLN
130.0000 mg | INTRAMUSCULAR | Status: AC
Start: 2020-11-30 — End: 2020-11-30
  Administered 2020-11-30 (×2): 130 mg via INTRAVENOUS
  Filled 2020-11-30 (×2): qty 1

## 2020-11-30 MED ORDER — STERILE WATER FOR INJECTION IJ SOLN
INTRAMUSCULAR | Status: AC
  Administered 2020-11-30: 10 mL
  Filled 2020-11-30: qty 10

## 2020-11-30 MED ORDER — PHENOBARBITAL SODIUM 65 MG/ML IJ SOLN
65.0000 mg | Freq: Two times a day (BID) | INTRAMUSCULAR | Status: AC
  Administered 2020-12-05 (×2): 65 mg via INTRAVENOUS
  Filled 2020-11-30 (×2): qty 1

## 2020-11-30 MED ORDER — PHENOBARBITAL SODIUM 130 MG/ML IJ SOLN
97.5000 mg | Freq: Two times a day (BID) | INTRAMUSCULAR | Status: AC
  Administered 2020-12-04 (×2): 97.5 mg via INTRAVENOUS
  Filled 2020-11-30 (×2): qty 1

## 2020-11-30 MED ORDER — VECURONIUM BROMIDE 10 MG IV SOLR
INTRAVENOUS | Status: AC
  Administered 2020-11-30: 10 mg via INTRAVENOUS
  Filled 2020-11-30: qty 10

## 2020-11-30 MED ORDER — PHENOBARBITAL SODIUM 130 MG/ML IJ SOLN
130.0000 mg | Freq: Two times a day (BID) | INTRAMUSCULAR | Status: AC
  Administered 2020-11-30 – 2020-12-03 (×7): 130 mg via INTRAVENOUS
  Filled 2020-11-30 (×7): qty 1

## 2020-11-30 MED ORDER — PHENOBARBITAL SODIUM 65 MG/ML IJ SOLN: 32.5000 mg | Freq: Two times a day (BID) | INTRAMUSCULAR | Status: DC

## 2020-11-30 MED ORDER — PHENOBARBITAL SODIUM 65 MG/ML IJ SOLN: 16.2500 mg | Freq: Two times a day (BID) | INTRAMUSCULAR | Status: DC

## 2020-11-30 MED ORDER — SODIUM CHLORIDE 0.9 % IV SOLN
260.0000 mg | Freq: Once | INTRAVENOUS | Status: DC
  Filled 2020-11-30: qty 2

## 2020-11-30 MED ORDER — PHENOBARBITAL SODIUM 65 MG/ML IJ SOLN
32.5000 mg | Freq: Two times a day (BID) | INTRAMUSCULAR | Status: DC
  Administered 2020-12-06: 32.5 mg via INTRAVENOUS
  Filled 2020-11-30: qty 1

## 2020-11-30 MED ORDER — PHENOBARBITAL SODIUM 130 MG/ML IJ SOLN: 130.0000 mg | Freq: Two times a day (BID) | INTRAMUSCULAR | Status: DC

## 2020-11-30 MED ORDER — PHENOBARBITAL SODIUM 65 MG/ML IJ SOLN: 65.0000 mg | Freq: Two times a day (BID) | INTRAMUSCULAR | Status: DC

## 2020-11-30 MED ORDER — VECURONIUM BROMIDE 10 MG IV SOLR
20.0000 mg | Freq: Once | INTRAVENOUS | Status: AC
  Administered 2020-11-30: 20 mg via INTRAVENOUS
  Filled 2020-11-30: qty 20

## 2020-11-30 MED ORDER — FUROSEMIDE 10 MG/ML IJ SOLN
60.0000 mg | Freq: Once | INTRAMUSCULAR | Status: AC
  Administered 2020-11-30: 60 mg via INTRAVENOUS
  Filled 2020-11-30: qty 6

## 2020-11-30 NOTE — Progress Notes (Signed)
Nutrition Follow-up  DOCUMENTATION CODES:  Not applicable  INTERVENTION:  Continue the following tube feed regimen: Vital High Protein at 70 ml/h (1680 ml per day) with Prosource TF 45 ml TID (11g of protein and 40kcal/packet) Free water flush 68mL q4h to maintain tube patency  Provides 1800 kcal, 180 gm protein, 1585 ml free water daily (TF+flush) Continue vitamin regimen for hx of EtOH abuse  NUTRITION DIAGNOSIS:  Inadequate oral intake related to chronic illness (EtOH abuse) as evidenced by per patient/family report.  GOAL:  Patient will meet greater than or equal to 90% of their needs  MONITOR:  TF tolerance, Vent status, Labs, Weight trends, I & O's, Diet advancement  REASON FOR ASSESSMENT:  Ventilator, Consult Enteral/tube feeding initiation and management  ASSESSMENT:  55 yo male with a PMH of alcohol abuse/dependence (1.5 gallons rum per week) initially presented to ED with complaints of SOB and coughing up black phlegm. Pt also reported he had a fall on 9/16 and has had back pain since that time. Admitted 9/19.  While in ED, pt became increasingly agitated and required sedation and subsequent intubation. Family reports that pt had been reducing EtOH consumption. Episode was determined to be the result of withdrawal.   Pt noted to have L1 compression fracture after fall - no surgery planned.  9/19 - intubated; NGT placed 9/20 - TF initiated 9/23 - LP due to persistent fevers and encephalopathy (no significant findings)  Pt remains intubated and sedated. TF well tolerated infusing at goal rate of 102mL/h. Discussed in ICU rounds. New chest XR this AM showed complete opacification to the left lung. MD plans for bronchoscopy today and possible thoracentesis. TF to be held prior to procedure and resumed afterwards.   MV: 10.5 L/min Temp (24hrs), Avg:98.7 F (37.1 C), Min:97.7 F (36.5 C), Max:102 F (38.9 C)   Intake/Output Summary (Last 24 hours) at 11/30/2020  0948 Last data filed at 11/30/2020 9628 Gross per 24 hour  Intake 5161.41 ml  Output 3360 ml  Net 1801.41 ml  Net IO Since Admission: 13,699.18 mL [11/30/20 0948]  Nutritionally Relevant Medications: Scheduled Meds:  docusate  100 mg Per Tube BID   feeding supplement (PROSource TF)  45 mL Per Tube TID   folic acid  1 mg Per Tube Daily   free water  30 mL Per Tube Q4H   methylPREDNISolone injection  40 mg Intravenous Q24H   multivitamin with minerals  1 tablet Per Tube Daily   pantoprazole (PROTONIX) IV  40 mg Intravenous QHS   polyethylene glycol  17 g Per Tube Daily   thiamine injection  100 mg Intravenous Daily   Continuous Infusions:  sodium chloride 75 mL/hr at 11/30/20 0411   ampicillin-sulbactam (UNASYN) IV 3 g (11/30/20 0921)   feeding supplement (VITAL HIGH PROTEIN) 1,000 mL (11/30/20 0918)   fentaNYL infusion INTRAVENOUS 400 mcg/hr (11/30/20 0739)   midazolam 10 mg/hr (11/30/20 0328)   Labs Reviewed BUN 21  NUTRITION - FOCUSED PHYSICAL EXAM: Flowsheet Row Most Recent Value  Orbital Region Mild depletion  Upper Arm Region No depletion  Thoracic and Lumbar Region No depletion  Buccal Region Unable to assess  Temple Region Mild depletion  Clavicle Bone Region No depletion  Clavicle and Acromion Bone Region No depletion  Scapular Bone Region No depletion  Dorsal Hand No depletion  Patellar Region No depletion  Anterior Thigh Region Mild depletion  Posterior Calf Region No depletion  Edema (RD Assessment) Mild  [R ankle]  Hair Reviewed  Eyes Unable to assess  Mouth Unable to assess  Skin Reviewed  Nails Reviewed   Diet Order:   Diet Order             Diet NPO time specified  Diet effective now                  EDUCATION NEEDS:  Not appropriate for education at this time  Skin:  Skin Assessment: Reviewed RN Assessment (Ecchymosis)  Last BM:  9/28 - type 7  Height:  Ht Readings from Last 1 Encounters:  11/29/20 6\' 3"  (1.905 m)   Weight:  Wt  Readings from Last 1 Encounters:  11/30/20 135.7 kg   BMI:  Body mass index is 37.39 kg/m.  Estimated Nutritional Needs:  Kcal:  1440-1833 kcal/d (11-14 kcal/kg (9/23 wt)) Protein:  178g/d (2g/kg IBW) Fluid:  >2 L  10-28-2003, RD, LDN Clinical Dietitian Pager on Amion

## 2020-11-30 NOTE — Progress Notes (Signed)
GOALS OF CARE DISCUSSION  The Clinical status was relayed to family in detail. Allison Significant Other at Bedside  Updated and notified of patients medical condition.    Patient remains unresponsive and will not open eyes to command.   Patient is having a weak cough and struggling to remove secretions.   Patient with increased WOB and using accessory muscles to breathe Explained to family course of therapy and the modalities    Patient with Progressive multiorgan failure with a very high probablity of a very minimal chance of meaningful recovery despite all aggressive and optimal medical therapy.  PATIENT REMAINS FULL CODE  Family understands the situation.  Family are satisfied with Plan of action and management. All questions answered  Additional CC time 25 mins   Novelle Addair Santiago Glad, M.D.  [facility]  Medical Director [facility] Medical Director [facility] Cardio-Pulmonary Department

## 2020-11-30 NOTE — Procedures (Addendum)
  PROCEDURE: BRONCHOSCOPY Therapeutic Aspiration of Tracheobronchial Tree  PROCEDURE DATE: 11/30/2020  TIME:  NAMEDerin Harper  DOB:September 23, 1965  MRN: [id number] LOC:  IC10A/IC10A-AA      Indications/Preliminary Diagnosis: pneumonia Left lung collapse  Consent: (Place X beside choice/s below)  The benefits, risks and possible complications of the procedure were        explained to:  ___ patient  _x__ patient's family  ___ other:___________  who verbalized understanding and gave:  ___ verbal  ___ written  _x__ verbal and written  ___ telephone  ___ other:________ consent.      Unable to obtain consent; procedure performed on emergent basis.     Other:     The Risks and Benefits of the Bronchoscopy procedure with Lavage were explained to family.  I have discussed the risk for Acute Bleeding, increased chance of Infection, increased chance of Respiratory Failure and Cardiac Arrest, increased chance of pneumothorax and collapsed lung, as well as increased Stroke and Death.  The family understand the risks and benefits and have agreed to proceed with procedure.    PRESEDATION ASSESSMENT: History and Physical has been performed. Patient meds and allergies have been reviewed. Presedation airway examination has been performed and documented. Baseline vital signs, sedation score, oxygenation status, and cardiac rhythm were reviewed. Patient was deemed to be in satisfactory condition to undergo the procedure.    PREMEDICATIONS:   Sedative/Narcotic Amt Dose   Versed infusion    Fentanyl infusion   Diprivan  mg  VECURONIUM 20 mg        PROCEDURE DETAILS: Timeout performed and correct patient, name, & ID confirmed. Following prep per Pulmonary policy, appropriate sedation was administered. The Bronchoscope was inserted in to oral cavity with bite block in place. Therapeutic aspiration of Tracheobronchial tree was performed.  Airway exam proceeded with findings, technical  procedures, and specimen collection as noted below. At the end of exam the scope was withdrawn without incident. Impression and Plan as noted below.           Airway Prep (Place X beside choice below)   1% Transtracheal Lidocaine Anesthetization 7 cc   Patient prepped per Bronchoscopy Lab Policy       Insertion Route (Place X beside choice below)   Nasal   Oral  x Endotracheal Tube   Tracheostomy     TECHNICAL PROCEDURES: (Place X beside choice below)   Procedures  Description    None     Electrocautery     Cryotherapy     Balloon Dilatation     Bronchography     Stent Placement   x  Therapeutic Aspiration Thick mucoid secretions from all segments of lungs bilaterally    Laser/Argon Plasma    Brachytherapy Catheter Placement    Foreign Body Removal         SPECIMENS (Sites): (Place X beside choice below)  Specimens Description   No Specimens Obtained     Washings   x Lavage    Biopsies    Fine Needle Aspirates    Brushings    Sputum    FINDINGS: THICK MUCOID SECRETIONS, PNEUMONIA ESTIMATED BLOOD LOSS: none COMPLICATIONS/RESOLUTION: none      IMPRESSION:POST-PROCEDURE DX:  Pneumonia Left lung collapse    RECOMMENDATION/PLAN:  Vent support Follow up Cultures     Seth Harper, M.D.  [facility]  Medical Director [facility] Medical Director [facility] Cardio-Pulmonary Department

## 2020-11-30 NOTE — Progress Notes (Signed)
*  PRELIMINARY RESULTS* Echocardiogram 2D Echocardiogram has been performed.  Cristela Blue 11/30/2020, 9:02 AM

## 2020-11-30 NOTE — Progress Notes (Signed)
NAME:  Seth Harper, MRN:  [id number], DOB:  [birth year], LOS: 9 ADMISSION DATE:  11/21/2020  History of Present Illness:  Seth Harper is a 55 year old male with history of alcohol abuse/dependence who presented to the ER on 9/19 with progressive shortness of breath, dark colored sputum, back pain and thumb pain. He started to have hallucinations with increasing altered mentation while in the ER. Further history obtained by the ER nursing staff from the patient's significant other who reports the patient fell last Friday and lost consciousness. She says he has not slept in days, usually drinks 1.5 gallons of rum per week and his last drink was on Friday prior to the fall. Due to his increasing agitation and altered mentation he was sedated and intubated in the ER. PCCM has been called to admit the patient.    Pertinent  Medical History  EtOH abuse/dependence   Significant Hospital Events: Including procedures, antibiotic start and stop dates in addition to other pertinent events   9/19 Intubated, admitted to the ICU.  LP unsuccessful in the ED.  CT MRI head and EEG negative 9/20 changed to PCV due to vent dyssynchrony, lactic acid improved 9/22: Persistent fevers, recollect Tracheal aspirate, Doppler US BLE, consult ID.  KUB concerning for ileus vs SBO 9/23: Ileus resolved, to go for Fluro guided LP and MRI Brain w/ contrast, ABX changed to Meropenem, Acyclovir added  9/24: Uneventful night, started Precedex to wean off propofol and Versed 9/25: Agitated off midazolam, on Precedex, had to start some propofol.  Initiated through [facility]. 9/26 severe agitation, MRI BRAIN NEG, EEG WNL,  9/27 severe hypoxia, remains on vent     Interim History / Subjective:  Remains on vent Severe hypoxiia Check CXR Severe agitation    Antimicrobials:   Antibiotics Given (last 72 hours)     Date/Time Action Medication Dose Rate   11/27/20 0748 New Bag/Given   acyclovir (ZOVIRAX) 955 mg in dextrose 5 %  150 mL IVPB 955 mg 169.1 mL/hr   11/27/20 1432 New Bag/Given   meropenem (MERREM) 2 g in sodium chloride 0.9 % 100 mL IVPB 2 g 200 mL/hr   11/27/20 1551 New Bag/Given   acyclovir (ZOVIRAX) 955 mg in dextrose 5 % 150 mL IVPB 955 mg 169.1 mL/hr   11/27/20 2154 New Bag/Given   meropenem (MERREM) 2 g in sodium chloride 0.9 % 100 mL IVPB 2 g 200 mL/hr   11/27/20 2356 New Bag/Given   acyclovir (ZOVIRAX) 955 mg in dextrose 5 % 150 mL IVPB 955 mg 169.1 mL/hr   11/28/20 0513 New Bag/Given   meropenem (MERREM) 2 g in sodium chloride 0.9 % 100 mL IVPB 2 g 200 mL/hr   11/28/20 0747 New Bag/Given   acyclovir (ZOVIRAX) 955 mg in dextrose 5 % 150 mL IVPB 955 mg 169.1 mL/hr   11/28/20 1437 New Bag/Given   meropenem (MERREM) 2 g in sodium chloride 0.9 % 100 mL IVPB 2 g 200 mL/hr   11/28/20 1813 New Bag/Given   acyclovir (ZOVIRAX) 955 mg in dextrose 5 % 150 mL IVPB 955 mg 169.1 mL/hr   11/28/20 2126 New Bag/Given   meropenem (MERREM) 2 g in sodium chloride 0.9 % 100 mL IVPB 2 g 200 mL/hr   11/28/20 2308 New Bag/Given   acyclovir (ZOVIRAX) 955 mg in dextrose 5 % 150 mL IVPB 955 mg 169.1 mL/hr   11/29/20 0527 New Bag/Given   meropenem (MERREM) 2 g in sodium chloride 0.9 % 100 mL IVPB  2 g 200 mL/hr   11/29/20 0830 New Bag/Given   acyclovir (ZOVIRAX) 955 mg in dextrose 5 % 150 mL IVPB 955 mg 169.1 mL/hr   11/29/20 1406 New Bag/Given   meropenem (MERREM) 2 g in sodium chloride 0.9 % 100 mL IVPB 2 g 200 mL/hr   11/29/20 2150 New Bag/Given   Ampicillin-Sulbactam (UNASYN) 3 g in sodium chloride 0.9 % 100 mL IVPB 3 g 200 mL/hr   11/30/20 0312 New Bag/Given   Ampicillin-Sulbactam (UNASYN) 3 g in sodium chloride 0.9 % 100 mL IVPB 3 g 200 mL/hr             Objective   Blood pressure 104/69, pulse 91, temperature 97.7 F (36.5 C), resp. rate 20, height _0  (1.905 m), weight 135.7 kg, SpO2 95 %.    Vent Mode: PCV FiO2 (%):  [40 %-50 %] 40 % Set Rate:  [20 bmp] 20 bmp PEEP:  [5 cmH20] 5  cmH20 Plateau Pressure:  [17 cmH20] 17 cmH20   Intake/Output Summary (Last 24 hours) at 11/30/2020 0729 Last data filed at 11/30/2020 2248 Gross per 24 hour  Intake 5281.41 ml  Output 3360 ml  Net 1921.41 ml   Filed Weights   11/28/20 0500 11/29/20 0428 11/30/20 0406  Weight: 134.2 kg (!) 137.5 kg 135.7 kg      REVIEW OF SYSTEMS  PATIENT IS UNABLE TO PROVIDE COMPLETE REVIEW OF SYSTEMS DUE TO SEVERE CRITICAL ILLNESS AND TOXIC METABOLIC ENCEPHALOPATHY  ALL OTHER ROS ARE NEGATIVE   PHYSICAL EXAMINATION:  GENERAL:critically ill appearing, +resp distress EYES: Pupils equal, round, reactive to light.  No scleral icterus.  MOUTH: Moist mucosal membrane. INTUBATED NECK: Supple.  PULMONARY: +rhonchi, +wheezing CARDIOVASCULAR: S1 and S2.  No murmurs  GASTROINTESTINAL: Soft, nontender, -distended. Positive bowel sounds.  MUSCULOSKELETAL: No swelling, clubbing, or edema.  NEUROLOGIC: obtunded SKIN:intact,warm,dry    Labs/imaging that I havepersonally reviewed  (right click and "Reselect all SmartList Selections" daily)     ASSESSMENT AND PLAN SYNOPSIS  55 yo obese white male with acute and severe resp failure on MV support with toxic metabolic encephalopathy from ETOH withdrawal with ileus  Severe ACUTE Hypoxic and Hypercapnic Respiratory Failure -continue Mechanical Ventilator support -continue Bronchodilator Therapy -Wean Fio2 and PEEP as tolerated -VAP/VENT bundle implementation -will perform SAT/SBT when respiratory parameters are met Check CXR  Vent Mode: PCV FiO2 (%):  [40 %-50 %] 40 % Set Rate:  [20 bmp] 20 bmp PEEP:  [5 cmH20] 5 cmH20 Plateau Pressure:  [17 cmH20] 17 cmH20   SEVERE COPD EXACERBATION -continue IV steroids as prescribed -continue NEB THERAPY as prescribed -morphine as needed -wean fio2 as needed and tolerated Acute Hypoxemic Respiratory Failure due to Community Acquired Pneumonia Emphysema Consider BRonchoscopy    SEVERE ALCOHOL  WITHDRAWAL -Therapy with Thiamine and MVI -CIWA Protocol   CARDIAC FAILURE-echo pending -oxygen as needed -Lasix as tolerated -follow up cardiac enzymes as indicated   CARDIAC ICU monitoring    SEVERE SEPSIS  SOURCE-CAP -use vasopressors to keep MAP>65 as needed -follow ABG and LA as needed -follow up cultures  NEUROLOGY Acute toxic metabolic encephalopathy, need for sedation Goal RASS -2 to -3   INFECTIOUS DISEASE -continue antibiotics as prescribed -follow up cultures -follow up ID consultation  ENDO - ICU hypoglycemic\Hyperglycemia protocol -check FSBS per protocol   GI GI PROPHYLAXIS as indicated  NUTRITIONAL STATUS DIET-->TF's as tolerated Constipation protocol as indicated   ELECTROLYTES -follow labs as needed -replace as needed -pharmacy consultation and following  Best Practice (right click and "Reselect all SmartList Selections" daily)    Diet/type: NPO, tube feeds DVT prophylaxis: prophylactic heparin  GI prophylaxis: PPI Lines: N/A Foley:  yes, and is still needed Code Status:  full code ICU STATUS     Labs   CBC: Recent Labs  Lab 11/24/20 0511 11/25/20 0542 11/26/20 0437 11/27/20 0614 11/28/20 0459 11/30/20 0401  WBC 9.2  9.1 12.9* 12.7* 10.2 9.4 11.5*  NEUTROABS 5.8 9.7* 9.1* 7.3  --  8.1*  HGB 13.7  13.8 14.5 13.3 13.5 13.5 11.7*  HCT 40.9  40.8 42.9 39.5 40.4 40.7 34.6*  MCV 107.9*  107.9* 107.8* 107.0* 107.4* 107.1* 105.5*  PLT 173  156 178 225 274 315 937    Basic Metabolic Panel: Recent Labs  Lab 11/24/20 0511 11/25/20 0542 11/26/20 0437 11/27/20 0614 11/28/20 0459 11/29/20 0342 11/30/20 0401  NA 140   < > 146* 147* 144 141 141  K 4.1   < > 4.1 4.1 4.0 4.1 4.1  CL 101   < > 108 108 108 106 105  CO2 31   < > _0 GLUCOSE 121*   < > 120* 109* 97 115* 127*  BUN 22*   < > 27* 24* 17 19 21*  CREATININE 0.78   < > 0.73 0.67 0.63 0.81 0.65  CALCIUM 8.2*   < > 7.8* 8.1* 8.1* 7.9* 8.0*  MG  2.3  --  2.8* 2.4 2.3 2.5* 2.2  PHOS 4.4  --  3.4 3.0 3.8  --  3.2   < > = values in this interval not displayed.   GFR: Estimated Creatinine Clearance: 158.6 mL/min (by C-G formula based on SCr of 0.65 mg/dL). Recent Labs  Lab 11/24/20 1434 11/24/20 1726 11/25/20 0542 11/26/20 0437 11/27/20 0614 11/28/20 0459 11/29/20 0342 11/30/20 0401  PROCALCITON  --   --  2.63 2.17 1.18 0.72 0.92 0.46  WBC  --   --  12.9* 12.7* 10.2 9.4  --  11.5*  LATICACIDVEN 0.9 1.0 2.0*  --   --   --   --   --     Liver Function Tests: Recent Labs  Lab 11/26/20 0437  AST 41  ALT 32  ALKPHOS 102  BILITOT 0.9  PROT 6.0*  ALBUMIN 2.0*   No results for input(s): LIPASE, AMYLASE in the last 168 hours. No results for input(s): AMMONIA in the last 168 hours.  ABG    Component Value Date/Time   PHART 7.27 (L) 11/28/2020 1349   PCO2ART 70 (HH) 11/28/2020 1349   PO2ART 79 (L) 11/28/2020 1349   HCO3 31.5 (H) 11/28/2020 1349   ACIDBASEDEF 0.7 11/22/2020 0353   O2SAT 94.6 11/28/2020 1349     Coagulation Profile: No results for input(s): INR, PROTIME in the last 168 hours.  Cardiac Enzymes: No results for input(s): CKTOTAL, CKMB, CKMBINDEX, TROPONINI in the last 168 hours.  HbA1C: No results found for: HGBA1C  CBG: Recent Labs  Lab 11/29/20 0350 11/29/20 0740 11/29/20 1118 11/29/20 1620 11/29/20 1940  GLUCAP 112* 86 86 116* 120*    Allergies No Known Allergies     DVT/GI PRX  assessed I Assessed the need for Labs I Assessed the need for Foley I Assessed the need for Central Venous Line Family Discussion when available I Assessed the need for Mobilization I made an Assessment of medications to be adjusted accordingly Safety Risk assessment completed  CASE DISCUSSED IN [place] ICU TEAM  Critical Care Time devoted to patient care services described in this note is 50 minutes.  Critical care was necessary to treat or prevent imminent or  life-threatening deterioration.   PATIENT WITH VERY POOR PROGNOSIS I ANTICIPATE PROLONGED ICU LOS  Patient with Multiorgan failure and at high risk for cardiac arrest and death.    Corrin Parker, M.D.  [facility]  Medical Director [place] Director [facility] Cardio-Pulmonary Department

## 2020-11-30 NOTE — Consult Note (Signed)
PHARMACY CONSULT NOTE  Pharmacy Consult for Electrolyte Monitoring and Replacement   Recent Labs: Potassium (mmol/L)  Date Value  11/30/2020 4.1   Magnesium (mg/dL)  Date Value  22/63/3354 2.2   Calcium (mg/dL)  Date Value  56/25/6389 8.0 (L)   Albumin (g/dL)  Date Value  37/34/2876 2.0 (L)   Phosphorus (mg/dL)  Date Value  81/15/7262 3.2   Sodium (mmol/L)  Date Value  11/30/2020 141   Assessment: Patient is a 55 y/o M with medical history including EtOH abuse who presented to the ED 9/19 with chief complaint of SOB, dark colored sputum, back pain and thumb pain. While in the ED, patient began experiencing hallucinations and encephalopathy. Patient ultimately required intubation 9/19. Patient is now admitted to the ICU where he remains intubated, sedated, and on mechanical ventilation. Pharmacy consulted to assist with electrolyte monitoring and replacement as indicated.  Nutrition: Tube feeds started 9/20  Goal of Therapy:  Electrolytes within normal limits  Plan:  --No electrolyte replacement warranted at this time --Continue to follow  Pricilla Riffle, PharmD, BCPS Clinical Pharmacist 11/30/2020 11:35 AM

## 2020-11-30 NOTE — Procedures (Signed)
PROCEDURE SUMMARY:  Successful US guided left thoracentesis. Yielded 1200 mL of clear yellow fluid. Pt tolerated procedure well. No immediate complications.  Specimen was sent for labs. CXR ordered.  EBL < 5 mL  Cloretta Ned 11/30/2020 4:25 PM

## 2020-11-30 NOTE — Plan of Care (Signed)

## 2020-11-30 NOTE — Progress Notes (Signed)
  The Risks and Benefits of the Bronchoscopy procedure with LAVAGE  was explained to family   I have discussed the risk for Acute Bleeding, increased chance of Infection, increased chance of Respiratory Failure and Cardiac Arrest, increased chance of pneumothorax and collapsed lung, as well as increased Stroke and Death.  The procedure consists of a video camera with a light source to be placed and inserted  into the lungs to  look for abnormal tissue and to obtain tissue samples by using lavages  The family understand the risks and benefits and have agreed to proceed with procedure.   GOALS OF CARE DISCUSSION  The Clinical status was relayed to family in detail.  Updated and notified of patients medical condition. CXR shows LEFT sided WHITE OUT Plan for [facility] Plan for [place] assessment for Thoracentesis   Patient remains unresponsive and will not open eyes to command.   Patient is having a weak cough and struggling to remove secretions.   Patient with increased WOB and using accessory muscles to breathe Explained to family course of therapy and the modalities    Patient with Progressive multiorgan failure with a very high probablity of a very minimal chance of meaningful recovery despite all aggressive and optimal medical therapy.  PATIENT REMAINS FULL CODE   Family are satisfied with Plan of action and management. All questions answered  Additional CC time 35 mins   Mata Rowen Santiago Glad, M.D.  [facility]  Medical Director [facility] Medical Director [facility] Cardio-Pulmonary Department

## 2020-12-01 DIAGNOSIS — J9621 Acute and chronic respiratory failure with hypoxia: Secondary | ICD-10-CM

## 2020-12-01 LAB — CBC WITH DIFFERENTIAL/PLATELET
Abs Immature Granulocytes: 0.09 10*3/uL — ABNORMAL HIGH (ref 0.00–0.07)
Abs Immature Granulocytes: 0.08 10*3/uL — ABNORMAL HIGH (ref 0.00–0.07)
Basophils Absolute: 0 10*3/uL (ref 0.0–0.1)
Basophils Absolute: 0 10*3/uL (ref 0.0–0.1)
Basophils Relative: 0 %
Basophils Relative: 0 %
Eosinophils Absolute: 0 10*3/uL (ref 0.0–0.5)
Eosinophils Absolute: 0 10*3/uL (ref 0.0–0.5)
Eosinophils Relative: 0 %
Eosinophils Relative: 0 %
HCT: 27.9 % — ABNORMAL LOW (ref 39.0–52.0)
HCT: 33.3 % — ABNORMAL LOW (ref 39.0–52.0)
Hemoglobin: 9.5 g/dL — ABNORMAL LOW (ref 13.0–17.0)
Hemoglobin: 11 g/dL — ABNORMAL LOW (ref 13.0–17.0)
Immature Granulocytes: 1 %
Immature Granulocytes: 1 %
Lymphocytes Relative: 20 %
Lymphocytes Relative: 22 %
Lymphs Abs: 1.7 10*3/uL (ref 0.7–4.0)
Lymphs Abs: 2.1 10*3/uL (ref 0.7–4.0)
MCH: 36 pg — ABNORMAL HIGH (ref 26.0–34.0)
MCH: 34.8 pg — ABNORMAL HIGH (ref 26.0–34.0)
MCHC: 34.1 g/dL (ref 30.0–36.0)
MCHC: 33 g/dL (ref 30.0–36.0)
MCV: 105.7 fL — ABNORMAL HIGH (ref 80.0–100.0)
MCV: 105.4 fL — ABNORMAL HIGH (ref 80.0–100.0)
Monocytes Absolute: 0.6 10*3/uL (ref 0.1–1.0)
Monocytes Absolute: 0.7 10*3/uL (ref 0.1–1.0)
Monocytes Relative: 7 %
Monocytes Relative: 7 %
Neutro Abs: 6.1 10*3/uL (ref 1.7–7.7)
Neutro Abs: 6.8 10*3/uL (ref 1.7–7.7)
Neutrophils Relative %: 72 %
Neutrophils Relative %: 70 %
Platelets: 357 10*3/uL (ref 150–400)
Platelets: 446 10*3/uL — ABNORMAL HIGH (ref 150–400)
RBC: 2.64 MIL/uL — ABNORMAL LOW (ref 4.22–5.81)
RBC: 3.16 MIL/uL — ABNORMAL LOW (ref 4.22–5.81)
RDW: 12.7 % (ref 11.5–15.5)
RDW: 12.7 % (ref 11.5–15.5)
WBC: 8.5 10*3/uL (ref 4.0–10.5)
WBC: 9.7 10*3/uL (ref 4.0–10.5)
nRBC: 0 % (ref 0.0–0.2)
nRBC: 0 % (ref 0.0–0.2)

## 2020-12-01 LAB — BASIC METABOLIC PANEL
Anion gap: 17 — ABNORMAL HIGH (ref 5–15)
Anion gap: 8 (ref 5–15)
BUN: 20 mg/dL (ref 6–20)
BUN: 26 mg/dL — ABNORMAL HIGH (ref 6–20)
CO2: 28 mmol/L (ref 22–32)
CO2: 32 mmol/L (ref 22–32)
Calcium: 6.5 mg/dL — ABNORMAL LOW (ref 8.9–10.3)
Calcium: 8.2 mg/dL — ABNORMAL LOW (ref 8.9–10.3)
Chloride: 106 mmol/L (ref 98–111)
Chloride: 101 mmol/L (ref 98–111)
Creatinine, Ser: 0.84 mg/dL (ref 0.61–1.24)
Creatinine, Ser: 0.67 mg/dL (ref 0.61–1.24)
GFR, Estimated: >60 mL/min (ref >60)
GFR, Estimated: >60 mL/min (ref >60)
Glucose, Bld: 98 mg/dL (ref 70–99)
Glucose, Bld: 133 mg/dL — ABNORMAL HIGH (ref 70–99)
Potassium: 3.4 mmol/L — ABNORMAL LOW (ref 3.5–5.1)
Potassium: 4.1 mmol/L (ref 3.5–5.1)
Sodium: 151 mmol/L — ABNORMAL HIGH (ref 135–145)
Sodium: 141 mmol/L (ref 135–145)

## 2020-12-01 LAB — GLUCOSE, CAPILLARY
Glucose-Capillary: 107 mg/dL — ABNORMAL HIGH (ref 70–99)
Glucose-Capillary: 115 mg/dL — ABNORMAL HIGH (ref 70–99)
Glucose-Capillary: 115 mg/dL — ABNORMAL HIGH (ref 70–99)
Glucose-Capillary: 109 mg/dL — ABNORMAL HIGH (ref 70–99)
Glucose-Capillary: 117 mg/dL — ABNORMAL HIGH (ref 70–99)
Glucose-Capillary: 97 mg/dL (ref 70–99)
Glucose-Capillary: 119 mg/dL — ABNORMAL HIGH (ref 70–99)

## 2020-12-01 LAB — MAGNESIUM
Magnesium: 2 mg/dL (ref 1.7–2.4)
Magnesium: 2.2 mg/dL (ref 1.7–2.4)

## 2020-12-01 LAB — PHOSPHORUS
Phosphorus: 3.5 mg/dL (ref 2.5–4.6)
Phosphorus: 3.4 mg/dL (ref 2.5–4.6)

## 2020-12-01 LAB — PROCALCITONIN
Procalcitonin: 0.19 ng/mL
Procalcitonin: 0.28 ng/mL

## 2020-12-01 MED ORDER — HYDROMORPHONE HCL 1 MG/ML IJ SOLN
1.0000 mg | Freq: Once | INTRAMUSCULAR | Status: AC
Start: 2020-12-01 — End: 2020-12-01
  Administered 2020-12-01: 1 mg via INTRAVENOUS
  Filled 2020-12-01: qty 1

## 2020-12-01 MED ORDER — SODIUM CHLORIDE 0.9 % IV SOLN
0.5000 mg/h | INTRAVENOUS | Status: DC
  Administered 2020-12-01: 0.5 mg/h via INTRAVENOUS
  Administered 2020-12-02 – 2020-12-03 (×2): 2 mg/h via INTRAVENOUS
  Administered 2020-12-04: 4 mg/h via INTRAVENOUS
  Filled 2020-12-01 (×4): qty 5

## 2020-12-01 MED ORDER — FENTANYL 2500MCG IN NS 250ML (10MCG/ML) PREMIX INFUSION
0.0000 ug/h | INTRAVENOUS | Status: DC
  Administered 2020-12-01: 400 ug/h via INTRAVENOUS

## 2020-12-01 MED ORDER — HYDROMORPHONE BOLUS VIA INFUSION
0.2500 mg | INTRAVENOUS | Status: DC | PRN
  Filled 2020-12-01: qty 2

## 2020-12-01 MED ORDER — FENTANYL 2500MCG IN NS 250ML (10MCG/ML) PREMIX INFUSION
INTRAVENOUS | Status: AC
  Filled 2020-12-01: qty 250

## 2020-12-01 NOTE — Consult Note (Signed)
PHARMACY CONSULT NOTE  Pharmacy Consult for Electrolyte Monitoring and Replacement   Recent Labs: Potassium (mmol/L)  Date Value  12/01/2020 4.1   Magnesium (mg/dL)  Date Value  34/74/2595 2.2   Calcium (mg/dL)  Date Value  63/87/5643 8.2 (L)   Albumin (g/dL)  Date Value  32/95/1884 2.0 (L)   Phosphorus (mg/dL)  Date Value  16/60/6301 3.4   Sodium (mmol/L)  Date Value  12/01/2020 141   Assessment: Patient is a 55 y/o M with medical history including EtOH abuse who presented to the ED 9/19 with chief complaint of SOB, dark colored sputum, back pain and thumb pain. While in the ED, patient began experiencing hallucinations and encephalopathy. Patient ultimately required intubation 9/19. Patient is now admitted to the ICU where he remains intubated, sedated, and on mechanical ventilation. Pharmacy consulted to assist with electrolyte monitoring and replacement as indicated.  Nutrition: Tube feeds started 9/20  Goal of Therapy:  Electrolytes within normal limits  Plan:  --No electrolyte replacement warranted at this time --Continue to follow  Pricilla Riffle, PharmD, BCPS Clinical Pharmacist 12/01/2020 10:40 AM

## 2020-12-01 NOTE — Progress Notes (Signed)
NAME:  Seth Harper, MRN:  [id number], DOB:  15-Oct-1965, LOS: 89 ADMISSION DATE:  11/21/2020  History of Present Illness:  Seth Harper is a 55 year old male with history of alcohol abuse/dependence who presented to the ER on 9/19 with progressive shortness of breath, dark colored sputum, back pain and thumb pain. He started to have hallucinations with increasing altered mentation while in the ER. Further history obtained by the ER nursing staff from the patient's significant other who reports the patient fell last Friday and lost consciousness. She says he has not slept in days, usually drinks 1.5 gallons of rum per week and his last drink was on Friday prior to the fall. Due to his increasing agitation and altered mentation he was sedated and intubated in the ER. PCCM has been called to admit the patient.    Pertinent  Medical History  EtOH abuse/dependence   Significant Hospital Events: Including procedures, antibiotic start and stop dates in addition to other pertinent events   9/19 Intubated, admitted to the ICU.  LP unsuccessful in the ED.  CT MRI head and EEG negative 9/20 changed to PCV due to vent dyssynchrony, lactic acid improved 9/22: Persistent fevers, recollect Tracheal aspirate, Doppler US BLE, consult ID.  KUB concerning for ileus vs SBO 9/23: Ileus resolved, to go for Fluro guided LP and MRI Brain w/ contrast, ABX changed to Meropenem, Acyclovir added  9/24: Uneventful night, started Precedex to wean off propofol and Versed 9/25: Agitated off midazolam, on Precedex, had to start some propofol.  Initiated through [facility]. 9/26 severe agitation, MRI BRAIN NEG, EEG WNL,  9/27 severe hypoxia, remains on vent 9/28 s/p bronch thick secretions, s/p US thoracentesis 1.2 L removed 9/29 lasix given 4.2 L of urine     Interim History / Subjective:  Remains on vent Severe hypoxia Severe agitation    Antimicrobials:   Antibiotics Given (last 72 hours)     Date/Time Action  Medication Dose Rate   11/28/20 0747 New Bag/Given   acyclovir (ZOVIRAX) 955 mg in dextrose 5 % 150 mL IVPB 955 mg 169.1 mL/hr   11/28/20 1437 New Bag/Given   meropenem (MERREM) 2 g in sodium chloride 0.9 % 100 mL IVPB 2 g 200 mL/hr   11/28/20 1813 New Bag/Given   acyclovir (ZOVIRAX) 955 mg in dextrose 5 % 150 mL IVPB 955 mg 169.1 mL/hr   11/28/20 2126 New Bag/Given   meropenem (MERREM) 2 g in sodium chloride 0.9 % 100 mL IVPB 2 g 200 mL/hr   11/28/20 2308 New Bag/Given   acyclovir (ZOVIRAX) 955 mg in dextrose 5 % 150 mL IVPB 955 mg 169.1 mL/hr   11/29/20 0527 New Bag/Given   meropenem (MERREM) 2 g in sodium chloride 0.9 % 100 mL IVPB 2 g 200 mL/hr   11/29/20 0830 New Bag/Given   acyclovir (ZOVIRAX) 955 mg in dextrose 5 % 150 mL IVPB 955 mg 169.1 mL/hr   11/29/20 1406 New Bag/Given   meropenem (MERREM) 2 g in sodium chloride 0.9 % 100 mL IVPB 2 g 200 mL/hr   11/29/20 2150 New Bag/Given   Ampicillin-Sulbactam (UNASYN) 3 g in sodium chloride 0.9 % 100 mL IVPB 3 g 200 mL/hr   11/30/20 3154 New Bag/Given   Ampicillin-Sulbactam (UNASYN) 3 g in sodium chloride 0.9 % 100 mL IVPB 3 g 200 mL/hr   11/30/20 0086 New Bag/Given   Ampicillin-Sulbactam (UNASYN) 3 g in sodium chloride 0.9 % 100 mL IVPB 3 g 200 mL/hr  11/30/20 1637 New Bag/Given   Ampicillin-Sulbactam (UNASYN) 3 g in sodium chloride 0.9 % 100 mL IVPB 3 g 200 mL/hr   11/30/20 2124 New Bag/Given   Ampicillin-Sulbactam (UNASYN) 3 g in sodium chloride 0.9 % 100 mL IVPB 3 g 200 mL/hr   12/01/20 0336 New Bag/Given   Ampicillin-Sulbactam (UNASYN) 3 g in sodium chloride 0.9 % 100 mL IVPB 3 g 200 mL/hr             Objective   Blood pressure 104/70, pulse 81, temperature 98.1 F (36.7 C), resp. rate 20, height $RemoveBe'6\' 3"'txVaEBsQM$  (1.905 m), weight 131 kg, SpO2 96 %.    Vent Mode: PCV FiO2 (%):  [40 %-80 %] 40 % Set Rate:  [20 bmp] 20 bmp PEEP:  [5 cmH20-8 cmH20] 8 cmH20 Plateau Pressure:  [21 cmH20] 21 cmH20   Intake/Output Summary (Last 24  hours) at 12/01/2020 0721 Last data filed at 12/01/2020 0700 Gross per 24 hour  Intake 2862.71 ml  Output 7400 ml  Net -4537.29 ml    Filed Weights   11/29/20 0428 11/30/20 0406 12/01/20 0500  Weight: (!) 137.5 kg 135.7 kg 131 kg   REVIEW OF SYSTEMS  PATIENT IS UNABLE TO PROVIDE COMPLETE REVIEW OF SYSTEMS DUE TO SEVERE CRITICAL ILLNESS AND TOXIC METABOLIC ENCEPHALOPATHY  PHYSICAL EXAMINATION:  GENERAL:critically ill appearing, +resp distress EYES: Pupils equal, round, reactive to light.  No scleral icterus.  MOUTH: Moist mucosal membrane. INTUBATED NECK: Supple.  PULMONARY: +rhonchi, +wheezing CARDIOVASCULAR: S1 and S2.  No murmurs  GASTROINTESTINAL: Soft, nontender, -distended. Positive bowel sounds.  MUSCULOSKELETAL: No swelling, clubbing, or edema.  NEUROLOGIC: obtunded SKIN:intact,warm,dry     Labs/imaging that I havepersonally reviewed  (right click and "Reselect all SmartList Selections" daily)     ASSESSMENT AND PLAN SYNOPSIS  55 yo obese white male with acute and severe resp failure on MV support with toxic metabolic encephalopathy from ETOH withdrawal with ileus  Severe ACUTE Hypoxic and Hypercapnic Respiratory Failure -continue Mechanical Ventilator support -continue Bronchodilator Therapy -Wean Fio2 and PEEP as tolerated -VAP/VENT bundle implementation -will perform SAT/SBT when respiratory parameters are met  Vent Mode: PCV FiO2 (%):  [40 %-80 %] 40 % Set Rate:  [20 bmp] 20 bmp PEEP:  [5 cmH20-8 cmH20] 8 cmH20 Plateau Pressure:  [21 cmH20] 21 cmH20  SEVERE COPD EXACERBATION -continue IV steroids as prescribed -continue NEB THERAPY as prescribed -morphine as needed -wean fio2 as needed and tolerated Acute Hypoxemic Respiratory Failure due to Community Acquired Pneumonia Emphysema Consider BRonchoscopy    NEUROLOGY ACUTE TOXIC METABOLIC ENCEPHALOPATHY -need for sedation -Goal RASS -2 to -3  SEVERE ALCOHOL WITHDRAWAL -Therapy with Thiamine  and MVI -CIWA Protocol    CARDIAC ICU monitoring  Severe SEPSIS SOURCE-CAP -use vasopressors to keep MAP>65 as needed -follow ABG and LA as needed -follow up cultures -emperic ABX  INFECTIOUS DISEASE -continue antibiotics as prescribed -follow up cultures -follow up ID consultation  ENDO - ICU hypoglycemic\Hyperglycemia protocol -check FSBS per protocol   GI GI PROPHYLAXIS as indicated  NUTRITIONAL STATUS DIET-->TF's as tolerated Constipation protocol as indicated   ELECTROLYTES -follow labs as needed -replace as needed -pharmacy consultation and following     Best Practice (right click and "Reselect all SmartList Selections" daily)    Diet/type: NPO, tube feeds DVT prophylaxis: prophylactic heparin  GI prophylaxis: PPI Lines: N/A Foley:  yes, and is still needed Code Status:  full code ICU STATUS     Labs   CBC: Recent Labs  Lab 11/26/20  4008 11/27/20 6761 11/28/20 0459 11/30/20 0401 12/01/20 0339 12/01/20 0441  WBC 12.7* 10.2 9.4 11.5* 8.5 9.7  NEUTROABS 9.1* 7.3  --  8.1* 6.1 6.8  HGB 13.3 13.5 13.5 11.7* 9.5* 11.0*  HCT 39.5 40.4 40.7 34.6* 27.9* 33.3*  MCV 107.0* 107.4* 107.1* 105.5* 105.7* 105.4*  PLT 225 274 315 367 357 446*     Basic Metabolic Panel: Recent Labs  Lab 11/27/20 0614 11/28/20 0459 11/29/20 0342 11/30/20 0401 12/01/20 0339 12/01/20 0441  NA 147* 144 141 141 151* 141  K 4.1 4.0 4.1 4.1 3.4* 4.1  CL 108 108 106 105 106 101  CO2 $Re'28 29 28 30 28 'ugI$ 32  GLUCOSE 109* 97 115* 127* 98 133*  BUN 24* 17 19 21* 20 26*  CREATININE 0.67 0.63 0.81 0.65 0.84 0.67  CALCIUM 8.1* 8.1* 7.9* 8.0* 6.5* 8.2*  MG 2.4 2.3 2.5* 2.2 2.0 2.2  PHOS 3.0 3.8  --  3.2 3.5 3.4    GFR: Estimated Creatinine Clearance: 155.7 mL/min (by C-G formula based on SCr of 0.67 mg/dL). Recent Labs  Lab 11/24/20 1434 11/24/20 1726 11/25/20 0542 11/26/20 0437 11/28/20 0459 11/29/20 0342 11/30/20 0401 12/01/20 0339 12/01/20 0441  PROCALCITON   --   --  2.63   < > 0.72 0.92 0.46 0.19 0.28  WBC  --   --  12.9*   < > 9.4  --  11.5* 8.5 9.7  LATICACIDVEN 0.9 1.0 2.0*  --   --   --   --   --   --    < > = values in this interval not displayed.     Liver Function Tests: Recent Labs  Lab 11/26/20 0437  AST 41  ALT 32  ALKPHOS 102  BILITOT 0.9  PROT 6.0*  ALBUMIN 2.0*    No results for input(s): LIPASE, AMYLASE in the last 168 hours. No results for input(s): AMMONIA in the last 168 hours.  ABG    Component Value Date/Time   PHART 7.27 (L) 11/28/2020 1349   PCO2ART 70 ([place]) 11/28/2020 1349   PO2ART 79 (L) 11/28/2020 1349   HCO3 31.5 (H) 11/28/2020 1349   ACIDBASEDEF 0.7 11/22/2020 0353   O2SAT 94.6 11/28/2020 1349       DVT/GI PRX  assessed I Assessed the need for Labs I Assessed the need for Foley I Assessed the need for Central Venous Line Family Discussion when available I Assessed the need for Mobilization I made an Assessment of medications to be adjusted accordingly Safety Risk assessment completed  CASE DISCUSSED IN MULTIDISCIPLINARY ROUNDS WITH ICU TEAM     Critical Care Time devoted to patient care services described in this note is 50 minutes.  Critical care was necessary to treat /prevent imminent and life-threatening deterioration. Overall, patient is critically ill, prognosis is guarded.  Patient with Multiorgan failure and at high risk for cardiac arrest and death.    Corrin Parker, M.D.  [facility]  Medical Director [place] Director [facility] Cardio-Pulmonary Department

## 2020-12-01 NOTE — Progress Notes (Signed)
ID Remains intubated Off sedation, so awake , but agitated Does not follow commands No fever in 2 days Had 1.2 liters of pleural fluid taken out on 11/30/20    BP 123/80 (BP Location: Left Arm)   Pulse 85   Temp 98.2 F (36.8 C)   Resp 20   Ht _0  (1.905 m)   Wt 131 kg   SpO2 97%   BMI 36.10 kg/m    NG tube ETT Chest b/l air entry HS irregular Rt arm PICC Foley catheter CNS cannot be assessed  Labs CBC Latest Ref Rng & Units 12/01/2020 12/01/2020 11/30/2020  WBC 4.0 - 10.5 K/uL 9.7 8.5 11.5(H)  Hemoglobin 13.0 - 17.0 g/dL 11.0(L) 9.5(L) 11.7(L)  Hematocrit 39.0 - 52.0 % 33.3(L) 27.9(L) 34.6(L)  Platelets 150 - 400 K/uL 446(H) 357 367     CMP Latest Ref Rng & Units 12/01/2020 12/01/2020 11/30/2020  Glucose 70 - 99 mg/dL 133(H) 98 127(H)  BUN 6 - 20 mg/dL 26(H) 20 21(H)  Creatinine 0.61 - 1.24 mg/dL 0.67 0.84 0.65  Sodium 135 - 145 mmol/L 141 151(H) 141  Potassium 3.5 - 5.1 mmol/L 4.1 3.4(L) 4.1  Chloride 98 - 111 mmol/L 101 106 105  CO2 22 - 32 mmol/L 32 28 30  Calcium 8.9 - 10.3 mg/dL 8.2(L) 6.5(L) 8.0(L)  Total Protein 6.5 - 8.1 g/dL - - -  Total Bilirubin 0.3 - 1.2 mg/dL - - -  Alkaline Phos 38 - 126 U/L - - -  AST 15 - 41 U/L - - -  ALT 0 - 44 U/L - - -    Micro Microbiology: 11/27/20 - Resp panel PCR Neg 11/25/20 CSF culture NG 9/22 resp culture NG 11/21/20- BC NG CSF WBC 18-27 Protein 181 9/26 BC 9/28 BAL culture 9/28 Pleural fluid culture Pleural fluid- 4561 WBC -67% neutrophils LD 228 Albumin < 1.5  Imaging  Post thoracentesis    Impression/recommendation  Acute encephalopathy  due to alcohol withdrawal/DTs   Acute hypoxic resp failure- intubated Left lung white out- underwent bronch and suctioning of secretions and thoracentesis of left pleural space on 11/30/20 Culture  sent - gram stain gram positive cocci  Aspiration pneumonia- on unasyn currently after 5 days of meropenem  Fever- afebrile in  > 48 hrs Thought to be due to central  in origin from Dts  Before 9/28- all cultures were negative  Follow pleural fluid culture  Discussed the management with his nurse

## 2020-12-02 LAB — BASIC METABOLIC PANEL
Anion gap: 4 — ABNORMAL LOW (ref 5–15)
BUN: 26 mg/dL — ABNORMAL HIGH (ref 6–20)
CO2: 35 mmol/L — ABNORMAL HIGH (ref 22–32)
Calcium: 8.1 mg/dL — ABNORMAL LOW (ref 8.9–10.3)
Chloride: 100 mmol/L (ref 98–111)
Creatinine, Ser: 0.67 mg/dL (ref 0.61–1.24)
GFR, Estimated: >60 mL/min (ref >60)
Glucose, Bld: 100 mg/dL — ABNORMAL HIGH (ref 70–99)
Potassium: 4.1 mmol/L (ref 3.5–5.1)
Sodium: 139 mmol/L (ref 135–145)

## 2020-12-02 LAB — GLUCOSE, CAPILLARY
Glucose-Capillary: 95 mg/dL (ref 70–99)
Glucose-Capillary: 83 mg/dL (ref 70–99)
Glucose-Capillary: 157 mg/dL — ABNORMAL HIGH (ref 70–99)
Glucose-Capillary: 133 mg/dL — ABNORMAL HIGH (ref 70–99)
Glucose-Capillary: 89 mg/dL (ref 70–99)

## 2020-12-02 LAB — PROTIME-INR
INR: 1.1 (ref 0.8–1.2)
Prothrombin Time: 13.7 seconds (ref 11.4–15.2)

## 2020-12-02 LAB — COMP PANEL: LEUKEMIA/LYMPHOMA

## 2020-12-02 LAB — CYTOLOGY - NON PAP

## 2020-12-02 LAB — PHOSPHORUS: Phosphorus: 3.4 mg/dL (ref 2.5–4.6)

## 2020-12-02 LAB — PROCALCITONIN: Procalcitonin: 0.18 ng/mL

## 2020-12-02 LAB — MAGNESIUM: Magnesium: 2.1 mg/dL (ref 1.7–2.4)

## 2020-12-02 MED ORDER — PROPOFOL 1000 MG/100ML IV EMUL
5.0000 ug/kg/min | INTRAVENOUS | Status: DC
  Administered 2020-12-02: 20 ug/kg/min via INTRAVENOUS
  Administered 2020-12-02: 10 ug/kg/min via INTRAVENOUS
  Administered 2020-12-03 (×2): 30 ug/kg/min via INTRAVENOUS
  Administered 2020-12-03: 20 ug/kg/min via INTRAVENOUS
  Administered 2020-12-03: 30 ug/kg/min via INTRAVENOUS
  Administered 2020-12-03: 35 ug/kg/min via INTRAVENOUS
  Administered 2020-12-03: 20 ug/kg/min via INTRAVENOUS
  Administered 2020-12-04 (×2): 30 ug/kg/min via INTRAVENOUS
  Filled 2020-12-02 (×11): qty 100

## 2020-12-02 NOTE — Consult Note (Signed)
Jarreau, [place] [id number] 1965/06/10 Erin Fulling, MD  Reason for Consult: Tracheostomy tube placement  HPI: Patient intubated and sedated history noted and read in the chart.  Prolonged intubation for likely alcohol withdrawal and failure to extubate.  Allergies: No Known Allergies  ROS: Review of systems normal other than 12 systems except per HPI.  PMH:  Past Medical History:  Diagnosis Date   Asthma     FH: No family history on file.  SH:  Social History   Socioeconomic History   Marital status: Single    Spouse name: Not on file   Number of children: Not on file   Years of education: Not on file   Highest education level: Not on file  Occupational History   Not on file  Tobacco Use   Smoking status: Every Day    Types: E-cigarettes   Smokeless tobacco: Never  Substance and Sexual Activity   Alcohol use: Yes   Drug use: Yes    Types: Marijuana   Sexual activity: Not on file  Other Topics Concern   Not on file  Social History Narrative   Not on file   Social Determinants of Health   Financial Resource Strain: Not on file  Food Insecurity: Not on file  Transportation Needs: Not on file  Physical Activity: Not on file  Stress: Not on file  Social Connections: Not on file  Intimate Partner Violence: Not on file    PSH: History reviewed. No pertinent surgical history.  Physical  Exam: Patient is intubated and sedated unable to answer questions.  The anterior nose appears clear the ears appear normal patient was intubated orally.  He has a long full beard, his anterior neck appeared normal no evidence of anterior scarring noted.  A/P: Patient has been now been intubated approximately 11 days, ICU team does not feel that he is able to be extubated safely.  Agree with plan to proceed with tracheostomy tube placement.  In speaking with nursing staff and with Dr. Belia Heman, there is a question about who is his power of attorney.  We will proceed with scheduling at the  earliest possible date for tracheostomy tube placement, however we would not will need to work out legally who is his power of attorney to give [place] permission to proceed with the surgical intervention.  I will let ICU team know when a date has been chosen for the procedure.  ICU time approximately 45 minutes   Davina Poke 12/02/2020 5:37 PM

## 2020-12-02 NOTE — Progress Notes (Signed)
NAME:  Seth Harper, MRN:  [id number], DOB:  12/14/1965, LOS: 11 ADMISSION DATE:  11/21/2020  History of Present Illness:  Seth Harper is a 55 year old male with history of alcohol abuse/dependence who presented to the ER on 9/19 with progressive shortness of breath, dark colored sputum, back pain and thumb pain. He started to have hallucinations with increasing altered mentation while in the ER. Further history obtained by the ER nursing staff from the patient's significant other who reports the patient fell last Friday and lost consciousness. She says he has not slept in days, usually drinks 1.5 gallons of rum per week and his last drink was on Friday prior to the fall. Due to his increasing agitation and altered mentation he was sedated and intubated in the ER. PCCM has been called to admit the patient.    Pertinent  Medical History  EtOH abuse/dependence   Significant Hospital Events: Including procedures, antibiotic start and stop dates in addition to other pertinent events   9/19 Intubated, admitted to the ICU.  LP unsuccessful in the ED.  CT MRI head and EEG negative 9/20 changed to PCV due to vent dyssynchrony, lactic acid improved 9/22: Persistent fevers, recollect Tracheal aspirate, Doppler US BLE, consult ID.  KUB concerning for ileus vs SBO 9/23: Ileus resolved, to go for Fluro guided LP and MRI Brain w/ contrast, ABX changed to Meropenem, Acyclovir added  9/24: Uneventful night, started Precedex to wean off propofol and Versed 9/25: Agitated off midazolam, on Precedex, had to start some propofol.  Initiated through [facility]. 9/26 severe agitation, MRI BRAIN NEG, EEG WNL,  9/27 severe hypoxia, remains on vent 9/28 s/p bronch thick secretions, s/p US thoracentesis 1.2 L removed 9/29 lasix given 4.2 L of urine     Interim History / Subjective:  Remains on vent Severe hypoxia Severe agitation    Antimicrobials:   Antibiotics Given (last 72 hours)     Date/Time Action  Medication Dose Rate   11/29/20 1406 New Bag/Given   meropenem (MERREM) 2 g in sodium chloride 0.9 % 100 mL IVPB 2 g 200 mL/hr   11/29/20 2150 New Bag/Given   Ampicillin-Sulbactam (UNASYN) 3 g in sodium chloride 0.9 % 100 mL IVPB 3 g 200 mL/hr   11/30/20 2387 New Bag/Given   Ampicillin-Sulbactam (UNASYN) 3 g in sodium chloride 0.9 % 100 mL IVPB 3 g 200 mL/hr   11/30/20 0921 New Bag/Given   Ampicillin-Sulbactam (UNASYN) 3 g in sodium chloride 0.9 % 100 mL IVPB 3 g 200 mL/hr   11/30/20 1637 New Bag/Given   Ampicillin-Sulbactam (UNASYN) 3 g in sodium chloride 0.9 % 100 mL IVPB 3 g 200 mL/hr   11/30/20 2124 New Bag/Given   Ampicillin-Sulbactam (UNASYN) 3 g in sodium chloride 0.9 % 100 mL IVPB 3 g 200 mL/hr   12/01/20 0336 New Bag/Given   Ampicillin-Sulbactam (UNASYN) 3 g in sodium chloride 0.9 % 100 mL IVPB 3 g 200 mL/hr   12/01/20 0820 New Bag/Given   Ampicillin-Sulbactam (UNASYN) 3 g in sodium chloride 0.9 % 100 mL IVPB 3 g 200 mL/hr   12/01/20 1618 New Bag/Given   Ampicillin-Sulbactam (UNASYN) 3 g in sodium chloride 0.9 % 100 mL IVPB 3 g 200 mL/hr   12/01/20 2114 New Bag/Given   Ampicillin-Sulbactam (UNASYN) 3 g in sodium chloride 0.9 % 100 mL IVPB 3 g 200 mL/hr   12/02/20 0359 New Bag/Given   Ampicillin-Sulbactam (UNASYN) 3 g in sodium chloride 0.9 % 100 mL IVPB 3 g  200 mL/hr   12/02/20 0941 New Bag/Given   Ampicillin-Sulbactam (UNASYN) 3 g in sodium chloride 0.9 % 100 mL IVPB 3 g 200 mL/hr             Objective   Blood pressure 108/67, pulse 94, temperature 98.6 F (37 C), resp. rate (!) 0, height $RemoveB'6\' 3"'eoScdgGh$  (1.905 m), weight 129.4 kg, SpO2 94 %.    Vent Mode: PRVC FiO2 (%):  [28 %-35 %] 28 % Set Rate:  [20 bmp] 20 bmp Vt Set:  [550 mL] 550 mL PEEP:  [5 cmH20] 5 cmH20 Plateau Pressure:  [16 cmH20] 16 cmH20   Intake/Output Summary (Last 24 hours) at 12/02/2020 1053 Last data filed at 12/02/2020 5397 Gross per 24 hour  Intake 2021.86 ml  Output 3850 ml  Net -1828.14 ml     Filed Weights   11/30/20 0406 12/01/20 0500 12/02/20 0402  Weight: 135.7 kg 131 kg 129.4 kg   REVIEW OF SYSTEMS  PATIENT IS UNABLE TO PROVIDE COMPLETE REVIEW OF SYSTEMS DUE TO SEVERE CRITICAL ILLNESS AND TOXIC METABOLIC ENCEPHALOPATHY  PHYSICAL EXAMINATION:  GENERAL:critically ill appearing, +resp distress EYES: Pupils equal, round, reactive to light.  No scleral icterus.  MOUTH: Moist mucosal membrane. INTUBATED NECK: Supple.  PULMONARY: +rhonchi, +wheezing CARDIOVASCULAR: S1 and S2.  No murmurs  GASTROINTESTINAL: Soft, nontender, -distended. Positive bowel sounds.  MUSCULOSKELETAL: No swelling, clubbing, or edema.  NEUROLOGIC: obtunded SKIN:intact,warm,dry     Labs/imaging that I havepersonally reviewed  (right click and "Reselect all SmartList Selections" daily)     ASSESSMENT AND PLAN SYNOPSIS  55 yo obese white male with acute and severe resp failure on MV support with toxic metabolic encephalopathy from ETOH withdrawal with ileus  Severe ACUTE Hypoxic and Hypercapnic Respiratory Failure -continue Mechanical Ventilator support -continue Bronchodilator Therapy -Wean Fio2 and PEEP as tolerated -VAP/VENT bundle implementation -will perform SAT/SBT when respiratory parameters are met  Vent Mode: PRVC FiO2 (%):  [28 %-35 %] 28 % Set Rate:  [20 bmp] 20 bmp Vt Set:  [550 mL] 550 mL PEEP:  [5 cmH20] 5 cmH20 Plateau Pressure:  [16 cmH20] 16 cmH20  SEVERE COPD EXACERBATION -continue IV steroids as prescribed -continue NEB THERAPY as prescribed -morphine as needed -wean fio2 as needed and tolerated Acute Hypoxemic Respiratory Failure due to Community Acquired Pneumonia Emphysema Consider BRonchoscopy    NEUROLOGY ACUTE TOXIC METABOLIC ENCEPHALOPATHY -need for sedation -Goal RASS -2 to -3  SEVERE ALCOHOL WITHDRAWAL -Therapy with Thiamine and MVI -CIWA Protocol    CARDIAC ICU monitoring  Severe SEPSIS SOURCE-CAP -use vasopressors to keep MAP>65  as needed -follow ABG and LA as needed -follow up cultures -emperic ABX  INFECTIOUS DISEASE -continue antibiotics as prescribed -follow up cultures -follow up ID consultation  ENDO - ICU hypoglycemic\Hyperglycemia protocol -check FSBS per protocol   GI GI PROPHYLAXIS as indicated  NUTRITIONAL STATUS DIET-->TF's as tolerated Constipation protocol as indicated   ELECTROLYTES -follow labs as needed -replace as needed -pharmacy consultation and following     Best Practice (right click and "Reselect all SmartList Selections" daily)    Diet/type: NPO, tube feeds DVT prophylaxis: prophylactic heparin  GI prophylaxis: PPI Lines: N/A Foley:  yes, and is still needed Code Status:  full code ICU STATUS     Labs   CBC: Recent Labs  Lab 11/26/20 0437 11/27/20 0614 11/28/20 0459 11/30/20 0401 12/01/20 0339 12/01/20 0441  WBC 12.7* 10.2 9.4 11.5* 8.5 9.7  NEUTROABS 9.1* 7.3  --  8.1* 6.1 6.8  HGB 13.3  13.5 13.5 11.7* 9.5* 11.0*  HCT 39.5 40.4 40.7 34.6* 27.9* 33.3*  MCV 107.0* 107.4* 107.1* 105.5* 105.7* 105.4*  PLT 225 274 315 367 357 446*     Basic Metabolic Panel: Recent Labs  Lab 11/28/20 0459 11/29/20 0342 11/30/20 0401 12/01/20 0339 12/01/20 0441 12/02/20 0405  NA 144 141 141 151* 141 139  K 4.0 4.1 4.1 3.4* 4.1 4.1  CL 108 106 105 106 101 100  CO2 $Re'29 28 30 28 'DSY$ 32 35*  GLUCOSE 97 115* 127* 98 133* 100*  BUN 17 19 21* 20 26* 26*  CREATININE 0.63 0.81 0.65 0.84 0.67 0.67  CALCIUM 8.1* 7.9* 8.0* 6.5* 8.2* 8.1*  MG 2.3 2.5* 2.2 2.0 2.2 2.1  PHOS 3.8  --  3.2 3.5 3.4 3.4    GFR: Estimated Creatinine Clearance: 154.8 mL/min (by C-G formula based on SCr of 0.67 mg/dL). Recent Labs  Lab 11/28/20 0459 11/29/20 0342 11/30/20 0401 12/01/20 0339 12/01/20 0441 12/02/20 0405  PROCALCITON 0.72   < > 0.46 0.19 0.28 0.18  WBC 9.4  --  11.5* 8.5 9.7  --    < > = values in this interval not displayed.     Liver Function Tests: Recent Labs  Lab  11/26/20 0437  AST 41  ALT 32  ALKPHOS 102  BILITOT 0.9  PROT 6.0*  ALBUMIN 2.0*    No results for input(s): LIPASE, AMYLASE in the last 168 hours. No results for input(s): AMMONIA in the last 168 hours.  ABG    Component Value Date/Time   PHART 7.27 (L) 11/28/2020 1349   PCO2ART 70 ([place]) 11/28/2020 1349   PO2ART 79 (L) 11/28/2020 1349   HCO3 31.5 (H) 11/28/2020 1349   ACIDBASEDEF 0.7 11/22/2020 0353   O2SAT 94.6 11/28/2020 1349       DVT/GI PRX  assessed I Assessed the need for Labs I Assessed the need for Foley I Assessed the need for Central Venous Line Family Discussion when available I Assessed the need for Mobilization I made an Assessment of medications to be adjusted accordingly Safety Risk assessment completed  CASE DISCUSSED IN MULTIDISCIPLINARY ROUNDS WITH ICU TEAM     Critical Care Time devoted to patient care services described in this note is 50 minutes.  Critical care was necessary to treat /prevent imminent and life-threatening deterioration. Overall, patient is critically ill, prognosis is guarded.  Patient with Multiorgan failure and at high risk for cardiac arrest and death.    Corrin Parker, M.D.  [facility]  Medical Director [place] Director [facility] Cardio-Pulmonary Department

## 2020-12-02 NOTE — Consult Note (Signed)
PHARMACY CONSULT NOTE  Pharmacy Consult for Electrolyte Monitoring and Replacement   Recent Labs: Potassium (mmol/L)  Date Value  12/02/2020 4.1   Magnesium (mg/dL)  Date Value  65/53/7482 2.1   Calcium (mg/dL)  Date Value  70/78/6754 8.1 (L)   Albumin (g/dL)  Date Value  49/20/1007 2.0 (L)   Phosphorus (mg/dL)  Date Value  02/21/7587 3.4   Sodium (mmol/L)  Date Value  12/02/2020 139   Assessment: Patient is a 55 y/o M with medical history including EtOH abuse who presented to the ED 9/19 with chief complaint of SOB, dark colored sputum, back pain and thumb pain. While in the ED, patient began experiencing hallucinations and encephalopathy. Patient ultimately required intubation 9/19. Patient is now admitted to the ICU where he remains intubated, sedated, and on mechanical ventilation. Drinks 1.5 gallons of rum per week. Alcohol withdrawal on Versed infusion, Librium and phenobarbital per tube. Pharmacy consulted to assist with electrolyte monitoring and replacement as indicated.  Nutrition: Tube feeds started 9/20  Goal of Therapy:  Electrolytes within normal limits  Plan:  --No electrolyte replacement warranted at this time --Continue to follow  Pricilla Riffle, PharmD, BCPS Clinical Pharmacist 12/02/2020 12:19 PM

## 2020-12-02 NOTE — Progress Notes (Signed)
GOALS OF CARE DISCUSSION  The Clinical status was relayed to family in detail. Seth Standard S.O.  Updated and notified of patients medical condition.    Patient remains unresponsive and will not open eyes to command.   Patient is having a weak cough and struggling to remove secretions.   Patient with increased WOB and using accessory muscles to breathe Explained to family course of therapy and the modalities  Heavy ETOH and Tobacco abuse Failure to wean from vent Needs TRACH, ENT to be consulted   Patient with Progressive multiorgan failure with a very high probablity of a very minimal chance of meaningful recovery despite all aggressive and optimal medical therapy.  PATIENT REMAINS FULL CODE  Family understands the situation.  Family are satisfied with Plan of action and management. All questions answered  Additional CC time 25 mins   Seth Harper, M.D.  [facility]  Medical Director [facility] Medical Director [facility] Cardio-Pulmonary Department

## 2020-12-02 NOTE — Progress Notes (Deleted)
Paged Dr Butler Denmark. Bladder scanned patient 120 ml. Patient has been drinking fluids. Sent secure chat regarding transferring to floor. Awaiting for response.

## 2020-12-03 ENCOUNTER — Inpatient Hospital Stay: Payer: Medicaid Other

## 2020-12-03 DIAGNOSIS — F10931 Alcohol use, unspecified with withdrawal delirium: Secondary | ICD-10-CM

## 2020-12-03 LAB — BLOOD GAS, ARTERIAL
Acid-Base Excess: 15.8 mmol/L — ABNORMAL HIGH (ref 0.0–2.0)
Bicarbonate: 40.2 mmol/L — ABNORMAL HIGH (ref 20.0–28.0)
FIO2: 60
MECHVT: 550 mL
Mechanical Rate: 20
O2 Saturation: 99.1 %
PEEP: 10 cmH2O
Patient temperature: 37
pCO2 arterial: 47 mmHg (ref 32.0–48.0)
pH, Arterial: 7.54 — ABNORMAL HIGH (ref 7.350–7.450)
pO2, Arterial: 122 mmHg — ABNORMAL HIGH (ref 83.0–108.0)

## 2020-12-03 LAB — CULTURE, BLOOD (ROUTINE X 2)
Culture: NO GROWTH
Culture: NO GROWTH
Special Requests: ADEQUATE
Special Requests: ADEQUATE

## 2020-12-03 LAB — BASIC METABOLIC PANEL
Anion gap: 7 (ref 5–15)
BUN: 22 mg/dL — ABNORMAL HIGH (ref 6–20)
CO2: 34 mmol/L — ABNORMAL HIGH (ref 22–32)
Calcium: 8.1 mg/dL — ABNORMAL LOW (ref 8.9–10.3)
Chloride: 97 mmol/L — ABNORMAL LOW (ref 98–111)
Creatinine, Ser: 0.57 mg/dL — ABNORMAL LOW (ref 0.61–1.24)
GFR, Estimated: >60 mL/min (ref >60)
Glucose, Bld: 100 mg/dL — ABNORMAL HIGH (ref 70–99)
Potassium: 3.9 mmol/L (ref 3.5–5.1)
Sodium: 138 mmol/L (ref 135–145)

## 2020-12-03 LAB — CBC WITH DIFFERENTIAL/PLATELET
Abs Immature Granulocytes: 0.07 10*3/uL (ref 0.00–0.07)
Basophils Absolute: 0.1 10*3/uL (ref 0.0–0.1)
Basophils Relative: 1 %
Eosinophils Absolute: 0 10*3/uL (ref 0.0–0.5)
Eosinophils Relative: 0 %
HCT: 33.3 % — ABNORMAL LOW (ref 39.0–52.0)
Hemoglobin: 11.5 g/dL — ABNORMAL LOW (ref 13.0–17.0)
Immature Granulocytes: 1 %
Lymphocytes Relative: 30 %
Lymphs Abs: 3 10*3/uL (ref 0.7–4.0)
MCH: 36.2 pg — ABNORMAL HIGH (ref 26.0–34.0)
MCHC: 34.5 g/dL (ref 30.0–36.0)
MCV: 104.7 fL — ABNORMAL HIGH (ref 80.0–100.0)
Monocytes Absolute: 0.9 10*3/uL (ref 0.1–1.0)
Monocytes Relative: 9 %
Neutro Abs: 6.1 10*3/uL (ref 1.7–7.7)
Neutrophils Relative %: 59 %
Platelets: 474 10*3/uL — ABNORMAL HIGH (ref 150–400)
RBC: 3.18 MIL/uL — ABNORMAL LOW (ref 4.22–5.81)
RDW: 12.3 % (ref 11.5–15.5)
WBC: 10.1 10*3/uL (ref 4.0–10.5)
nRBC: 0 % (ref 0.0–0.2)

## 2020-12-03 LAB — GLUCOSE, CAPILLARY
Glucose-Capillary: 101 mg/dL — ABNORMAL HIGH (ref 70–99)
Glucose-Capillary: 110 mg/dL — ABNORMAL HIGH (ref 70–99)
Glucose-Capillary: 111 mg/dL — ABNORMAL HIGH (ref 70–99)
Glucose-Capillary: 161 mg/dL — ABNORMAL HIGH (ref 70–99)
Glucose-Capillary: 128 mg/dL — ABNORMAL HIGH (ref 70–99)
Glucose-Capillary: 104 mg/dL — ABNORMAL HIGH (ref 70–99)

## 2020-12-03 LAB — PHOSPHORUS: Phosphorus: 3.7 mg/dL (ref 2.5–4.6)

## 2020-12-03 LAB — CULTURE, RESPIRATORY W GRAM STAIN: Culture: NORMAL

## 2020-12-03 LAB — TRIGLYCERIDES: Triglycerides: 275 mg/dL — ABNORMAL HIGH (ref <150)

## 2020-12-03 LAB — PROCALCITONIN: Procalcitonin: 0.17 ng/mL

## 2020-12-03 LAB — MAGNESIUM: Magnesium: 2 mg/dL (ref 1.7–2.4)

## 2020-12-03 IMAGING — DX DG CHEST 1V PORT
1 series · 1 of 1 positions shown · non-contrast
Comparison: [DATE]

CLINICAL DATA: Hypoxia.

EXAM:
PORTABLE CHEST 1 VIEW

[chest ap]
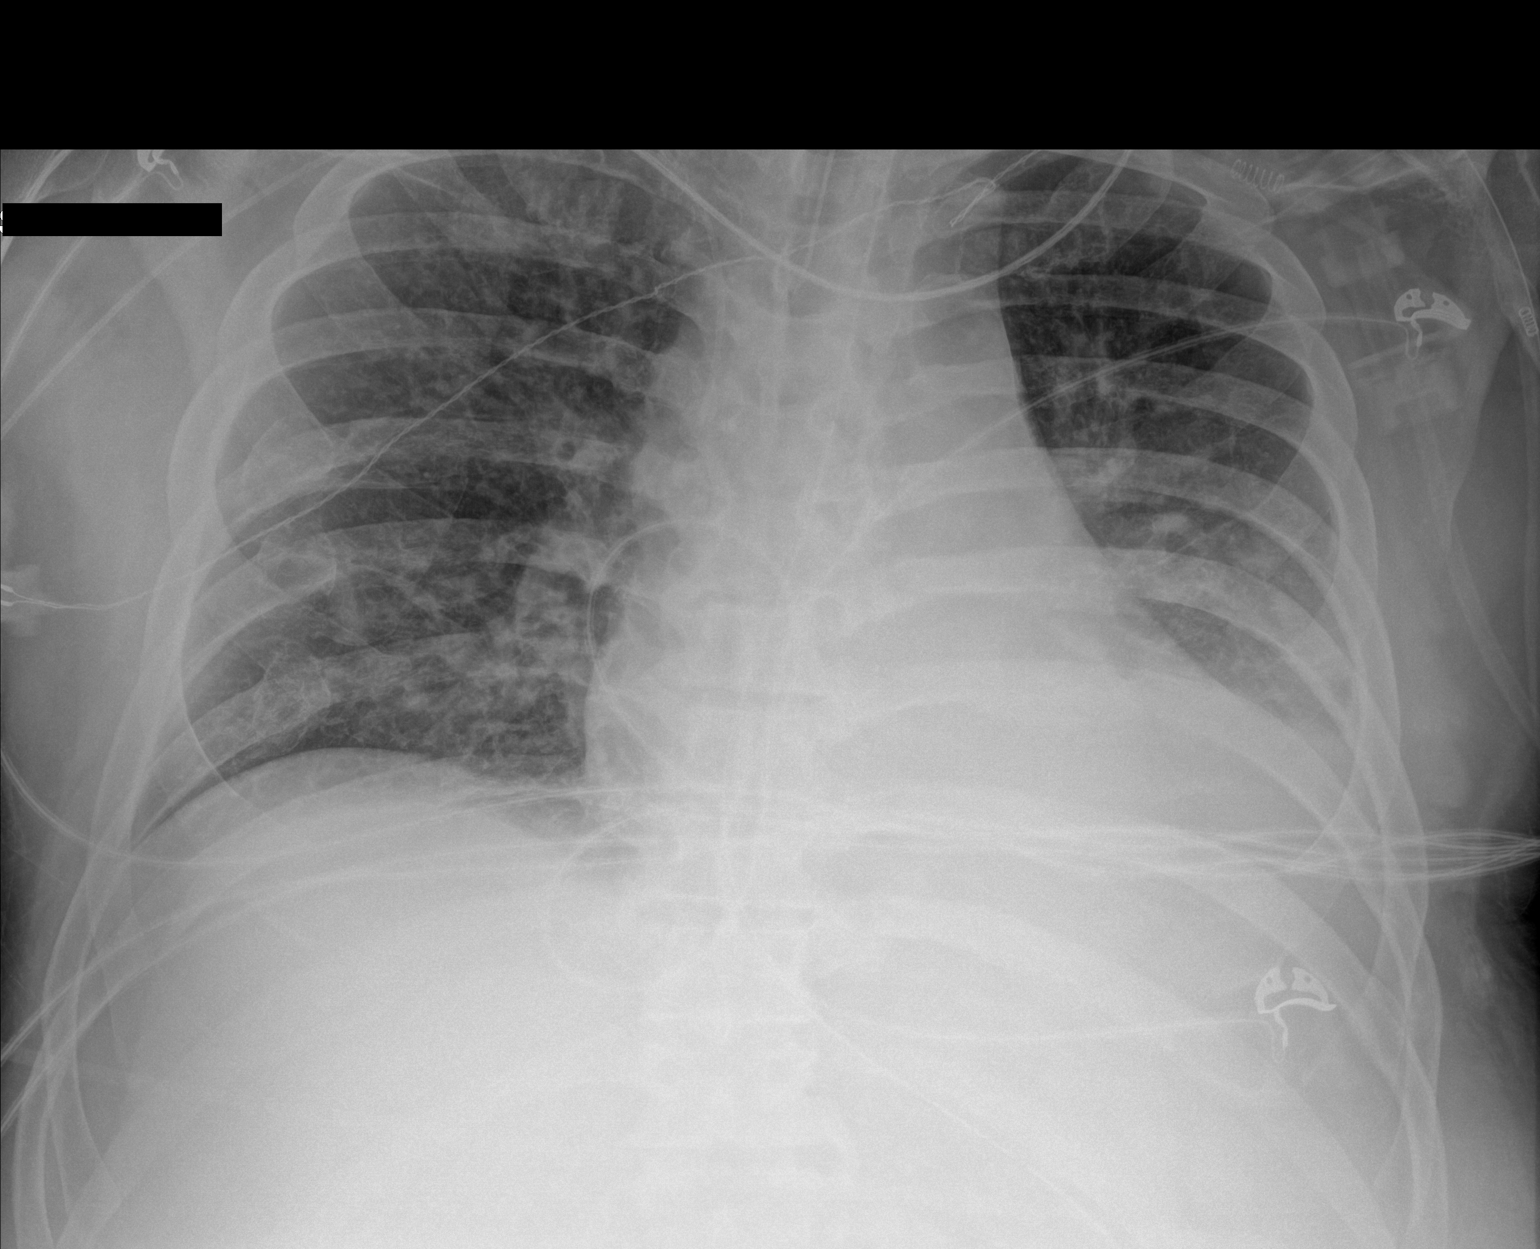

[1 of 1 positions shown; findings below may reference images not displayed]

FINDINGS: The ET tube is in good position. The NG tube terminates below
today's film. A right PICC line terminates in the central SVC. No
pneumothorax. Opacity in the left base obscures the left
hemidiaphragm. There may be an associated small layering effusion.
Mild interstitial prominence is stable. No other acute
abnormalities.
IMPRESSION: 1. Support apparatus as above.
2. Probable small left effusion with underlying atelectasis.
3. Probable mild pulmonary edema.

## 2020-12-03 MED ORDER — MIDAZOLAM HCL 2 MG/2ML IJ SOLN
INTRAMUSCULAR | Status: AC
  Filled 2020-12-03: qty 2

## 2020-12-03 NOTE — Consult Note (Signed)
PHARMACY CONSULT NOTE  Pharmacy Consult for Electrolyte Monitoring and Replacement   Recent Labs: Potassium (mmol/L)  Date Value  12/03/2020 3.9   Magnesium (mg/dL)  Date Value  40/10/6759 2.0   Calcium (mg/dL)  Date Value  95/11/3265 8.1 (L)   Albumin (g/dL)  Date Value  12/45/8099 2.0 (L)   Phosphorus (mg/dL)  Date Value  83/38/2505 3.7   Sodium (mmol/L)  Date Value  12/03/2020 138   Assessment: Patient is a 55 y/o M with medical history including EtOH abuse who presented to the ED 9/19 with chief complaint of SOB, dark colored sputum, back pain and thumb pain. While in the ED, patient began experiencing hallucinations and encephalopathy. Patient ultimately required intubation 9/19. Patient is now admitted to the ICU where he remains intubated, sedated, and on mechanical ventilation. Drinks 1.5 gallons of rum per week. Alcohol withdrawal on Versed infusion, Librium and phenobarbital per tube. Pharmacy consulted to assist with electrolyte monitoring and replacement as indicated.  Nutrition: Tube feeds started 9/20  Goal of Therapy:  Electrolytes within normal limits  Plan:  No electrolyte replacement warranted at this time F/u with AM labs.   Ronnald Ramp, PharmD, BCPS Clinical Pharmacist 12/03/2020 8:19 AM

## 2020-12-03 NOTE — Progress Notes (Signed)
NAME:  Seth Harper, MRN:  [id number], DOB:  [birth year], LOS: 12 ADMISSION DATE:  11/21/2020  History of Present Illness:  Seth Harper is a 55 year old male with history of alcohol abuse/dependence who presented to the ER on 9/19 with progressive shortness of breath, dark colored sputum, back pain and thumb pain. He started to have hallucinations with increasing altered mentation while in the ER. Further history obtained by the ER nursing staff from the patient's significant other who reports the patient fell last Friday and lost consciousness. She says he has not slept in days, usually drinks 1.5 gallons of rum per week and his last drink was on Friday prior to the fall. Due to his increasing agitation and altered mentation he was sedated and intubated in the ER. PCCM has been called to admit the patient.    Pertinent  Medical History  EtOH abuse/dependence   Significant Hospital Events: Including procedures, antibiotic start and stop dates in addition to other pertinent events   9/19 Intubated, admitted to the ICU.  LP unsuccessful in the ED.  CT MRI head and EEG negative 9/20 changed to PCV due to vent dyssynchrony, lactic acid improved 9/22: Persistent fevers, recollect Tracheal aspirate, Doppler US BLE, consult ID.  KUB concerning for ileus vs SBO 9/23: Ileus resolved, to go for Fluro guided LP and MRI Brain w/ contrast, ABX changed to Meropenem, Acyclovir added  9/24: Uneventful night, started Precedex to wean off propofol and Versed 9/25: Agitated off midazolam, on Precedex, had to start some propofol.  Initiated through [facility]. 9/26 severe agitation, MRI BRAIN NEG, EEG WNL,  9/27 severe hypoxia, remains on vent 9/28 s/p bronch thick secretions, s/p US thoracentesis 1.2 L removed 9/29 lasix given 4.2 L of urine 9/30 severe agitation and severe resp distress 9/30 discussion for [facility], ENT consulted     Interim History / Subjective:  Remains on vent Increased Fio2 last night Severe  resp distress +COPD Remains delirious     Antimicrobials:   Antibiotics Given (last 72 hours)     Date/Time Action Medication Dose Rate   11/30/20 0921 New Bag/Given   Ampicillin-Sulbactam (UNASYN) 3 g in sodium chloride 0.9 % 100 mL IVPB 3 g 200 mL/hr   11/30/20 1637 New Bag/Given   Ampicillin-Sulbactam (UNASYN) 3 g in sodium chloride 0.9 % 100 mL IVPB 3 g 200 mL/hr   11/30/20 2124 New Bag/Given   Ampicillin-Sulbactam (UNASYN) 3 g in sodium chloride 0.9 % 100 mL IVPB 3 g 200 mL/hr   12/01/20 0336 New Bag/Given   Ampicillin-Sulbactam (UNASYN) 3 g in sodium chloride 0.9 % 100 mL IVPB 3 g 200 mL/hr   12/01/20 0820 New Bag/Given   Ampicillin-Sulbactam (UNASYN) 3 g in sodium chloride 0.9 % 100 mL IVPB 3 g 200 mL/hr   12/01/20 1618 New Bag/Given   Ampicillin-Sulbactam (UNASYN) 3 g in sodium chloride 0.9 % 100 mL IVPB 3 g 200 mL/hr   12/01/20 2114 New Bag/Given   Ampicillin-Sulbactam (UNASYN) 3 g in sodium chloride 0.9 % 100 mL IVPB 3 g 200 mL/hr   12/02/20 0359 New Bag/Given   Ampicillin-Sulbactam (UNASYN) 3 g in sodium chloride 0.9 % 100 mL IVPB 3 g 200 mL/hr   12/02/20 0941 New Bag/Given   Ampicillin-Sulbactam (UNASYN) 3 g in sodium chloride 0.9 % 100 mL IVPB 3 g 200 mL/hr   12/02/20 1657 New Bag/Given   Ampicillin-Sulbactam (UNASYN) 3 g in sodium chloride 0.9 % 100 mL IVPB 3 g 200 mL/hr  12/02/20 2132 New Bag/Given   Ampicillin-Sulbactam (UNASYN) 3 g in sodium chloride 0.9 % 100 mL IVPB 3 g 200 mL/hr   12/03/20 0315 New Bag/Given   Ampicillin-Sulbactam (UNASYN) 3 g in sodium chloride 0.9 % 100 mL IVPB 3 g 200 mL/hr             Objective   Blood pressure 113/78, pulse 86, temperature 98.1 F (36.7 C), temperature source Esophageal, resp. rate 14, height _0  (1.905 m), weight 124.7 kg, SpO2 94 %.    Vent Mode: PRVC FiO2 (%):  [28 %-50 %] 50 % Set Rate:  [15 bmp-20 bmp] 15 bmp Vt Set:  [550 mL] 550 mL PEEP:  [5 cmH20-8 cmH20] 8 cmH20 Plateau Pressure:  [7 cmH20-16  cmH20] 7 cmH20   Intake/Output Summary (Last 24 hours) at 12/03/2020 0731 Last data filed at 12/03/2020 0408 Gross per 24 hour  Intake 1298.12 ml  Output 3300 ml  Net -2001.88 ml    Filed Weights   12/01/20 0500 12/02/20 0402 12/03/20 0313  Weight: 131 kg 129.4 kg 124.7 kg   REVIEW OF SYSTEMS  PATIENT IS UNABLE TO PROVIDE COMPLETE REVIEW OF SYSTEMS DUE TO SEVERE CRITICAL ILLNESS AND TOXIC METABOLIC ENCEPHALOPATHY   PHYSICAL EXAMINATION:  GENERAL:critically ill appearing, +resp distress EYES: Pupils equal, round, reactive to light.  No scleral icterus.  MOUTH: Moist mucosal membrane. INTUBATED NECK: Supple.  PULMONARY: +rhonchi, +wheezing CARDIOVASCULAR: S1 and S2.  No murmurs  GASTROINTESTINAL: Soft, nontender, -distended. Positive bowel sounds.  MUSCULOSKELETAL: No swelling, clubbing, or edema.  NEUROLOGIC: obtunded SKIN:intact,warm,dry    Labs/imaging that I havepersonally reviewed  (right click and "Reselect all SmartList Selections" daily)     ASSESSMENT AND PLAN SYNOPSIS  55 yo obese white male with acute and severe resp failure with aspiration pnemonia on MV support with toxic metabolic encephalopathy from ETOH withdrawal with ileus failure to wean from vent    Severe ACUTE Hypoxic and Hypercapnic Respiratory Failure -continue Mechanical Ventilator support -continue Bronchodilator Therapy -Wean Fio2 and PEEP as tolerated -VAP/VENT bundle implementation -will NOT perform SAT/SBT when respiratory parameters are met ENT CONSULTED Plan for [facility]  Vent Mode: PRVC FiO2 (%):  [28 %-50 %] 50 % Set Rate:  [15 bmp-20 bmp] 15 bmp Vt Set:  [550 mL] 550 mL PEEP:  [5 cmH20-8 cmH20] 8 cmH20 Plateau Pressure:  [7 cmH20-16 cmH20] 7 cmH20   SEVERE COPD EXACERBATION -continue IV steroids as prescribed -continue NEB THERAPY as prescribed Acute Hypoxemic Respiratory Failure due to Community Acquired Pneumonia    NEUROLOGY ACUTE TOXIC METABOLIC ENCEPHALOPATHY -need  for sedation -Goal RASS -2 to -3  SEVERE ALCOHOL WITHDRAWAL -Therapy with Thiamine and MVI   CARDIAC ICU monitoring  INFECTIOUS DISEASE -continue antibiotics as prescribed -follow up cultures -follow up ID consultation Severe sepsis  SOURCE-aspiration pneumonia -use vasopressors to keep MAP>65 as needed  ENDO - ICU hypoglycemic\Hyperglycemia protocol -check FSBS per protocol   GI GI PROPHYLAXIS as indicated  NUTRITIONAL STATUS DIET-->TF's as tolerated Constipation protocol as indicated   ELECTROLYTES -follow labs as needed -replace as needed -pharmacy consultation and following    Best Practice (right click and "Reselect all SmartList Selections" daily)    Diet/type: NPO, tube feeds DVT prophylaxis: prophylactic heparin  GI prophylaxis: PPI Lines: N/A Foley:  yes, and is still needed Code Status:  full code ICU STATUS     Labs   CBC: Recent Labs  Lab 11/27/20 0614 11/28/20 0459 11/30/20 0401 12/01/20 0339 12/01/20 0441 12/03/20 0346  WBC  10.2 9.4 11.5* 8.5 9.7 10.1  NEUTROABS 7.3  --  8.1* 6.1 6.8 6.1  HGB 13.5 13.5 11.7* 9.5* 11.0* 11.5*  HCT 40.4 40.7 34.6* 27.9* 33.3* 33.3*  MCV 107.4* 107.1* 105.5* 105.7* 105.4* 104.7*  PLT 274 315 367 357 446* 474*     Basic Metabolic Panel: Recent Labs  Lab 11/30/20 0401 12/01/20 0339 12/01/20 0441 12/02/20 0405 12/03/20 0346  NA 141 151* 141 139 138  K 4.1 3.4* 4.1 4.1 3.9  CL 105 106 101 100 97*  CO2 30 28 32 35* 34*  GLUCOSE 127* 98 133* 100* 100*  BUN 21* 20 26* 26* 22*  CREATININE 0.65 0.84 0.67 0.67 0.57*  CALCIUM 8.0* 6.5* 8.2* 8.1* 8.1*  MG 2.2 2.0 2.2 2.1 2.0  PHOS 3.2 3.5 3.4 3.4 3.7    GFR: Estimated Creatinine Clearance: 151.9 mL/min (A) (by C-G formula based on SCr of 0.57 mg/dL (L)). Recent Labs  Lab 11/30/20 0401 12/01/20 0339 12/01/20 0441 12/02/20 0405 12/03/20 0346  PROCALCITON 0.46 0.19 0.28 0.18 0.17  WBC 11.5* 8.5 9.7  --  10.1     Liver Function  Tests: No results for input(s): AST, ALT, ALKPHOS, BILITOT, PROT, ALBUMIN in the last 168 hours.  No results for input(s): LIPASE, AMYLASE in the last 168 hours. No results for input(s): AMMONIA in the last 168 hours.  ABG    Component Value Date/Time   PHART 7.27 (L) 11/28/2020 1349   PCO2ART 70 ([place]) 11/28/2020 1349   PO2ART 79 (L) 11/28/2020 1349   HCO3 31.5 (H) 11/28/2020 1349   ACIDBASEDEF 0.7 11/22/2020 0353   O2SAT 94.6 11/28/2020 1349       DVT/GI PRX  assessed I Assessed the need for Labs I Assessed the need for Foley I Assessed the need for Central Venous Line Family Discussion when available I Assessed the need for Mobilization I made an Assessment of medications to be adjusted accordingly Safety Risk assessment completed  CASE DISCUSSED IN MULTIDISCIPLINARY ROUNDS WITH ICU TEAM     Critical Care Time devoted to patient care services described in this note is 45 minutes.  Critical care was necessary to treat /prevent imminent and life-threatening deterioration. Overall, patient is critically ill, prognosis is guarded.   Corrin Parker, M.D.  [facility]  Medical Director [place] Director [facility] Cardio-Pulmonary Department

## 2020-12-04 LAB — GLUCOSE, CAPILLARY
Glucose-Capillary: 100 mg/dL — ABNORMAL HIGH (ref 70–99)
Glucose-Capillary: 101 mg/dL — ABNORMAL HIGH (ref 70–99)
Glucose-Capillary: 112 mg/dL — ABNORMAL HIGH (ref 70–99)
Glucose-Capillary: 132 mg/dL — ABNORMAL HIGH (ref 70–99)
Glucose-Capillary: 95 mg/dL (ref 70–99)
Glucose-Capillary: 96 mg/dL (ref 70–99)

## 2020-12-04 LAB — BASIC METABOLIC PANEL
Anion gap: 9 (ref 5–15)
BUN: 22 mg/dL — ABNORMAL HIGH (ref 6–20)
CO2: 34 mmol/L — ABNORMAL HIGH (ref 22–32)
Calcium: 8.4 mg/dL — ABNORMAL LOW (ref 8.9–10.3)
Chloride: 96 mmol/L — ABNORMAL LOW (ref 98–111)
Creatinine, Ser: 0.6 mg/dL — ABNORMAL LOW (ref 0.61–1.24)
GFR, Estimated: >60 mL/min (ref >60)
Glucose, Bld: 102 mg/dL — ABNORMAL HIGH (ref 70–99)
Potassium: 4 mmol/L (ref 3.5–5.1)
Sodium: 139 mmol/L (ref 135–145)

## 2020-12-04 LAB — CBC
HCT: 33.9 % — ABNORMAL LOW (ref 39.0–52.0)
Hemoglobin: 11 g/dL — ABNORMAL LOW (ref 13.0–17.0)
MCH: 34.2 pg — ABNORMAL HIGH (ref 26.0–34.0)
MCHC: 32.4 g/dL (ref 30.0–36.0)
MCV: 105.3 fL — ABNORMAL HIGH (ref 80.0–100.0)
Platelets: 501 10*3/uL — ABNORMAL HIGH (ref 150–400)
RBC: 3.22 MIL/uL — ABNORMAL LOW (ref 4.22–5.81)
RDW: 12.6 % (ref 11.5–15.5)
WBC: 11.3 10*3/uL — ABNORMAL HIGH (ref 4.0–10.5)
nRBC: 0 % (ref 0.0–0.2)

## 2020-12-04 LAB — PROCALCITONIN: Procalcitonin: 0.1 ng/mL

## 2020-12-04 LAB — BODY FLUID CULTURE W GRAM STAIN
Culture: NO GROWTH
Gram Stain: NONE SEEN

## 2020-12-04 LAB — MAGNESIUM: Magnesium: 2 mg/dL (ref 1.7–2.4)

## 2020-12-04 LAB — PHOSPHORUS: Phosphorus: 3.9 mg/dL (ref 2.5–4.6)

## 2020-12-04 MED ORDER — GLYCOPYRROLATE 0.2 MG/ML IJ SOLN
0.2000 mg | Freq: Once | INTRAMUSCULAR | Status: AC
  Administered 2020-12-04: 0.2 mg via INTRAVENOUS
  Filled 2020-12-04: qty 1

## 2020-12-04 MED ORDER — CHLORDIAZEPOXIDE HCL 25 MG PO CAPS
25.0000 mg | ORAL_CAPSULE | Freq: Four times a day (QID) | ORAL | Status: DC
  Administered 2020-12-05 – 2020-12-06 (×5): 25 mg via ORAL
  Filled 2020-12-04 (×5): qty 1

## 2020-12-04 MED ORDER — ADULT MULTIVITAMIN W/MINERALS CH
1.0000 | ORAL_TABLET | Freq: Every day | ORAL | Status: DC
  Administered 2020-12-05 – 2020-12-12 (×8): 1 via ORAL
  Filled 2020-12-04 (×8): qty 1

## 2020-12-04 MED ORDER — FOLIC ACID 1 MG PO TABS
1.0000 mg | ORAL_TABLET | Freq: Every day | ORAL | Status: DC
  Administered 2020-12-05 – 2020-12-12 (×8): 1 mg via ORAL
  Filled 2020-12-04 (×8): qty 1

## 2020-12-04 MED ORDER — PNEUMOCOCCAL VAC POLYVALENT 25 MCG/0.5ML IJ INJ
0.5000 mL | INJECTION | INTRAMUSCULAR | Status: AC
  Administered 2020-12-05: 0.5 mL via INTRAMUSCULAR
  Filled 2020-12-04 (×2): qty 0.5

## 2020-12-04 MED ORDER — INFLUENZA VAC SPLIT QUAD 0.5 ML IM SUSY
0.5000 mL | PREFILLED_SYRINGE | INTRAMUSCULAR | Status: AC
  Administered 2020-12-05: 0.5 mL via INTRAMUSCULAR
  Filled 2020-12-04: qty 0.5

## 2020-12-04 MED ORDER — QUETIAPINE FUMARATE 25 MG PO TABS
50.0000 mg | ORAL_TABLET | Freq: Every day | ORAL | Status: DC
  Administered 2020-12-05: 50 mg via ORAL
  Filled 2020-12-04: qty 2

## 2020-12-04 MED ORDER — ONDANSETRON HCL 4 MG/2ML IJ SOLN
4.0000 mg | Freq: Four times a day (QID) | INTRAMUSCULAR | Status: DC | PRN
Start: 2020-12-04 — End: 2020-12-12
  Administered 2020-12-04: 4 mg via INTRAVENOUS
  Filled 2020-12-04: qty 2

## 2020-12-04 MED ORDER — ACETAMINOPHEN 325 MG PO TABS
650.0000 mg | ORAL_TABLET | Freq: Four times a day (QID) | ORAL | Status: DC | PRN
  Administered 2020-12-10 – 2020-12-11 (×2): 650 mg via ORAL
  Filled 2020-12-04 (×2): qty 2

## 2020-12-04 NOTE — Consult Note (Signed)
PHARMACY CONSULT NOTE  Pharmacy Consult for Electrolyte Monitoring and Replacement   Recent Labs: Potassium (mmol/L)  Date Value  12/04/2020 4.0   Magnesium (mg/dL)  Date Value  97/98/9211 2.0   Calcium (mg/dL)  Date Value  94/17/4081 8.4 (L)   Albumin (g/dL)  Date Value  44/81/8563 2.0 (L)   Phosphorus (mg/dL)  Date Value  14/97/0263 3.9   Sodium (mmol/L)  Date Value  12/04/2020 139   Assessment: Patient is a 55 y/o M with medical history including EtOH abuse who presented to the ED 9/19 with chief complaint of SOB, dark colored sputum, back pain and thumb pain. While in the ED, patient began experiencing hallucinations and encephalopathy. Patient ultimately required intubation 9/19. Patient is now admitted to the ICU where he remains intubated, sedated, and on mechanical ventilation. Drinks 1.5 gallons of rum per week. Alcohol withdrawal on Versed infusion, Librium and phenobarbital per tube. Pharmacy consulted to assist with electrolyte monitoring and replacement as indicated.  Nutrition: Tube feeds started 9/20  Goal of Therapy:  Electrolytes within normal limits  Plan:  No electrolyte replacement warranted at this time F/u with AM labs.   Ronnald Ramp, PharmD, BCPS Clinical Pharmacist 12/04/2020 7:46 AM

## 2020-12-04 NOTE — Progress Notes (Addendum)
Busy day. Wake up assessment to extubation to confusion and CIWA 17 by 1600. My patient has been at home, in [place] all afternoon and back somewhere else this evening. Between the hours of 1100- 1400 He vomited 4 times, large amounts of yellow tube feeding. Given prn Zofran with no more vomiting. He is determined to get up and go home but luckily is too weak to get his upper body moving. Bilateral legs have been slung out of bed numerous times this evening.At 1800 patient's voice sounds less wet (thanks to 1 dose of Glycopyrrolate) but he remains confused and yelling help.

## 2020-12-04 NOTE — TOC Progression Note (Signed)
Transition of Care [facility]) - Progression Note    Patient Details  Name: Seth Harper MRN: [id number] Date of Birth: Oct 13, 1965  Transition of Care [facility]) CM/SW Contact  Seth Harper Phone Number: [phone] 12/04/2020, 11:41 AM  Clinical Narrative:     Patient remains intubated/sedated, critically ill, experiencing severe hypoxia.  Patient has hx of alcohol use, 1.5 gallons of rum per week. Patient, alcohol withdrawal on Versed infusion, Librium and phenobarbital per tube.  Patient's main contact is significant other Seth Harper [phone].  Patient's next of kin, sisters Seth Harper and Seth Harper have given verbal permission for Ms. Brothers to make all health care decisions for patient. TOC continues to follow.    Expected Discharge Plan: Skilled Nursing Facility Barriers to Discharge: Inadequate or no insurance, Continued Medical Work up  Expected Discharge Plan and Services Expected Discharge Plan: Skilled Nursing Facility In-house Referral: Clinical Social Work   Post Acute Care Choice: Skilled Nursing Facility Living arrangements for the past 2 months: Single Family Home                                       Social Determinants of Health (SDOH) Interventions    Readmission Risk Interventions No flowsheet data found.

## 2020-12-04 NOTE — Progress Notes (Signed)
NAME:  Seth Harper, MRN:  [id number], DOB:  [birth year], LOS: 13 ADMISSION DATE:  11/21/2020  History of Present Illness:  Seth Harper is a 55 year old male with history of alcohol abuse/dependence who presented to the ER on 9/19 with progressive shortness of breath, dark colored sputum, back pain and thumb pain. He started to have hallucinations with increasing altered mentation while in the ER. Further history obtained by the ER nursing staff from the patient's significant other who reports the patient fell last Friday and lost consciousness. She says he has not slept in days, usually drinks 1.5 gallons of rum per week and his last drink was on Friday prior to the fall. Due to his increasing agitation and altered mentation he was sedated and intubated in the ER. PCCM has been called to admit the patient.    Pertinent  Medical History  EtOH abuse/dependence   Significant Hospital Events: Including procedures, antibiotic start and stop dates in addition to other pertinent events   9/19 Intubated, admitted to the ICU.  LP unsuccessful in the ED.  CT MRI head and EEG negative 9/20 changed to PCV due to vent dyssynchrony, lactic acid improved 9/22: Persistent fevers, recollect Tracheal aspirate, Doppler US BLE, consult ID.  KUB concerning for ileus vs SBO 9/23: Ileus resolved, to go for Fluro guided LP and MRI Brain w/ contrast, ABX changed to Meropenem, Acyclovir added  9/24: Uneventful night, started Precedex to wean off propofol and Versed 9/25: Agitated off midazolam, on Precedex, had to start some propofol.  Initiated through [facility]. 9/26 severe agitation, MRI BRAIN NEG, EEG WNL,  9/27 severe hypoxia, remains on vent 9/28 s/p bronch thick secretions, s/p US thoracentesis 1.2 L removed 9/29 lasix given 4.2 L of urine 9/30 severe agitation and severe resp distress 9/30 discussion for [facility], ENT consulted 10/1 severe hypoxia   Interim History / Subjective:  Remains on vent Severe  COPD Severe Hypoxia ENT consulted for trach Failure to wean from vent +aspiration pneumonia    Antimicrobials:   Antibiotics Given (last 72 hours)     Date/Time Action Medication Dose Rate   12/01/20 0820 New Bag/Given   Ampicillin-Sulbactam (UNASYN) 3 g in sodium chloride 0.9 % 100 mL IVPB 3 g 200 mL/hr   12/01/20 1618 New Bag/Given   Ampicillin-Sulbactam (UNASYN) 3 g in sodium chloride 0.9 % 100 mL IVPB 3 g 200 mL/hr   12/01/20 2114 New Bag/Given   Ampicillin-Sulbactam (UNASYN) 3 g in sodium chloride 0.9 % 100 mL IVPB 3 g 200 mL/hr   12/02/20 0359 New Bag/Given   Ampicillin-Sulbactam (UNASYN) 3 g in sodium chloride 0.9 % 100 mL IVPB 3 g 200 mL/hr   12/02/20 0941 New Bag/Given   Ampicillin-Sulbactam (UNASYN) 3 g in sodium chloride 0.9 % 100 mL IVPB 3 g 200 mL/hr   12/02/20 1657 New Bag/Given   Ampicillin-Sulbactam (UNASYN) 3 g in sodium chloride 0.9 % 100 mL IVPB 3 g 200 mL/hr   12/02/20 2132 New Bag/Given   Ampicillin-Sulbactam (UNASYN) 3 g in sodium chloride 0.9 % 100 mL IVPB 3 g 200 mL/hr   12/03/20 0315 New Bag/Given   Ampicillin-Sulbactam (UNASYN) 3 g in sodium chloride 0.9 % 100 mL IVPB 3 g 200 mL/hr   12/03/20 0957 New Bag/Given   Ampicillin-Sulbactam (UNASYN) 3 g in sodium chloride 0.9 % 100 mL IVPB 3 g 200 mL/hr   12/03/20 1631 New Bag/Given   Ampicillin-Sulbactam (UNASYN) 3 g in sodium chloride 0.9 % 100 mL IVPB 3  g 200 mL/hr   12/03/20 2100 New Bag/Given   Ampicillin-Sulbactam (UNASYN) 3 g in sodium chloride 0.9 % 100 mL IVPB 3 g 200 mL/hr   12/04/20 0426 New Bag/Given   Ampicillin-Sulbactam (UNASYN) 3 g in sodium chloride 0.9 % 100 mL IVPB 3 g 200 mL/hr             Objective   Blood pressure 100/67, pulse 78, temperature 97.7 F (36.5 C), resp. rate 14, height 6\' 3"  (1.905 m), weight 124.7 kg, SpO2 95 %.    Vent Mode: PRVC FiO2 (%):  [50 %-60 %] 50 % Set Rate:  [15 bmp-20 bmp] 15 bmp Vt Set:  [550 mL] 550 mL PEEP:  [8 cmH20-10 cmH20] 8  cmH20 Plateau Pressure:  [22 cmH20] 22 cmH20   Intake/Output Summary (Last 24 hours) at 12/04/2020 0736 Last data filed at 12/04/2020 0255 Gross per 24 hour  Intake 712.12 ml  Output 2375 ml  Net -1662.88 ml    Filed Weights   12/01/20 0500 12/02/20 0402 12/03/20 0313  Weight: 131 kg 129.4 kg 124.7 kg   REVIEW OF SYSTEMS  PATIENT IS UNABLE TO PROVIDE COMPLETE REVIEW OF SYSTEMS DUE TO SEVERE CRITICAL ILLNESS AND TOXIC METABOLIC ENCEPHALOPATHY  PHYSICAL EXAMINATION:  GENERAL:critically ill appearing, +resp distress EYES: Pupils equal, round, reactive to light.  No scleral icterus.  MOUTH: Moist mucosal membrane. INTUBATED NECK: Supple.  PULMONARY: +rhonchi, +wheezing CARDIOVASCULAR: S1 and S2.  No murmurs  GASTROINTESTINAL: Soft, nontender, -distended. Positive bowel sounds.  MUSCULOSKELETAL: No swelling, clubbing, or edema.  NEUROLOGIC: obtunded SKIN:intact,warm,dry      Labs/imaging that I havepersonally reviewed  (right click and "Reselect all SmartList Selections" daily)     ASSESSMENT AND PLAN SYNOPSIS  55 yo obese white male with acute and severe resp failure with aspiration pnemonia on MV support with toxic metabolic encephalopathy from ETOH withdrawal with ileus failure to wean from vent   Severe ACUTE Hypoxic and Hypercapnic Respiratory Failure -continue Mechanical Ventilator support -continue Bronchodilator Therapy -Wean Fio2 and PEEP as tolerated -VAP/VENT bundle implementation -will NOT perform SAT/SBT when respiratory parameters are met Severe hypoxia ENT CONSULTED Plan for [facility] Sister and S.O. will sign consent  Vent Mode: PRVC FiO2 (%):  [50 %-60 %] 50 % Set Rate:  [15 bmp-20 bmp] 15 bmp Vt Set:  [550 mL] 550 mL PEEP:  [8 cmH20-10 cmH20] 8 cmH20 Plateau Pressure:  [22 cmH20] 22 cmH20  SEVERE COPD EXACERBATION -continue IV steroids as prescribed -continue NEB THERAPY as prescribed Acute Hypoxemic Respiratory Failure due to  Pneumonia     NEUROLOGY ACUTE TOXIC METABOLIC ENCEPHALOPATHY -need for sedation -Goal RASS -2 to -3  SEVERE ALCOHOL WITHDRAWAL -Therapy with Thiamine and MVI   CARDIAC ICU monitoring    INFECTIOUS DISEASE SOURCE-aspiration pneumonia -use vasopressors to keep MAP>65 as needed IV ABX per ID  ENDO - ICU hypoglycemic\Hyperglycemia protocol -check FSBS per protocol    GI GI PROPHYLAXIS as indicated  NUTRITIONAL STATUS DIET-->TF's as tolerated Constipation protocol as indicated  ELECTROLYTES -follow labs as needed -replace as needed -pharmacy consultation and following  Best Practice (right click and "Reselect all SmartList Selections" daily)    Diet/type: NPO, tube feeds DVT prophylaxis: prophylactic heparin  GI prophylaxis: PPI Lines: N/A Foley:  yes, and is still needed Code Status:  full code ICU STATUS     Labs   CBC: Recent Labs  Lab 11/30/20 0401 12/01/20 0339 12/01/20 0441 12/03/20 0346 12/04/20 0543  WBC 11.5* 8.5 9.7 10.1 11.3*  NEUTROABS 8.1* 6.1 6.8 6.1  --   HGB 11.7* 9.5* 11.0* 11.5* 11.0*  HCT 34.6* 27.9* 33.3* 33.3* 33.9*  MCV 105.5* 105.7* 105.4* 104.7* 105.3*  PLT 367 357 446* 474* 501*     Basic Metabolic Panel: Recent Labs  Lab 12/01/20 0339 12/01/20 0441 12/02/20 0405 12/03/20 0346 12/04/20 0543  NA 151* 141 139 138 139  K 3.4* 4.1 4.1 3.9 4.0  CL 106 101 100 97* 96*  CO2 28 32 35* 34* 34*  GLUCOSE 98 133* 100* 100* 102*  BUN 20 26* 26* 22* 22*  CREATININE 0.84 0.67 0.67 0.57* 0.60*  CALCIUM 6.5* 8.2* 8.1* 8.1* 8.4*  MG 2.0 2.2 2.1 2.0 2.0  PHOS 3.5 3.4 3.4 3.7 3.9    GFR: Estimated Creatinine Clearance: 151.9 mL/min (A) (by C-G formula based on SCr of 0.6 mg/dL (L)). Recent Labs  Lab 12/01/20 0339 12/01/20 0441 12/02/20 0405 12/03/20 0346 12/04/20 0543  PROCALCITON 0.19 0.28 0.18 0.17  --   WBC 8.5 9.7  --  10.1 11.3*     Liver Function Tests: No results for input(s): AST, ALT, ALKPHOS, BILITOT, PROT, ALBUMIN in  the last 168 hours.  No results for input(s): LIPASE, AMYLASE in the last 168 hours. No results for input(s): AMMONIA in the last 168 hours.  ABG    Component Value Date/Time   PHART 7.54 (H) 12/03/2020 2030   PCO2ART 47 12/03/2020 2030   PO2ART 122 (H) 12/03/2020 2030   HCO3 40.2 (H) 12/03/2020 2030   ACIDBASEDEF 0.7 11/22/2020 0353   O2SAT 99.1 12/03/2020 2030      DVT/GI PRX  assessed I Assessed the need for Labs I Assessed the need for Foley I Assessed the need for Central Venous Line Family Discussion when available I Assessed the need for Mobilization I made an Assessment of medications to be adjusted accordingly Safety Risk assessment completed  CASE DISCUSSED IN MULTIDISCIPLINARY ROUNDS WITH ICU TEAM     Critical Care Time devoted to patient care services described in this note is 45 minutes.  Critical care was necessary to treat /prevent imminent and life-threatening deterioration. Overall, patient is critically ill, prognosis is guarded.     Corrin Parker, M.D.  [facility]  Medical Director [place] Director [facility] Cardio-Pulmonary Department

## 2020-12-05 ENCOUNTER — Inpatient Hospital Stay: Payer: Medicaid Other

## 2020-12-05 ENCOUNTER — Encounter: Payer: Self-pay | Admitting: Pulmonary Disease

## 2020-12-05 LAB — GLUCOSE, CAPILLARY
Glucose-Capillary: 90 mg/dL (ref 70–99)
Glucose-Capillary: 93 mg/dL (ref 70–99)
Glucose-Capillary: 98 mg/dL (ref 70–99)
Glucose-Capillary: 91 mg/dL (ref 70–99)
Glucose-Capillary: 92 mg/dL (ref 70–99)
Glucose-Capillary: 85 mg/dL (ref 70–99)

## 2020-12-05 LAB — BASIC METABOLIC PANEL
Anion gap: 9 (ref 5–15)
BUN: 15 mg/dL (ref 6–20)
CO2: 30 mmol/L (ref 22–32)
Calcium: 8.3 mg/dL — ABNORMAL LOW (ref 8.9–10.3)
Chloride: 98 mmol/L (ref 98–111)
Creatinine, Ser: 0.54 mg/dL — ABNORMAL LOW (ref 0.61–1.24)
GFR, Estimated: >60 mL/min (ref >60)
Glucose, Bld: 101 mg/dL — ABNORMAL HIGH (ref 70–99)
Potassium: 3.5 mmol/L (ref 3.5–5.1)
Sodium: 137 mmol/L (ref 135–145)

## 2020-12-05 LAB — CBC
HCT: 35.5 % — ABNORMAL LOW (ref 39.0–52.0)
Hemoglobin: 12.1 g/dL — ABNORMAL LOW (ref 13.0–17.0)
MCH: 34.4 pg — ABNORMAL HIGH (ref 26.0–34.0)
MCHC: 34.1 g/dL (ref 30.0–36.0)
MCV: 100.9 fL — ABNORMAL HIGH (ref 80.0–100.0)
Platelets: 543 10*3/uL — ABNORMAL HIGH (ref 150–400)
RBC: 3.52 MIL/uL — ABNORMAL LOW (ref 4.22–5.81)
RDW: 12.2 % (ref 11.5–15.5)
WBC: 14.5 10*3/uL — ABNORMAL HIGH (ref 4.0–10.5)
nRBC: 0 % (ref 0.0–0.2)

## 2020-12-05 LAB — PROCALCITONIN: Procalcitonin: 0.12 ng/mL

## 2020-12-05 LAB — MAGNESIUM: Magnesium: 1.9 mg/dL (ref 1.7–2.4)

## 2020-12-05 LAB — PHOSPHORUS: Phosphorus: 3.1 mg/dL (ref 2.5–4.6)

## 2020-12-05 MED ORDER — PREDNISONE 10 MG PO TABS
5.0000 mg | ORAL_TABLET | Freq: Every day | ORAL | Status: AC
  Administered 2020-12-09: 5 mg via ORAL
  Filled 2020-12-05: qty 1

## 2020-12-05 MED ORDER — PREDNISONE 10 MG PO TABS
10.0000 mg | ORAL_TABLET | Freq: Every day | ORAL | Status: AC
  Administered 2020-12-08: 10 mg via ORAL
  Filled 2020-12-05: qty 1

## 2020-12-05 MED ORDER — ENSURE ENLIVE PO LIQD
237.0000 mL | Freq: Three times a day (TID) | ORAL | Status: DC
  Administered 2020-12-05 – 2020-12-12 (×20): 237 mL via ORAL

## 2020-12-05 MED ORDER — METHYLPREDNISOLONE SODIUM SUCC 40 MG IJ SOLR
20.0000 mg | INTRAMUSCULAR | Status: DC
  Administered 2020-12-05: 20 mg via INTRAVENOUS
  Filled 2020-12-05: qty 1

## 2020-12-05 MED ORDER — PREDNISONE 20 MG PO TABS
30.0000 mg | ORAL_TABLET | Freq: Every day | ORAL | Status: AC
  Administered 2020-12-06: 30 mg via ORAL
  Filled 2020-12-05: qty 1

## 2020-12-05 MED ORDER — POTASSIUM CHLORIDE 10 MEQ/50ML IV SOLN
10.0000 meq | INTRAVENOUS | Status: AC
  Administered 2020-12-05 (×4): 10 meq via INTRAVENOUS
  Filled 2020-12-05 (×4): qty 50

## 2020-12-05 MED ORDER — PREDNISONE 20 MG PO TABS
20.0000 mg | ORAL_TABLET | Freq: Every day | ORAL | Status: AC
  Administered 2020-12-07: 20 mg via ORAL
  Filled 2020-12-05: qty 1

## 2020-12-05 NOTE — Evaluation (Signed)
Occupational Therapy Evaluation Patient Details Name: Seth Harper MRN: [id number] DOB: 1965/09/19 Today's Date: 12/05/2020   History of Present Illness 55 year old male with history of alcohol abuse/dependence who presented to the ER on 9/19 with progressive shortness of breath, dark colored sputum, back pain and thumb pain. He started to have hallucinations with increasing altered mentation while in the ER. Due to his increasing agitation and altered mentation he was sedated and intubated in the ER and admitted to the ICU. Extubated on 12/05/20.   Clinical Impression   Pt seen for OT evaluation and co-tx with PT. Pt alert and oriented x3, spouse and sister present. Pt endorses feeling eager to get home. Pt and spouse live in a 1 story home with ramped entrance and have DME that belongs to the pt's spouse. Pt currently presents with significant deficits in strength, activity tolerance, balance, safety awareness, and awareness of deficits requiring MAX A x2 for supine<>sit transfers, TOTAL A x2 for lateral scoot attempts, and MAX A for bed level bathing, dressing, toileting, and grooming tasks 2/2 noted impairments. Pt unable to maintain static sitting balance EOB without significant assist. On 6L O2 throughout session. Pt/family instructed in bed level BUE exercises and AAROM stretches to support improved strength, activity tolerance, and minimize stiffness. Recommend short term rehab to maximize return to PLOF upon discharge.      Recommendations for follow up therapy are one component of a multi-disciplinary discharge planning process, led by the attending physician.  Recommendations may be updated based on patient status, additional functional criteria and insurance authorization.   Follow Up Recommendations  SNF    Equipment Recommendations  Other (comment) (TBD at next venue of care)    Recommendations for Other Services       Precautions / Restrictions Precautions Precautions:  Fall Precaution Comments: MD note indicates L1 compression fx Restrictions Weight Bearing Restrictions: No      Mobility Bed Mobility Overal bed mobility: Needs Assistance Bed Mobility: Supine to Sit;Sit to Supine     Supine to sit: Max assist;+2 for physical assistance;HOB elevated Sit to supine: Max assist;+2 for physical assistance   General bed mobility comments: heavy assist from OT/PT to perform    Transfers Overall transfer level: Needs assistance   Transfers: Lateral/Scoot Transfers          Lateral/Scoot Transfers: Total assist;+2 physical assistance      Balance Overall balance assessment: Needs assistance;History of Falls Sitting-balance support: Bilateral upper extremity supported Sitting balance-Leahy Scale: Zero Sitting balance - Comments: Pt requires varying assist to maintain static sitting balance, generally MOD-MAX A x1 or more     Standing balance-Leahy Scale: Zero                             ADL either performed or assessed with clinical judgement   ADL Overall ADL's : Needs assistance/impaired                                       General ADL Comments: Pt currently requires MAX A for bed level grooming tasks 2/2 decreased strength, balance, and activity tolerance, MAX A for bed level bathing and dressing tasks as well 2/2 poor sitting balance     Vision Patient Visual Report: No change from baseline       Perception     Praxis  Pertinent Vitals/Pain Pain Assessment: No/denies pain     Hand Dominance Right   Extremity/Trunk Assessment Upper Extremity Assessment Upper Extremity Assessment: Generalized weakness   Lower Extremity Assessment Lower Extremity Assessment: Generalized weakness;Defer to PT evaluation (pt reports decreased sensation bilaterally)   Cervical / Trunk Assessment Cervical / Trunk Assessment: Other exceptions Cervical / Trunk Exceptions: note L1 compression fx   Communication  Communication Communication: No difficulties   Cognition Arousal/Alertness: Awake/alert   Overall Cognitive Status: Impaired/Different from baseline Area of Impairment: Problem solving;Safety/judgement;Following commands;Orientation                 Orientation Level: Disoriented to;Time     Following Commands: Follows one step commands with increased time Safety/Judgement: Decreased awareness of safety;Decreased awareness of deficits   Problem Solving: Slow processing     General Comments       Exercises Other Exercises Other Exercises: Pt instructed in BUE exercises and AAROM ROM/stretching with assist from family to minimize stiffness and maximize functional use   Shoulder Instructions      Home Living Family/patient expects to be discharged to:: Private residence Living Arrangements: Spouse/significant other Available Help at Discharge: Family;Available 24 hours/day (can arrange 24/7 if needed) Type of Home: House Home Access: Ramped entrance     Home Layout: One level     Bathroom Shower/Tub: Tub/shower unit;Walk-in shower   Bathroom Toilet: Handicapped height     Home Equipment: Environmental consultant - 2 wheels;Cane - single point;Bedside commode;Wheelchair - manual;Wheelchair - power   Additional Comments: AE/DME is the pt's wife's (hx of LLE amputation, uses prosthesis)      Prior Functioning/Environment Level of Independence: Independent        Comments: Pt indep at baseline, drives, etc. Pt has had a few falls in past 29mo, more recent        OT Problem List: Decreased strength;Decreased cognition;Impaired sensation;Decreased safety awareness;Decreased activity tolerance;Impaired balance (sitting and/or standing);Decreased knowledge of use of DME or AE;Impaired UE functional use      OT Treatment/Interventions: Self-care/ADL training;Therapeutic exercise;Therapeutic activities;Cognitive remediation/compensation;DME and/or AE instruction;Patient/family  education;Balance training    OT Goals(Current goals can be found in the care plan section) Acute Rehab OT Goals Patient Stated Goal: get stronger and go home OT Goal Formulation: With patient/family Time For Goal Achievement: 12/19/20 Potential to Achieve Goals: Good ADL Goals Pt Will Perform Grooming: sitting;with min assist Pt Will Perform Lower Body Dressing: sit to/from stand;with mod assist Pt Will Transfer to Toilet: with mod assist;bedside commode;ambulating (LRAD) Additional ADL Goal #1: Pt will perform bed mobility with MOD A x1 in anticipation of seated ADL EOB.  OT Frequency: Min 2X/week   Barriers to D/C:            Co-evaluation PT/OT/SLP Co-Evaluation/Treatment: Yes Reason for Co-Treatment: For patient/therapist safety;To address functional/ADL transfers PT goals addressed during session: Mobility/safety with mobility;Balance;Strengthening/ROM OT goals addressed during session: ADL's and self-care;Strengthening/ROM      AM-PAC OT "6 Clicks" Daily Activity     Outcome Measure Help from another person eating meals?: A Lot Help from another person taking care of personal grooming?: A Lot Help from another person toileting, which includes using toliet, bedpan, or urinal?: Total Help from another person bathing (including washing, rinsing, drying)?: A Lot Help from another person to put on and taking off regular upper body clothing?: A Lot Help from another person to put on and taking off regular lower body clothing?: A Lot 6 Click Score: 11   End of Session Nurse  Communication: Mobility status  Activity Tolerance: Patient tolerated treatment well Patient left: in bed;with call bell/phone within reach;with bed alarm set;with family/visitor present;with SCD's reapplied  OT Visit Diagnosis: Other abnormalities of gait and mobility (R26.89);Repeated falls (R29.6);Muscle weakness (generalized) (M62.81)                Time: 2841-3244 OT Time Calculation (min): 29  min Charges:  OT General Charges $OT Visit: 1 Visit OT Evaluation $OT Eval High Complexity: 1 High  Arman Filter., MPH, MS, OTR/L ascom [phone] 12/05/20, 3:15 PM

## 2020-12-05 NOTE — Progress Notes (Signed)
Pt advanced to regular diet. Taking PO meds w/ apple sauce.   Pt weaned to 4 L [place].   Foley removed per order. External cath applied.   Pt put out 1.6 L urine.   No BM.   Pt remains Ox1-2. Able to converse.   Turn q2. Pt in no acute distress @ this time. Will continue to monitor.

## 2020-12-05 NOTE — Care Plan (Signed)
[facility] Pick up Note:  55 y.o. M with alcohol dependence p/w SOB, sputum.  Started to have DTs in the ER, intubated.   9/19 admitted to ICU 9/23 LP 9/23  9/28 CXR showed left lung whiteout --> thoracentesis  10/3 extubated, stable for transfer to stepdown  Incidental L1 comp fx.

## 2020-12-05 NOTE — Progress Notes (Signed)
NAME:  Seth Harper, MRN:  [id number], DOB:  12/09/1965, LOS: 14 ADMISSION DATE:  11/21/2020  History of Present Illness:  Seth Harper is a 55 year old male with history of alcohol abuse/dependence who presented to the ER on 9/19 with progressive shortness of breath, dark colored sputum, back pain and thumb pain. He started to have hallucinations with increasing altered mentation while in the ER. Further history obtained by the ER nursing staff from the patient's significant other who reports the patient fell last Friday and lost consciousness. She says he has not slept in days, usually drinks 1.5 gallons of rum per week and his last drink was on Friday prior to the fall. Due to his increasing agitation and altered mentation he was sedated and intubated in the ER. PCCM has been called to admit the patient.    Pertinent  Medical History  EtOH abuse/dependence   Significant Hospital Events: Including procedures, antibiotic start and stop dates in addition to other pertinent events   9/19 Intubated, admitted to the ICU.  LP unsuccessful in the ED.  CT MRI head and EEG negative 9/20 changed to PCV due to vent dyssynchrony, lactic acid improved 9/22: Persistent fevers, recollect Tracheal aspirate, Doppler US BLE, consult ID.  KUB concerning for ileus vs SBO 9/23: Ileus resolved, to go for Fluro guided LP and MRI Brain w/ contrast, ABX changed to Meropenem, Acyclovir added  9/24: Uneventful night, started Precedex to wean off propofol and Versed 9/25: Agitated off midazolam, on Precedex, had to start some propofol.  Initiated through [facility]. 9/26 severe agitation, MRI BRAIN NEG, EEG WNL,  9/27 severe hypoxia, remains on vent 9/28 s/p bronch thick secretions, s/p US thoracentesis 1.2 L removed 9/29 lasix given 4.2 L of urine 9/30 severe agitation and severe resp distress 9/30 discussion for [facility], ENT consulted 10/1 severe hypoxia 12/05/20- patient passed SLP eating with help from wife. Speaking in  full sentences.  PT/OT initiated.    Interim History / Subjective:  Remains on vent Severe COPD Severe Hypoxia ENT consulted for trach Failure to wean from vent +aspiration pneumonia? -passed bedside slp    Antimicrobials:   Antibiotics Given (last 72 hours)     Date/Time Action Medication Dose Rate   12/02/20 0941 New Bag/Given   Ampicillin-Sulbactam (UNASYN) 3 g in sodium chloride 0.9 % 100 mL IVPB 3 g 200 mL/hr   12/02/20 1657 New Bag/Given   Ampicillin-Sulbactam (UNASYN) 3 g in sodium chloride 0.9 % 100 mL IVPB 3 g 200 mL/hr   12/02/20 2132 New Bag/Given   Ampicillin-Sulbactam (UNASYN) 3 g in sodium chloride 0.9 % 100 mL IVPB 3 g 200 mL/hr   12/03/20 0315 New Bag/Given   Ampicillin-Sulbactam (UNASYN) 3 g in sodium chloride 0.9 % 100 mL IVPB 3 g 200 mL/hr   12/03/20 0957 New Bag/Given   Ampicillin-Sulbactam (UNASYN) 3 g in sodium chloride 0.9 % 100 mL IVPB 3 g 200 mL/hr   12/03/20 1631 New Bag/Given   Ampicillin-Sulbactam (UNASYN) 3 g in sodium chloride 0.9 % 100 mL IVPB 3 g 200 mL/hr   12/03/20 2100 New Bag/Given   Ampicillin-Sulbactam (UNASYN) 3 g in sodium chloride 0.9 % 100 mL IVPB 3 g 200 mL/hr   12/04/20 0426 New Bag/Given   Ampicillin-Sulbactam (UNASYN) 3 g in sodium chloride 0.9 % 100 mL IVPB 3 g 200 mL/hr   12/04/20 0918 New Bag/Given   Ampicillin-Sulbactam (UNASYN) 3 g in sodium chloride 0.9 % 100 mL IVPB 3 g 200 mL/hr  12/04/20 1553 New Bag/Given   Ampicillin-Sulbactam (UNASYN) 3 g in sodium chloride 0.9 % 100 mL IVPB 3 g 200 mL/hr   12/04/20 2120 New Bag/Given   Ampicillin-Sulbactam (UNASYN) 3 g in sodium chloride 0.9 % 100 mL IVPB 3 g 200 mL/hr             Objective   Blood pressure (!) 143/87, pulse 89, temperature 98.8 F (37.1 C), temperature source Oral, resp. rate (!) 24, height 6\' 3"  (1.905 m), weight 122.4 kg, SpO2 97 %.    Vent Mode: PSV;CPAP FiO2 (%):  [40 %] 40 % PEEP:  [5 cmH20] 5 cmH20 Pressure Support:  [5 cmH20] 5 cmH20    Intake/Output Summary (Last 24 hours) at 12/05/2020 0815 Last data filed at 12/05/2020 0400 Gross per 24 hour  Intake 1255 ml  Output 3850 ml  Net -2595 ml    Filed Weights   12/03/20 0313 12/04/20 0800 12/05/20 0432  Weight: 124.7 kg 124.7 kg 122.4 kg   REVIEW OF SYSTEMS  PATIENT IS UNABLE TO PROVIDE COMPLETE REVIEW OF SYSTEMS DUE TO SEVERE CRITICAL ILLNESS AND TOXIC METABOLIC ENCEPHALOPATHY  PHYSICAL EXAMINATION:  GENERAL:age appropriate in NAD EYES: Pupils equal, round, reactive to light.  No scleral icterus.  MOUTH: Moist mucosal membrane. INTUBATED NECK: Supple.  PULMONARY: mild rhonchi CARDIOVASCULAR: S1 and S2.  No murmurs  GASTROINTESTINAL: Soft, nontender, -distended. Positive bowel sounds.  MUSCULOSKELETAL: No swelling, clubbing, or edema.  NEUROLOGIC: AAOX self and place no FND grossly SKIN:intact,warm,dry      Labs/imaging that I havepersonally reviewed  (right click and "Reselect all SmartList Selections" daily)     ASSESSMENT AND PLAN SYNOPSIS  55 yo obese white male with acute and severe resp failure with aspiration pnemonia on MV support with toxic metabolic encephalopathy from ETOH withdrawal with ileus failure to wean from vent   Severe ACUTE Hypoxic and Hypercapnic Respiratory Failure -RESOLVED - POSSIBLE ASPIRATION EVENT?     SEVERE COPD EXACERBATION-RESOLVED  -  SEVERE ALCOHOL WITHDRAWAL -Therapy with Thiamine and MVI   CARDIAC ICU monitoring    INFECTIOUS DISEASE SOURCE-aspiration pneumonia -use vasopressors to keep MAP>65 as needed IV ABX per ID  ENDO - ICU hypoglycemic\Hyperglycemia protocol -check FSBS per protocol    GI GI PROPHYLAXIS as indicated  NUTRITIONAL STATUS DIET-->TF's as tolerated Constipation protocol as indicated  ELECTROLYTES -follow labs as needed -replace as needed -pharmacy consultation and following  Best Practice (right click and "Reselect all SmartList Selections" daily)    Diet/type:  NPO, tube feeds DVT prophylaxis: prophylactic heparin  GI prophylaxis: PPI Lines: N/A Foley:  yes, and is still needed Code Status:  full code ICU STATUS     Labs   CBC: Recent Labs  Lab 11/30/20 0401 12/01/20 0339 12/01/20 0441 12/03/20 0346 12/04/20 0543 12/05/20 0421  WBC 11.5* 8.5 9.7 10.1 11.3* 14.5*  NEUTROABS 8.1* 6.1 6.8 6.1  --   --   HGB 11.7* 9.5* 11.0* 11.5* 11.0* 12.1*  HCT 34.6* 27.9* 33.3* 33.3* 33.9* 35.5*  MCV 105.5* 105.7* 105.4* 104.7* 105.3* 100.9*  PLT 367 357 446* 474* 501* 543*     Basic Metabolic Panel: Recent Labs  Lab 12/01/20 0441 12/02/20 0405 12/03/20 0346 12/04/20 0543 12/05/20 0421  NA 141 139 138 139 137  K 4.1 4.1 3.9 4.0 3.5  CL 101 100 97* 96* 98  CO2 32 35* 34* 34* 30  GLUCOSE 133* 100* 100* 102* 101*  BUN 26* 26* 22* 22* 15  CREATININE 0.67 0.67 0.57* 0.60*  0.54*  CALCIUM 8.2* 8.1* 8.1* 8.4* 8.3*  MG 2.2 2.1 2.0 2.0 1.9  PHOS 3.4 3.4 3.7 3.9 3.1    GFR: Estimated Creatinine Clearance: 150.6 mL/min (A) (by C-G formula based on SCr of 0.54 mg/dL (L)). Recent Labs  Lab 12/01/20 0441 12/02/20 0405 12/03/20 0346 12/04/20 0543 12/05/20 0421  PROCALCITON 0.28 0.18 0.17 0.10 0.12  WBC 9.7  --  10.1 11.3* 14.5*     Liver Function Tests: No results for input(s): AST, ALT, ALKPHOS, BILITOT, PROT, ALBUMIN in the last 168 hours.  No results for input(s): LIPASE, AMYLASE in the last 168 hours. No results for input(s): AMMONIA in the last 168 hours.  ABG    Component Value Date/Time   PHART 7.54 (H) 12/03/2020 2030   PCO2ART 47 12/03/2020 2030   PO2ART 122 (H) 12/03/2020 2030   HCO3 40.2 (H) 12/03/2020 2030   ACIDBASEDEF 0.7 11/22/2020 0353   O2SAT 99.1 12/03/2020 2030      DVT/GI PRX  assessed I Assessed the need for Labs I Assessed the need for Foley I Assessed the need for Central Venous Line Family Discussion when available I Assessed the need for Mobilization I made an Assessment of medications to be  adjusted accordingly Safety Risk assessment completed  CASE DISCUSSED IN MULTIDISCIPLINARY ROUNDS WITH ICU TEAM    Critical care provider statement:   Total critical care time: 33 minutes   Performed by: Karna Christmas MD   Critical care time was exclusive of separately billable procedures and treating other patients.   Critical care was necessary to treat or prevent imminent or life-threatening deterioration.   Critical care was time spent personally by me on the following activities: development of treatment plan with patient and/or surrogate as well as nursing, discussions with consultants, evaluation of patient's response to treatment, examination of patient, obtaining history from patient or surrogate, ordering and performing treatments and interventions, ordering and review of laboratory studies, ordering and review of radiographic studies, pulse oximetry and re-evaluation of patient's condition.    Vida Rigger, M.D.  Pulmonary & Critical Care Medicine

## 2020-12-05 NOTE — Evaluation (Signed)
Physical Therapy Evaluation Patient Details Name: Seth Harper MRN: [id number] DOB: 1966-01-25 Today's Date: 12/05/2020  History of Present Illness  Patient is a 55 year old male with alcohol dependence who presented to the ER on 9/19 with progressive shortness of breath, dark colored sputum, back pain and thumb pain. Patient started having hallucinations with increased altered mental status and was intubated. Patient with severe COPD exacerbation and severe acute hypoxic and hypercapnic Respiratory Failure. Patient extubated 10/3.  Clinical Impression  PT evaluation completed. Family at the bedside. Patient agreeable to PT and states his goal is to go home. Previously patient is independent with mobility and lives with spouse.   Currently patient is not at his baseline level of functional mobility. He required +2 person assistance for bed mobility. Sitting balance is impaired with decreased protective righting reactions. Total assistance required for scooting to the left with minimal to no weight acceptance on BLE with transfer efforts.   Patient has a supportive family. SNF is recommended at this time for ongoing PT efforts, although patient wants to go home. PT will continue to follow to maximize independence and facilitate return to prior level of function.      Recommendations for follow up therapy are one component of a multi-disciplinary discharge planning process, led by the attending physician.  Recommendations may be updated based on patient status, additional functional criteria and insurance authorization.  Follow Up Recommendations SNF    Equipment Recommendations   (to be determined at next level of care)    Recommendations for Other Services       Precautions / Restrictions Precautions Precautions: Fall Precaution Comments: MD note indicates L1 compression fx Restrictions Weight Bearing Restrictions: No      Mobility  Bed Mobility Overal bed mobility: Needs  Assistance Bed Mobility: Supine to Sit;Sit to Supine     Supine to sit: Max assist;+2 for physical assistance;HOB elevated Sit to supine: Max assist;+2 for physical assistance;HOB elevated   General bed mobility comments: verbal cues for technique and sequencing. assistance for LE and trunk support. heavy +2 assistance required with increased time and effort from patient    Transfers Overall transfer level: Needs assistance   Transfers: Lateral/Scoot Transfers          Lateral/Scoot Transfers: Total assist;+2 physical assistance General transfer comment: total assistance for scooting x 2 bouts to the left in seated position. patient is limited by impaired activity tolerance, fatigue with minimal activity, generalized weakness. minimal to no weight acceptance in BLE with forward weight shifting and transfer efforts. unable to progress to standing at this time.  Ambulation/Gait             General Gait Details: unable to at this time  Stairs            Wheelchair Mobility    Modified Rankin (Stroke Patients Only)       Balance Overall balance assessment: Needs assistance;History of Falls Sitting-balance support: Bilateral upper extremity supported Sitting balance-Leahy Scale: Zero Sitting balance - Comments: Mod A- Max A required to maintain sitting balance with and without UE support. posterior lean primarly but loss of balance and decreased protective righting reactions in all directions     Standing balance-Leahy Scale: Zero                               Pertinent Vitals/Pain Pain Assessment: No/denies pain    Home Living Family/patient expects to be discharged  to:: Private residence Living Arrangements: Spouse/significant other Available Help at Discharge: Family;Available 24 hours/day Type of Home: House Home Access: Ramped entrance     Home Layout: One level Home Equipment: Walker - 2 wheels;Cane - single point;Bedside  commode;Wheelchair - Engineer, technical sales - power Additional Comments: DME was/is used for the spouse who has a history of amputation    Prior Function Level of Independence: Independent         Comments: drives, independent with mobility. patient has had several falls in the past 6 months per spouse     Hand Dominance   Dominant Hand: Right    Extremity/Trunk Assessment   Upper Extremity Assessment Upper Extremity Assessment: Generalized weakness    Lower Extremity Assessment Lower Extremity Assessment: LLE deficits/detail;RLE deficits/detail (no focal deficits noted. weakness appears generalized) RLE Deficits / Details: generalized weakness throuhgout with no focal deficits noted. ankle dorsiflexion and plantarflexion 4+/5 RLE Sensation:  (spouse reports patient complained of numbness in calfs. patient states today that he cannot tell. difficult to assess given slow processing) LLE Deficits / Details: generalized weakness throuhgout with no focal deficits noted. ankle dorsiflexion and plantarflexion 4+/5 LLE Sensation:  (spouse reports patient complained of numbness in calfs. patient states today that he cannot tell. difficult to assess given slow processing)    Cervical / Trunk Assessment Cervical / Trunk Assessment: Other exceptions Cervical / Trunk Exceptions: note L1 compression fx  Communication   Communication: No difficulties  Cognition Arousal/Alertness: Awake/alert Behavior During Therapy: WFL for tasks assessed/performed Overall Cognitive Status: Impaired/Different from baseline Area of Impairment: Problem solving;Safety/judgement;Following commands;Orientation                 Orientation Level: Disoriented to;Time     Following Commands: Follows one step commands with increased time Safety/Judgement: Decreased awareness of safety;Decreased awareness of deficits   Problem Solving: Slow processing        General Comments      Exercises Other  Exercises Other Exercises: patient instructed in BLE therapeutic exercises for strenthening/ROM. he demonstrated understanding of heelslide (through partial ROM0 and ankle pumps.   Assessment/Plan    PT Assessment Patient needs continued PT services  PT Problem List Decreased strength;Decreased range of motion;Decreased activity tolerance;Decreased balance;Decreased mobility;Decreased cognition;Decreased safety awareness       PT Treatment Interventions DME instruction;Gait training;Stair training;Functional mobility training;Therapeutic activities;Therapeutic exercise;Balance training;Neuromuscular re-education;Cognitive remediation;Patient/family education    PT Goals (Current goals can be found in the Care Plan section)  Acute Rehab PT Goals Patient Stated Goal: get stronger and go home PT Goal Formulation: With patient/family Time For Goal Achievement: 12/19/20 Potential to Achieve Goals: Good    Frequency Min 2X/week   Barriers to discharge        Co-evaluation PT/OT/SLP Co-Evaluation/Treatment: Yes Reason for Co-Treatment: Necessary to address cognition/behavior during functional activity;For patient/therapist safety PT goals addressed during session: Mobility/safety with mobility;Strengthening/ROM;Balance OT goals addressed during session: ADL's and self-care;Strengthening/ROM       AM-PAC PT "6 Clicks" Mobility  Outcome Measure Help needed turning from your back to your side while in a flat bed without using bedrails?: A Lot Help needed moving from lying on your back to sitting on the side of a flat bed without using bedrails?: Total Help needed moving to and from a bed to a chair (including a wheelchair)?: Total Help needed standing up from a chair using your arms (e.g., wheelchair or bedside chair)?: Total Help needed to walk in hospital room?: Total Help needed climbing 3-5 steps with a  railing? : Total 6 Click Score: 7    End of Session Equipment Utilized  During Treatment: Oxygen Activity Tolerance: Patient limited by fatigue Patient left: in bed;with call bell/phone within reach;with bed alarm set;with family/visitor present (wife and sister in room) Nurse Communication: Mobility status PT Visit Diagnosis: Unsteadiness on feet (R26.81);Muscle weakness (generalized) (M62.81);Other abnormalities of gait and mobility (R26.89)    Time: 1400-1430 PT Time Calculation (min) (ACUTE ONLY): 30 min   Charges:   PT Evaluation $PT Eval Moderate Complexity: 1 Mod PT Treatments $Therapeutic Activity: 8-22 mins        Donna Bernard, PT, MPT   Ina Homes 12/05/2020, 3:45 PM

## 2020-12-05 NOTE — Progress Notes (Signed)
Nutrition Follow-up  DOCUMENTATION CODES:   Not applicable  INTERVENTION:   Ensure Enlive po TID, each supplement provides 350 kcal and 20 grams of protein  MVI, folic acid and thiamine po daily   NUTRITION DIAGNOSIS:   Inadequate oral intake related to social / environmental circumstances (etoh abuse, chronic poor appetite) as evidenced by per patient/family report.  GOAL:   Patient will meet greater than or equal to 90% of their needs  MONITOR:   PO intake, Supplement acceptance, Labs, Weight trends, Skin, I & O's  ASSESSMENT:   55 y/o male with h/o etoh abuse and COPD who is admitted with COPD exacerbation, etoh withdrawal and encephalopathy.  Pt s/p thoracentesis 9/28 with 1.2L output   Pt extubated today. Pt initiated on a regular diet today and is pending SLP evaluation. Pt continues to have confusion. RD will add supplements to help pt meet his estimated needs. Refeed labs stable. Per chart, pt is down ~19lbs(7%) since admission. RD unsure of pt's UBW as there is no documented weight history in chart.   Medications reviewed and include: folic acid, heparin, solu-medrol, MVI, oxycodone, protonix, thiamine  Labs reviewed: K 3.5 wnl, creat 0.54(L), P 3.1 wnl, Mg 1.9 wnl Wbc- 14.5(H)  Diet Order:   Diet Order             Diet regular Room service appropriate? Yes; Fluid consistency: Thin  Diet effective now                  EDUCATION NEEDS:   Not appropriate for education at this time  Skin:  Skin Assessment: Reviewed RN Assessment (Ecchymosis)  Last BM:  10/1  Height:   Ht Readings from Last 1 Encounters:  12/03/20 6\' 3"  (1.905 m)    Weight:   Wt Readings from Last 1 Encounters:  12/05/20 122.4 kg    BMI:  Body mass index is 33.73 kg/m.  Estimated Nutritional Needs:   Kcal:  2700-3000kcal/day  Protein:  >130g/day  Fluid:  2.7-3.0L/day  02/04/21 MS, RD, LDN Please refer to [facility] for RD and/or RD on-call/weekend/after hours  pager

## 2020-12-05 NOTE — Progress Notes (Signed)
ID Pt  extubated yesterday Awake, responding questions appropriately Says he is much better Some confusion  Patient Vitals for the past 24 hrs:  BP Temp Temp src Pulse Resp SpO2 Weight  12/05/20 1800 (!) 137/93 -- -- 95 (!) 22 96 % --  12/05/20 1700 (!) 152/94 -- -- 90 (!) 32 95 % --  12/05/20 1600 (!) 147/94 -- -- 100 (!) 22 94 % --  12/05/20 1500 (!) 141/85 -- -- (!) 103 18 96 % --  12/05/20 1400 -- -- -- 94 19 95 % --  12/05/20 1300 -- -- -- 95 15 97 % --  12/05/20 1200 -- -- -- 95 (!) 22 96 % --  12/05/20 1100 -- -- -- 90 13 94 % --  12/05/20 1000 -- -- -- 99 (!) 25 97 % --  12/05/20 0900 (!) 141/81 -- -- 87 (!) 28 92 % --  12/05/20 0800 (!) 147/87 98.6 F (37 C) Oral 89 17 91 % --  12/05/20 0700 (!) 143/87 -- -- 89 (!) 24 97 % --  12/05/20 0600 (!) 141/84 -- -- 82 (!) 21 92 % --  12/05/20 0500 (!) 145/90 -- -- 94 17 96 % --  12/05/20 0432 -- -- -- -- -- -- 122.4 kg  12/05/20 0400 135/83 98.8 F (37.1 C) Oral 96 (!) 25 100 % --  12/05/20 0300 -- -- -- 91 17 95 % --  12/05/20 0200 -- -- -- 90 (!) 23 92 % --  12/05/20 0100 (!) 140/91 -- -- 99 (!) 33 96 % --  12/05/20 0000 (!) 143/84 97.6 F (36.4 C) Oral 97 17 94 % --  12/04/20 2300 (!) 147/80 -- -- -- 18 -- --  12/04/20 2200 131/82 -- -- -- (!) 32 94 % --  12/04/20 2100 (!) 143/88 -- -- (!) 111 (!) 21 91 % --  12/04/20 2000 (!) 143/88 99.5 F (37.5 C) Oral (!) 105 (!) 21 94 % --  12/04/20 1900 (!) 145/91 -- -- (!) 102 (!) 23 (!) 86 % --    Awake, more alert Some restlessness- moving bed Oriented in place, person, knows his DOB, not oriented in time Chest b/l air entry [id number] Abd soft Rt PICC Foley removed  Labs CBC Latest Ref Rng & Units 12/05/2020 12/04/2020 12/03/2020  WBC 4.0 - 10.5 K/uL 14.5(H) 11.3(H) 10.1  Hemoglobin 13.0 - 17.0 g/dL 12.1(L) 11.0(L) 11.5(L)  Hematocrit 39.0 - 52.0 % 35.5(L) 33.9(L) 33.3(L)  Platelets 150 - 400 K/uL 543(H) 501(H) 474(H)    CMP Latest Ref Rng & Units 12/05/2020 12/04/2020  12/03/2020  Glucose 70 - 99 mg/dL 196(Q) 229(N) 989(Q)  BUN 6 - 20 mg/dL 15 11(H) 41(D)  Creatinine 0.61 - 1.24 mg/dL 4.08(X) 4.48(J) 8.56(D)  Sodium 135 - 145 mmol/L 137 139 138  Potassium 3.5 - 5.1 mmol/L 3.5 4.0 3.9  Chloride 98 - 111 mmol/L 98 96(L) 97(L)  CO2 22 - 32 mmol/L 30 34(H) 34(H)  Calcium 8.9 - 10.3 mg/dL 8.3(L) 8.4(L) 8.1(L)  Total Protein 6.5 - 8.1 g/dL - - -  Total Bilirubin 0.3 - 1.2 mg/dL - - -  Alkaline Phos 38 - 126 U/L - - -  AST 15 - 41 U/L - - -  ALT 0 - 44 U/L - - -    Micro 9/28- resp culture NG Pleural fluid NG 9/26 BC NG Imaging Mild vascular congestion- left pleural effusion  Impression/recommendation   Acute encephalopathy due to Dts is much improved Pt  is awake   Acute hypoxic resp failure - extubated on 12/04/20 Had left lung white put with pleural effusion  /collapse- underwent pleurocentesis and bronch on 11/30/20 - Culture neg- lung much better  Aspiration pneumonia - - completed 10 days of antibiotic Watch closely for further  aspiration as he is now extubated   Fever - central in origin due to Dts, resolved- afebrile for > 5 days All cultures neg  Mild leucocytosis- post extubation- recommend incentive spirometry, OOB in chair, PT  PT has a PICC line - remove as soon as possible  ID will sign off- call if needed

## 2020-12-05 NOTE — Consult Note (Signed)
PHARMACY CONSULT NOTE  Pharmacy Consult for Electrolyte Monitoring and Replacement   Recent Labs: Potassium (mmol/L)  Date Value  12/05/2020 3.5   Magnesium (mg/dL)  Date Value  39/53/2023 1.9   Calcium (mg/dL)  Date Value  34/35/6861 8.3 (L)   Albumin (g/dL)  Date Value  68/37/2902 2.0 (L)   Phosphorus (mg/dL)  Date Value  01/18/5207 3.1   Sodium (mmol/L)  Date Value  12/05/2020 137   Assessment: Patient is a 55 y/o M with medical history including EtOH abuse who presented to the ED 9/19 with chief complaint of SOB, dark colored sputum, back pain and thumb pain. While in the ED, patient began experiencing hallucinations and encephalopathy. Patient ultimately required intubation 9/19. Patient is now admitted to the ICU where he remains intubated, sedated, and on mechanical ventilation. Drinks 1.5 gallons of rum per week. Alcohol withdrawal on Versed infusion, Librium and phenobarbital per tube. Patient has now been extubated. Pharmacy consulted to assist with electrolyte monitoring and replacement as indicated.   Goal of Therapy:  Electrolytes within normal limits  Plan:  Received potassium 10 mEq IV x 4 runs this morning Patient to transfer to hospitalist 10/4  Pricilla Riffle, PharmD, BCPS Clinical Pharmacist 12/05/2020 1:06 PM

## 2020-12-06 DIAGNOSIS — J90 Pleural effusion, not elsewhere classified: Secondary | ICD-10-CM

## 2020-12-06 DIAGNOSIS — R531 Weakness: Secondary | ICD-10-CM

## 2020-12-06 DIAGNOSIS — J441 Chronic obstructive pulmonary disease with (acute) exacerbation: Secondary | ICD-10-CM

## 2020-12-06 DIAGNOSIS — J690 Pneumonitis due to inhalation of food and vomit: Secondary | ICD-10-CM

## 2020-12-06 LAB — CBC WITH DIFFERENTIAL/PLATELET
Abs Immature Granulocytes: 0.14 10*3/uL — ABNORMAL HIGH (ref 0.00–0.07)
Basophils Absolute: 0.1 10*3/uL (ref 0.0–0.1)
Basophils Relative: 1 %
Eosinophils Absolute: 0.2 10*3/uL (ref 0.0–0.5)
Eosinophils Relative: 1 %
HCT: 37.1 % — ABNORMAL LOW (ref 39.0–52.0)
Hemoglobin: 13 g/dL (ref 13.0–17.0)
Immature Granulocytes: 1 %
Lymphocytes Relative: 21 %
Lymphs Abs: 2.5 10*3/uL (ref 0.7–4.0)
MCH: 34.3 pg — ABNORMAL HIGH (ref 26.0–34.0)
MCHC: 35 g/dL (ref 30.0–36.0)
MCV: 97.9 fL (ref 80.0–100.0)
Monocytes Absolute: 0.9 10*3/uL (ref 0.1–1.0)
Monocytes Relative: 7 %
Neutro Abs: 8.1 10*3/uL — ABNORMAL HIGH (ref 1.7–7.7)
Neutrophils Relative %: 69 %
Platelets: 535 10*3/uL — ABNORMAL HIGH (ref 150–400)
RBC: 3.79 MIL/uL — ABNORMAL LOW (ref 4.22–5.81)
RDW: 12.3 % (ref 11.5–15.5)
WBC: 11.8 10*3/uL — ABNORMAL HIGH (ref 4.0–10.5)
nRBC: 0 % (ref 0.0–0.2)

## 2020-12-06 LAB — BASIC METABOLIC PANEL
Anion gap: 7 (ref 5–15)
BUN: 13 mg/dL (ref 6–20)
CO2: 31 mmol/L (ref 22–32)
Calcium: 8.7 mg/dL — ABNORMAL LOW (ref 8.9–10.3)
Chloride: 98 mmol/L (ref 98–111)
Creatinine, Ser: 0.72 mg/dL (ref 0.61–1.24)
GFR, Estimated: >60 mL/min (ref >60)
Glucose, Bld: 101 mg/dL — ABNORMAL HIGH (ref 70–99)
Potassium: 3.4 mmol/L — ABNORMAL LOW (ref 3.5–5.1)
Sodium: 136 mmol/L (ref 135–145)

## 2020-12-06 LAB — GLUCOSE, CAPILLARY: Glucose-Capillary: 99 mg/dL (ref 70–99)

## 2020-12-06 LAB — PHOSPHORUS: Phosphorus: 3.4 mg/dL (ref 2.5–4.6)

## 2020-12-06 LAB — PROCALCITONIN: Procalcitonin: <0.1 ng/mL

## 2020-12-06 LAB — MAGNESIUM: Magnesium: 1.8 mg/dL (ref 1.7–2.4)

## 2020-12-06 MED ORDER — THIAMINE HCL 100 MG PO TABS
100.0000 mg | ORAL_TABLET | Freq: Every day | ORAL | Status: DC
  Administered 2020-12-07 – 2020-12-12 (×6): 100 mg via ORAL
  Filled 2020-12-06 (×6): qty 1

## 2020-12-06 MED ORDER — PANTOPRAZOLE SODIUM 40 MG PO TBEC
40.0000 mg | DELAYED_RELEASE_TABLET | Freq: Every day | ORAL | Status: DC
  Administered 2020-12-06 – 2020-12-11 (×6): 40 mg via ORAL
  Filled 2020-12-06 (×6): qty 1

## 2020-12-06 MED ORDER — CHLORDIAZEPOXIDE HCL 25 MG PO CAPS
25.0000 mg | ORAL_CAPSULE | Freq: Three times a day (TID) | ORAL | Status: DC
  Administered 2020-12-06 – 2020-12-10 (×14): 25 mg via ORAL
  Filled 2020-12-06 (×15): qty 1

## 2020-12-06 MED ORDER — OXYCODONE HCL 5 MG PO TABS: 5.0000 mg | ORAL_TABLET | Freq: Three times a day (TID) | ORAL | Status: DC | PRN

## 2020-12-06 MED ORDER — QUETIAPINE FUMARATE 25 MG PO TABS
25.0000 mg | ORAL_TABLET | Freq: Every day | ORAL | Status: DC
  Administered 2020-12-06 – 2020-12-11 (×6): 25 mg via ORAL
  Filled 2020-12-06 (×6): qty 1

## 2020-12-06 MED ORDER — OXYCODONE HCL 5 MG PO TABS
5.0000 mg | ORAL_TABLET | Freq: Three times a day (TID) | ORAL | Status: DC
  Administered 2020-12-06 – 2020-12-07 (×2): 5 mg via ORAL
  Filled 2020-12-06 (×2): qty 1

## 2020-12-06 MED ORDER — PHENOBARBITAL 32.4 MG PO TABS
32.4000 mg | ORAL_TABLET | Freq: Once | ORAL | Status: AC
  Administered 2020-12-06: 32.4 mg via ORAL
  Filled 2020-12-06: qty 1

## 2020-12-06 MED ORDER — CHLORHEXIDINE GLUCONATE 0.12 % MT SOLN
15.0000 mL | Freq: Two times a day (BID) | OROMUCOSAL | Status: DC
  Administered 2020-12-06 – 2020-12-12 (×11): 15 mL via OROMUCOSAL
  Filled 2020-12-06 (×11): qty 15

## 2020-12-06 MED ORDER — PHENOBARBITAL 32.4 MG PO TABS
16.2000 mg | ORAL_TABLET | Freq: Two times a day (BID) | ORAL | Status: AC
  Administered 2020-12-07 (×2): 16.2 mg via ORAL
  Filled 2020-12-06 (×2): qty 1

## 2020-12-06 MED ORDER — ORAL CARE MOUTH RINSE
15.0000 mL | Freq: Two times a day (BID) | OROMUCOSAL | Status: DC
  Administered 2020-12-06 – 2020-12-10 (×8): 15 mL via OROMUCOSAL

## 2020-12-06 NOTE — Progress Notes (Signed)
Pt Alert, Oriented x 2. Hallucinates at times but is not agitated. Denies pain. PICC line removed when PIV placed. Follows commands. Weakness in all extremities, but has purposeful movement. Tolerating regular diet. NSR-low ST on the monitor. VSS. Report called to RN on 1A. Pt will be transported in the bed, tele discontinued.

## 2020-12-06 NOTE — Progress Notes (Signed)
Patient ID: Seth Harper, male   DOB: [birth year], 55 y.o.   MRN: [id number] [facility] PROGRESS NOTE  Seth Harper WUJ:[id number] DOB: 12-22-1965 DOA: 11/21/2020 PCP: Patient, No Pcp Per (Inactive)  HPI/Subjective: Patient feels okay.  Still feels weak.  Patient went into delirium tremens and was intubated and on the ICU team until 12/06/2020 and signed out to the medical team.  Admitted 15 days ago with alcohol withdrawal acute respiratory failure and systemic inflammatory response syndrome.  Objective: Vitals:   12/06/20 1000 12/06/20 1149  BP: (!) 142/81 (!) 147/107  Pulse: (!) 113 (!) 106  Resp: (!) 24 20  Temp:  (P) 99.7 F (37.6 C)  SpO2: 94% 93%    Intake/Output Summary (Last 24 hours) at 12/06/2020 1400 Last data filed at 12/06/2020 0600 Gross per 24 hour  Intake 240 ml  Output 1950 ml  Net -1710 ml   Filed Weights   12/04/20 0800 12/05/20 0432 12/06/20 0500  Weight: 124.7 kg 122.4 kg 122.2 kg    ROS: Review of Systems  Respiratory:  Negative for shortness of breath.   Cardiovascular:  Negative for chest pain.  Gastrointestinal:  Negative for abdominal pain, nausea and vomiting.  Exam: Physical Exam HENT:     Head: Normocephalic.     Mouth/Throat:     Pharynx: No oropharyngeal exudate.  Eyes:     General: Lids are normal.     Conjunctiva/sclera: Conjunctivae normal.  Cardiovascular:     Rate and Rhythm: Regular rhythm. Tachycardia present.     Heart sounds: Normal heart sounds, S1 normal and S2 normal.  Pulmonary:     Breath sounds: Examination of the right-lower field reveals decreased breath sounds. Examination of the left-lower field reveals decreased breath sounds. Decreased breath sounds present. No wheezing, rhonchi or rales.  Abdominal:     Palpations: Abdomen is soft.     Tenderness: There is no abdominal tenderness.  Musculoskeletal:     Right lower leg: Swelling present.     Left lower leg: Swelling present.  Skin:    General: Skin is  warm.     Findings: No rash.  Neurological:     Mental Status: He is alert.     Comments: Answers yes or no questions appropriately.  Able to straight leg raise bilaterally.      Scheduled Meds:  chlordiazePOXIDE  25 mg Oral TID   chlorhexidine  15 mL Mouth Rinse BID   Chlorhexidine Gluconate Cloth  6 each Topical Q0600   feeding supplement  237 mL Oral TID BM   folic acid  1 mg Oral Daily   heparin  5,000 Units Subcutaneous Q8H   ipratropium-albuterol  3 mL Nebulization Q6H   mouth rinse  15 mL Mouth Rinse q12n4p   multivitamin with minerals  1 tablet Oral Daily   oxyCODONE  5 mg Oral TID   pantoprazole  40 mg Oral QHS   phenobarbital  32.4 mg Oral Once   Followed by   Melene Muller ON 12/07/2020] phenobarbital  16.2 mg Oral BID   [START ON 12/07/2020] predniSONE  20 mg Oral Q breakfast   Followed by   Melene Muller ON 12/08/2020] predniSONE  10 mg Oral Q breakfast   Followed by   Melene Muller ON 12/09/2020] predniSONE  5 mg Oral Q breakfast   QUEtiapine  25 mg Oral QHS   sodium chloride flush  10-40 mL Intracatheter Q12H   [START ON 12/07/2020] thiamine  100 mg Oral Daily   Brief history. 55 year old man  with history of asthma presented with altered mental status, acute hypoxic respiratory failure, SIRS, lactic acidosis and L1 compression fracture.  Patient signed out to the medical service for 12/06/2020.  Physical therapy recommending rehab.  Infectious disease specialist stopped antibiotics after 10 days for aspiration pneumonia.  9/19 Intubated, admitted to the ICU.  LP unsuccessful in the ED.  CT MRI head and EEG negative 9/20 changed to PCV due to vent dyssynchrony, lactic acid improved 9/22: Persistent fevers, recollect Tracheal aspirate, Doppler US BLE, consult ID.  KUB concerning for ileus vs SBO 9/23: Ileus resolved, to go for Fluro guided LP and MRI Brain w/ contrast, ABX changed to Meropenem, Acyclovir added  9/24: Uneventful night, started Precedex to wean off propofol and Versed 9/25:  Agitated off midazolam, on Precedex, had to start some propofol.  Initiated through [facility]. 9/26 severe agitation, MRI BRAIN NEG, EEG WNL,  9/27 severe hypoxia, remains on vent 9/28 s/p bronch thick secretions, s/p US thoracentesis 1.2 L removed 9/29 lasix given 4.2 L of urine 9/30 severe agitation and severe resp distress 9/30 discussion for [facility], ENT consulted 10/1 severe hypoxia 12/05/20- patient passed SLP eating with help from wife. Speaking in full sentences.  PT/OT initiated.   Assessment/Plan:  Acute hypoxic hypercapnic respiratory failure.  Patient extubated on 12/05/2020.  Currently on nasal cannula.  Patient states he does not wear oxygen at home. COPD exacerbation.  On prednisone taper and nebulizer treatments. Delirium tremens with alcohol withdrawal on thiamine and multivitamin.  Start to taper Librium.  On phenobarbital taper. Aspiration pneumonia completed antibiotics.  Remove PICC line once able to get peripheral IV.  Patient had bronchoscopy. Pleural effusion.  Had thoracentesis done Weakness.  Physical therapy recommending rehab but without insurance may be difficult.  Will need to get stronger while here.     Code Status:     Code Status Orders  (From admission, onward)           Start     Ordered   11/21/20 1534  Full code  Continuous        11/21/20 1536           Code Status History     This patient has a current code status but no historical code status.      Family Communication: Spoke with wife on the phone Disposition Plan: Status is: Inpatient  Dispo: The patient is from: Home              Anticipated d/c is to: Home              Patient currently too weak to go home currently   Difficult to place patient.  No.  Consultants: Critical care specialist Infectious disease  Procedures: Intubation extubation Lumbar puncture Thoracentesis Bronchoscopy  Antibiotics: Completed  Time spent: 28 minutes  Seth Harper [employer]

## 2020-12-06 NOTE — NC FL2 (Signed)
[place] MEDICAID FL2 LEVEL OF CARE SCREENING TOOL     IDENTIFICATION  Patient Name: Seth Harper Birthdate: 03/17/1965 Sex: male Admission Date (Current Location): 11/21/2020  [facility] and [place] Number:  [place] Medicaid Potential Facility and Address:  [facility], [address], [place], [place] [address]      Provider Number: [id number]  Attending Physician Name and Address:  Alford Highland, MD  Relative Name and Phone Number:  Katina Degree (Significant other)   [phone] [facility])    Current Level of Care: Hospital Recommended Level of Care: Skilled Nursing Facility Prior Approval Number:    Date Approved/Denied:   PASRR Number: [id number]  Discharge Plan: SNF    Current Diagnoses: Patient Active Problem List   Diagnosis Date Noted   On mechanically assisted ventilation (HCC) 11/28/2020   Endotracheally intubated 11/28/2020   Acute encephalopathy 11/28/2020   Acute respiratory failure with hypoxia (HCC) 11/28/2020   CAP (community acquired pneumonia) 11/28/2020   Sepsis (HCC) 11/28/2020   Emphysema of lung (HCC) 11/28/2020   Severe malnutrition (HCC) 11/28/2020   Compression fracture of L1 vertebra (HCC) 11/28/2020   Alcohol withdrawal with delirium (HCC) 11/21/2020    Orientation RESPIRATION BLADDER Height & Weight     Self, Place, Situation  Normal Continent Weight: 269 lb 6.4 oz (122.2 kg) Height:  6\' 3"  (190.5 cm)  BEHAVIORAL SYMPTOMS/MOOD NEUROLOGICAL BOWEL NUTRITION STATUS      Continent Diet  AMBULATORY STATUS COMMUNICATION OF NEEDS Skin   Extensive Assist Verbally Normal                       Personal Care Assistance Level of Assistance  Bathing, Feeding, Dressing, Total care Bathing Assistance: Limited assistance Feeding assistance: Limited assistance Dressing Assistance: Limited assistance Total Care Assistance: Limited assistance   Functional Limitations Info  Sight, Speech, Hearing Sight  Info: Adequate Hearing Info: Adequate Speech Info: Adequate    SPECIAL CARE FACTORS FREQUENCY  PT (By licensed PT), OT (By licensed OT)     PT Frequency: 5X per week OT Frequency: 5X per week            Contractures Contractures Info: Not present    Additional Factors Info                  Current Medications (12/06/2020):  This is the current hospital active medication list Current Facility-Administered Medications  Medication Dose Route Frequency Provider Last Rate Last Admin   acetaminophen (TYLENOL) tablet 650 mg  650 mg Oral Q6H PRN Rust-Chester, Britton L, NP       albuterol (PROVENTIL) (2.5 MG/3ML) 0.083% nebulizer solution 2.5 mg  2.5 mg Nebulization Q4H PRN 02/05/2021, MD       chlordiazePOXIDE (LIBRIUM) capsule 25 mg  25 mg Oral TID Martina Sinner, MD       chlorhexidine (PERIDEX) 0.12 % solution 15 mL  15 mL Mouth Rinse BID Wieting, Richard, MD       Chlorhexidine Gluconate Cloth 2 % PADS 6 each  6 each Topical Q0600 Alford Highland, MD   6 each at 12/06/20 0841   feeding supplement (ENSURE ENLIVE / ENSURE PLUS) liquid 237 mL  237 mL Oral TID BM 02/05/21, MD   237 mL at 12/06/20 0841   folic acid (FOLVITE) tablet 1 mg  1 mg Oral Daily Rust-Chester, Britton L, NP   1 mg at 12/06/20 0839   heparin injection 5,000 Units  5,000 Units Subcutaneous Q8H Dewald,  Bettina Gavia, MD   5,000 Units at 12/06/20 0503   ipratropium-albuterol (DUONEB) 0.5-2.5 (3) MG/3ML nebulizer solution 3 mL  3 mL Nebulization Q6H Martina Sinner, MD   3 mL at 12/06/20 0729   MEDLINE mouth rinse  15 mL Mouth Rinse q12n4p Alford Highland, MD       multivitamin with minerals tablet 1 tablet  1 tablet Oral Daily Rust-Chester, Cecelia Byars, NP   1 tablet at 12/06/20 0838   ondansetron (ZOFRAN) injection 4 mg  4 mg Intravenous Q6H PRN Erin Fulling, MD   4 mg at 12/04/20 1400   oxyCODONE (Oxy IR/ROXICODONE) immediate release tablet 10 mg  10 mg Oral Q4H Erin Fulling, MD   10 mg at  12/06/20 1151   pantoprazole (PROTONIX) injection 40 mg  40 mg Intravenous QHS Martina Sinner, MD   40 mg at 12/05/20 2132   PHENobarbital (LUMINAL) tablet 32.4 mg  32.4 mg Oral Once Alford Highland, MD       Followed by   Melene Muller ON 12/07/2020] PHENobarbital (LUMINAL) tablet 16.2 mg  16.2 mg Oral BID Alford Highland, MD       Melene Muller ON 12/07/2020] predniSONE (DELTASONE) tablet 20 mg  20 mg Oral Q breakfast Vida Rigger, MD       Followed by   Melene Muller ON 12/08/2020] predniSONE (DELTASONE) tablet 10 mg  10 mg Oral Q breakfast Vida Rigger, MD       Followed by   Melene Muller ON 12/09/2020] predniSONE (DELTASONE) tablet 5 mg  5 mg Oral Q breakfast Vida Rigger, MD       QUEtiapine (SEROQUEL) tablet 25 mg  25 mg Oral QHS Wieting, Richard, MD       sodium chloride flush (NS) 0.9 % injection 10-40 mL  10-40 mL Intracatheter Q12H Kasa, Kurian, MD   10 mL at 12/06/20 0842   sodium chloride flush (NS) 0.9 % injection 10-40 mL  10-40 mL Intracatheter PRN Erin Fulling, MD       thiamine (B-1) injection 100 mg  100 mg Intravenous Daily Mannam, Praveen, MD   100 mg at 12/06/20 0840     Discharge Medications: Please see discharge summary for a list of discharge medications.  Relevant Imaging Results:  Relevant Lab Results:   Additional Information SS# [id number]  Joseph Art, LCSWA

## 2020-12-06 NOTE — Progress Notes (Signed)
Occupational Therapy Treatment Patient Details Name: Seth Harper MRN: [id number] DOB: [birth year] Today's Date: 12/06/2020   History of present illness Patient is a 55 year old male with alcohol dependence who presented to the ER on 9/19 with progressive shortness of breath, dark colored sputum, back pain and thumb pain. Patient started having hallucinations with increased altered mental status and was intubated. Patient with severe COPD exacerbation and severe acute hypoxic and hypercapnic Respiratory Failure. Patient extubated 10/3.   OT comments  Pt seen for co-tx with PT to address bed mobility and ADL transfers. Pt seated EOB with PT upon OT arrival, pt requiring significant physical assist and MOD VC for safety as he kept attempting to weight shift anteriorly in attempts to stand prior to therapists' instruction. Pt required MAX A  +2 for standing attempts and was briefly able to clear his buttocks from the bed but unable to maintain without total assist x2. Pt engaged in dynamic reaching activity requiring heavy assist to address sitting balance and more optimal posture to prevent a fall while seated EOB. Pt impulsive throughout session, but motivated to return to [facility] and appreciative of therapy efforts. Pt continues to benefit from skilled OT services, continue to recommend SNF for short term rehab upon discharge.    Recommendations for follow up therapy are one component of a multi-disciplinary discharge planning process, led by the attending physician.  Recommendations may be updated based on patient status, additional functional criteria and insurance authorization.    Follow Up Recommendations  SNF    Equipment Recommendations  3 in 1 bedside commode    Recommendations for Other Services      Precautions / Restrictions Precautions Precautions: Fall (compulsive) Precaution Comments: MD note indicates L1 compression fx Restrictions Weight Bearing Restrictions: No        Mobility Bed Mobility Overal bed mobility: Needs Assistance Bed Mobility: Sit to Supine     Supine to sit: Max assist;HOB elevated Sit to supine: Max assist;+2 for physical assistance   General bed mobility comments: supine to sitting with Max A of one person. verbal cues for sequencing and technique. increased time and effort required. patient needed increased assistance for return to bed due to fatigue after activity. he is impulsive with all activity, requiring contant cues for safety    Transfers Overall transfer level: Needs assistance   Transfers: Lateral/Scoot Transfers;Sit to/from Stand Sit to Stand: Max assist;+2 physical assistance        Lateral/Scoot Transfers: Max assist;+2 physical assistance General transfer comment: 2 bouts of sit to stand transfers attempted with seated rest breaks between bouts. patient was able to clear buttocks from bed on both bouts of standing, however was unable to stand fully due to weakness. improvement with weight acceptance in BLE with transfer efforts today.    Balance Overall balance assessment: Needs assistance;History of Falls Sitting-balance support: Bilateral upper extremity supported Sitting balance-Leahy Scale: Zero Sitting balance - Comments: patient was able to maintain sitting balance with brief preiods of Min guard assistance, however has multiple bouts of loss of balance in all directions. significant loss of balance to right and anteriorly, reqirinig up to 2 person assistance to correct to midline. patient is impulsive and leans too far forward frequently with minimal carry over of understanding of fall prevention. sitting tolerance of ~ 12 minutes     Standing balance-Leahy Scale: Zero  ADL either performed or assessed with clinical judgement   ADL                                               Vision       Perception     Praxis      Cognition  Arousal/Alertness: Awake/alert Behavior During Therapy: Impulsive;Restless Overall Cognitive Status: Impaired/Different from baseline Area of Impairment: Safety/judgement;Problem solving                         Safety/Judgement: Decreased awareness of safety;Decreased awareness of deficits   Problem Solving: Slow processing General Comments: patient with decreased awarenss of need for assistance and physical limitations. he is able to follow single step commands with extra time, repetition        Exercises Other Exercises Other Exercises: OT engaged pt in reaching laterally and anteriorly to work in trunk strength, activity tolerance, and dynamic sitting balance requiring significant assist for balance   Shoulder Instructions       General Comments RR up to 32 with exertion, HR up to 119, and SpO2 remained in 90's on 4L O2    Pertinent Vitals/ Pain       Pain Assessment: No/denies pain  Home Living                                          Prior Functioning/Environment              Frequency  Min 2X/week        Progress Toward Goals  OT Goals(current goals can now be found in the care plan section)  Progress towards OT goals: Progressing toward goals  Acute Rehab OT Goals Patient Stated Goal: to get stronger, get out of bed, go home OT Goal Formulation: With patient/family Time For Goal Achievement: 12/19/20 Potential to Achieve Goals: Good  Plan Discharge plan remains appropriate;Frequency remains appropriate    Co-evaluation    PT/OT/SLP Co-Evaluation/Treatment: Yes Reason for Co-Treatment: Necessary to address cognition/behavior during functional activity;For patient/therapist safety;To address functional/ADL transfers PT goals addressed during session: Mobility/safety with mobility;Balance;Strengthening/ROM OT goals addressed during session: ADL's and self-care;Strengthening/ROM      AM-PAC OT "6 Clicks" Daily Activity      Outcome Measure   Help from another person eating meals?: A Lot Help from another person taking care of personal grooming?: A Lot Help from another person toileting, which includes using toliet, bedpan, or urinal?: Total Help from another person bathing (including washing, rinsing, drying)?: A Lot Help from another person to put on and taking off regular upper body clothing?: A Lot Help from another person to put on and taking off regular lower body clothing?: A Lot 6 Click Score: 11    End of Session    OT Visit Diagnosis: Other abnormalities of gait and mobility (R26.89);Repeated falls (R29.6);Muscle weakness (generalized) (M62.81)   Activity Tolerance Patient tolerated treatment well   Patient Left in bed;with call bell/phone within reach;with bed alarm set;with family/visitor present   Nurse Communication Mobility status        Time: 7494-4967 OT Time Calculation (min): 15 min  Charges: OT General Charges $OT Visit: 1 Visit OT Treatments $Therapeutic Activity: 8-22 mins  Arman Filter., MPH,  MS, OTR/L ascom [phone] 12/06/20, 1:59 PM

## 2020-12-06 NOTE — TOC Progression Note (Signed)
Transition of Care [facility]) - Progression Note    Patient Details  Name: Brandon Wiechman MRN: [id number] Date of Birth: 10/26/1965  Transition of Care [facility]) CM/SW Contact  Joseph Art, [place] Phone Number: 12/06/2020, 11:59 AM  Clinical Narrative:     CSW spoke with patient's significant other Mallie Darting [phone] with update on PT/OT recommendations for SNF.  CSW explained to Ms. Brothers the barriers to placement in a SNF facility due to lack of insurance or ability to private pay for rehab.  CSW stated that I have already updated my supervisor on the patient's financial situation and the difficulties faced with care at home, because Ms. Steiner is the full-time care giver for the patient's mother.  CSW recommended Ms. Bothers contact DSS about Medicaid application.  Ms. Frederick Peers stated she would contact DSS today.  CSW provided contact information and stated I would contact Ms. Brothers once there were updates in discharge or placement.  Ms. Christin Fudge verbalized understanding.  Expected Discharge Plan: Skilled Nursing Facility Barriers to Discharge: Inadequate or no insurance, Continued Medical Work up  Expected Discharge Plan and Services Expected Discharge Plan: Skilled Nursing Facility In-house Referral: Clinical Social Work   Post Acute Care Choice: Skilled Nursing Facility Living arrangements for the past 2 months: Single Family Home                                       Social Determinants of Health (SDOH) Interventions    Readmission Risk Interventions No flowsheet data found.

## 2020-12-06 NOTE — Progress Notes (Signed)
Physical Therapy Treatment Patient Details Name: Seth Harper MRN: [id number] DOB: [birth year] Today's Date: 12/06/2020   History of Present Illness Patient is a 55 year old male with alcohol dependence who presented to the ER on 9/19 with progressive shortness of breath, dark colored sputum, back pain and thumb pain. Patient started having hallucinations with increased altered mental status and was intubated. Patient with severe COPD exacerbation and severe acute hypoxic and hypercapnic Respiratory Failure. Patient extubated 10/3.    PT Comments    Patient is agreeable to PT and is very motivated to regain independence. He is impulsive with all activity and requires frequent cues for safety and need for assistance with limited carryover of understanding. He has improved with transition from supine to sitting, only requiring one person assistance. Improved sitting tolerance today as patient sat up for 12 minutes, however he required up to 2 person assistance to correct to midline with anterior loss of balance and impulsively flexing trunk too far forward.  Sit to stand transfers attempted today and patient is able to clear hips from bed x 2 bouts with maximal assistance of 2 person. Patient also participated in LE exercises in bed after mobilizing. Vitals monitored throughout with increase in heart rate and respiration rate with exertional activity. Recommend to continue PT to maximize independence and facilitate return to prior level of function.     Recommendations for follow up therapy are one component of a multi-disciplinary discharge planning process, led by the attending physician.  Recommendations may be updated based on patient status, additional functional criteria and insurance authorization.  Follow Up Recommendations  SNF     Equipment Recommendations   (to be determined at next level of care)    Recommendations for Other Services       Precautions / Restrictions  Precautions Precautions: Fall (impulsive) Precaution Comments: MD note indicates L1 compression fx Restrictions Weight Bearing Restrictions: No     Mobility  Bed Mobility Overal bed mobility: Needs Assistance Bed Mobility: Supine to Sit;Sit to Supine     Supine to sit: Max assist;HOB elevated Sit to supine: Max assist;+2 for physical assistance   General bed mobility comments: supine to sitting with Max A of one person. verbal cues for sequencing and technique. increased time and effort required. patient needed increased assistance for return to bed due to fatigue after activity. he is impulsive with all activity, requiring contant cues for safety    Transfers Overall transfer level: Needs assistance   Transfers: Lateral/Scoot Transfers;Sit to/from Stand Sit to Stand: Max assist;+2 physical assistance        Lateral/Scoot Transfers: Max assist;+2 physical assistance General transfer comment: 2 bouts of sit to stand transfers attempted with seated rest breaks between bouts. patient was able to clear buttocks from bed on both bouts of standing, however was unable to stand fully due to weakness. improvement with weight acceptance in BLE with transfer efforts today.  Ambulation/Gait             General Gait Details: unable to safely attempt at this time   Stairs             Wheelchair Mobility    Modified Rankin (Stroke Patients Only)       Balance Overall balance assessment: Needs assistance;History of Falls Sitting-balance support: Bilateral upper extremity supported Sitting balance-Leahy Scale: Zero Sitting balance - Comments: patient was able to maintain sitting balance with brief preiods of Min guard assistance, however has multiple bouts of loss of balance in  all directions. significant loss of balance to right and anteriorly, reqirinig up to 2 person assistance to correct to midline. patient is impulsive and leans too far forward frequently with minimal  carry over of understanding of fall prevention. sitting tolerance of ~ 12 minutes                                    Cognition Arousal/Alertness: Awake/alert Behavior During Therapy: Impulsive Overall Cognitive Status: Impaired/Different from baseline                                 General Comments: patient with decreased awarenss of need for assistance and physical limitations. he is able to follow single step commands with extra time, repetition      Exercises General Exercises - Lower Extremity Ankle Circles/Pumps: AROM;Strengthening;Both;10 reps;Seated;Supine (performed in sitting and supine) Heel Slides: AAROM;Strengthening;Both;10 reps;Supine Hip ABduction/ADduction: AAROM;Strengthening;Both;10 reps;Supine Straight Leg Raises: AAROM;Strengthening;Both;10 reps;Supine Other Exercises Other Exercises: verbal and visual cues for exercise technique for strengthening of LE    General Comments General comments (skin integrity, edema, etc.): patient with increased respiration rate with activity, up to 32. heart rate also increased to 119bpm with activity. Sp02 remains in the 90's on 4 L02 during activity.      Pertinent Vitals/Pain Pain Assessment: No/denies pain    Home Living                      Prior Function            PT Goals (current goals can now be found in the care plan section) Acute Rehab PT Goals Patient Stated Goal: to get stronger, get out of bed, go home PT Goal Formulation: With patient Time For Goal Achievement: 12/19/20 Potential to Achieve Goals: Good Progress towards PT goals: Progressing toward goals    Frequency    Min 2X/week      PT Plan Current plan remains appropriate    Co-evaluation PT/OT/SLP Co-Evaluation/Treatment: Yes (partial co-treat performed) Reason for Co-Treatment: Necessary to address cognition/behavior during functional activity;For patient/therapist safety;To address functional/ADL  transfers PT goals addressed during session: Mobility/safety with mobility;Balance;Strengthening/ROM OT goals addressed during session: ADL's and self-care;Strengthening/ROM      AM-PAC PT "6 Clicks" Mobility   Outcome Measure  Help needed turning from your back to your side while in a flat bed without using bedrails?: A Lot Help needed moving from lying on your back to sitting on the side of a flat bed without using bedrails?: Total Help needed moving to and from a bed to a chair (including a wheelchair)?: Total Help needed standing up from a chair using your arms (e.g., wheelchair or bedside chair)?: Total Help needed to walk in hospital room?: Total Help needed climbing 3-5 steps with a railing? : Total 6 Click Score: 7    End of Session Equipment Utilized During Treatment: Oxygen Activity Tolerance: Patient limited by fatigue;Patient tolerated treatment well Patient left: in bed;with call bell/phone within reach;with bed alarm set;with SCD's reapplied Nurse Communication: Mobility status PT Visit Diagnosis: Unsteadiness on feet (R26.81);Muscle weakness (generalized) (M62.81);Other abnormalities of gait and mobility (R26.89)     Time: 1050-1120 PT Time Calculation (min) (ACUTE ONLY): 30 min  Charges:  $Therapeutic Exercise: 8-22 mins $Therapeutic Activity: 8-22 mins  Donna Bernard, PT, MPT    Ina Homes 12/06/2020, 11:39 AM

## 2020-12-07 LAB — BASIC METABOLIC PANEL
Anion gap: 10 (ref 5–15)
BUN: 13 mg/dL (ref 6–20)
CO2: 28 mmol/L (ref 22–32)
Calcium: 8.8 mg/dL — ABNORMAL LOW (ref 8.9–10.3)
Chloride: 100 mmol/L (ref 98–111)
Creatinine, Ser: 0.67 mg/dL (ref 0.61–1.24)
GFR, Estimated: >60 mL/min (ref >60)
Glucose, Bld: 100 mg/dL — ABNORMAL HIGH (ref 70–99)
Potassium: 3.5 mmol/L (ref 3.5–5.1)
Sodium: 138 mmol/L (ref 135–145)

## 2020-12-07 LAB — PHOSPHORUS: Phosphorus: 3.5 mg/dL (ref 2.5–4.6)

## 2020-12-07 LAB — MAGNESIUM: Magnesium: 2 mg/dL (ref 1.7–2.4)

## 2020-12-07 NOTE — Progress Notes (Signed)
[facility]  - [place] [facility]   PATIENT NAME: Seth Harper    MR#:  [id number]  DATE OF BIRTH:  1965/12/01  SUBJECTIVE:   patient sitting up in bed. Sister at bedside. Continues to have intermittent hallucinations although answers most questions appropriately. Trying to feed himself. Ate some [employer] sandwich brought by sister. REVIEW OF SYSTEMS:   Review of Systems  Constitutional:  Negative for chills, fever and weight loss.  HENT:  Negative for ear discharge, ear pain and nosebleeds.   Eyes:  Negative for blurred vision, pain and discharge.  Respiratory:  Negative for sputum production, shortness of breath, wheezing and stridor.   Cardiovascular:  Negative for chest pain, palpitations, orthopnea and PND.  Gastrointestinal:  Negative for abdominal pain, diarrhea, nausea and vomiting.  Genitourinary:  Negative for frequency and urgency.  Musculoskeletal:  Negative for back pain and joint pain.  Neurological:  Negative for sensory change, speech change, focal weakness and weakness.  Psychiatric/Behavioral:  Positive for hallucinations. Negative for depression. The patient is not nervous/anxious.   Tolerating Diet:yes Tolerating PT: recommends rehab  DRUG ALLERGIES:  No Known Allergies  VITALS:  Blood pressure 129/83, pulse (!) 101, temperature 97.6 F (36.4 C), temperature source Oral, resp. rate 19, height 6\' 3"  (1.905 m), weight 122.2 kg, SpO2 94 %.  PHYSICAL EXAMINATION:   Physical Exam  GENERAL:  55 y.o.-year-old patient lying in the bed with no acute distress.  HEENT: Head atraumatic, normocephalic. Oropharynx and nasopharynx clear.  NECK:  Supple, no jugular venous distention. No thyroid enlargement, no tenderness.  LUNGS: Normal breath sounds bilaterally, no wheezing, rales, rhonchi. No use of accessory muscles of respiration.  CARDIOVASCULAR: S1, S2 normal. No murmurs, rubs, or gallops.  ABDOMEN: Soft, nontender, nondistended. Bowel  sounds present. No organomegaly or mass.  EXTREMITIES: No cyanosis, clubbing or edema b/l.    NEUROLOGIC: Cranial nerves II through XII are intact. No focal Motor or sensory deficits b/l.   PSYCHIATRIC:  patient is alert and oriented x 2 SKIN: No obvious rash, lesion, or ulcer.   LABORATORY PANEL:  CBC Recent Labs  Lab 12/06/20 0505  WBC 11.8*  HGB 13.0  HCT 37.1*  PLT 535*    Chemistries  Recent Labs  Lab 12/07/20 0616  NA 138  K 3.5  CL 100  CO2 28  GLUCOSE 100*  BUN 13  CREATININE 0.67  CALCIUM 8.8*  MG 2.0   Cardiac Enzymes No results for input(s): TROPONINI in the last 168 hours. RADIOLOGY:  No results found. ASSESSMENT AND PLAN:  Seth Harper is a 55 year old male with history of alcohol abuse/dependence who presented to the ER on 9/19 with progressive shortness of breath, dark colored sputum, back pain and thumb pain. He started to have hallucinations with increasing altered mentation while in the ER. Per pt's family usually drinks 1.5 gallons of rum per week and his last drink was on Friday prior to the fall. Due to his increasing agitation and altered mentation he was sedated and intubated in the ER.  Acute hypoxic hypercapnic respiratory failure.  Patient extubated on 12/05/2020.  Currently on nasal cannula.  Patient states he does not wear oxygen at home. --wean as able to pt off oxygen  COPD exacerbation.  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   --Start to taper Librium.  On phenobarbital taper.  Aspiration pneumonia completed antibiotics.  Removed PICC line --Patient  had bronchoscopy.  Pleural effusion.  Had thoracentesis done 1.2 liter fluid removed  Weakness.  Physical therapy recommending rehab but without insurance may be difficult.  Will need to get stronger while here.    Procedures: Family communication :sister vickie in  the room Consults : CODE STATUS: full DVT Prophylaxis :heparin Level of care: Med-Surg Status is: Inpatient  Remains inpatient appropriate because:Unsafe d/c plan Unsafe d/c plan and  Dispo: The patient is from: Home              Anticipated d/c is to:  TBD              Patient currently is medically stable to d/c.   Difficult to place patient YES         TOTAL TIME TAKING CARE OF THIS PATIENT: 25 minutes.  >50% time spent on counselling and coordination of care  Note: This dictation was prepared with Dragon dictation along with smaller phrase technology. Any transcriptional errors that result from this process are unintentional.  Enedina Finner M.D    [facility]   CC: Primary care physician; Patient, No Pcp Per (Inactive) Patient ID: Seth Harper, male   DOB: 03-10-1965, 55 y.o.   MRN: [id number]

## 2020-12-07 NOTE — Progress Notes (Signed)
Physical Therapy Treatment Patient Details Name: Seth Harper MRN: [id number] DOB: 06/21/1965 Today's Date: 12/07/2020   History of Present Illness Patient is a 55 year old male with alcohol dependence who presented to the ER on 9/19 with progressive shortness of breath, dark colored sputum, back pain and thumb pain. Patient started having hallucinations with increased altered mental status and was intubated. Patient with severe COPD exacerbation and severe acute hypoxic and hypercapnic Respiratory Failure. Patient extubated 10/3.    PT Comments    Pt seen this am, received in bed on RA, no SOB, requesting to use bathroom.  Pt assisted to EOB with ModA, HOB raised, and use of side rail. Sitting EOB x 8 minutes with MinA at times to prevent anterior/lateral LOB. Pt has impaired motor control throughout and is very ataxic. Pt transferred sit to stand from raised bed to RW with MaxA. Unable to attain upright stance due to B LE weakness.  Unable to safely transfer to [facility], pt assisted back to bed with MaxA and placed on bed pan without success. Nursing notified of session. Pt pleasant throughout treatment, following commands well, very ataxic. Continue to recommend SNF upon d/c.   Recommendations for follow up therapy are one component of a multi-disciplinary discharge planning process, led by the attending physician.  Recommendations may be updated based on patient status, additional functional criteria and insurance authorization.  Follow Up Recommendations  SNF     Equipment Recommendations       Recommendations for Other Services       Precautions / Restrictions Precautions Precautions: Fall Precaution Comments: MD note indicates L1 compression fx Restrictions Weight Bearing Restrictions: No     Mobility  Bed Mobility Overal bed mobility: Needs Assistance Bed Mobility: Sit to Supine     Supine to sit: Mod assist;HOB elevated Sit to supine: Max assist (use of side rail)         Transfers Overall transfer level: Needs assistance Equipment used: Rolling walker (2 wheeled) Transfers: Sit to/from Stand Sit to Stand: Max assist;From elevated surface        Lateral/Scoot Transfers: Mod assist General transfer comment: Pt unable to attain upright stance posture at RW due to weakness in LE's.  Ambulation/Gait             General Gait Details: unable to safely attempt at this time   Stairs             Wheelchair Mobility    Modified Rankin (Stroke Patients Only)       Balance Overall balance assessment: Needs assistance;History of Falls Sitting-balance support: Bilateral upper extremity supported Sitting balance-Leahy Scale: Poor Sitting balance - Comments:  (Pt sat EOB with B UE support. Occassional MinA to prevent forward/lateral LOB due to decreased motor contol throughout)   Standing balance support: Bilateral upper extremity supported Standing balance-Leahy Scale: Poor Standing balance comment:  (Static standing RW with MaxA, unable to extend at knees and hips to attain upright standing)                            Cognition Arousal/Alertness: Awake/alert Behavior During Therapy: Impulsive (Ataxic) Overall Cognitive Status: Impaired/Different from baseline Area of Impairment: Safety/judgement;Problem solving                 Orientation Level: Disoriented to;Time     Following Commands: Follows one step commands with increased time Safety/Judgement: Decreased awareness of safety;Decreased awareness of deficits   Problem  Solving: Slow processing General Comments: patient with decreased awarenss of need for assistance and physical limitations. he is able to follow single step commands with extra time, repetition      Exercises      General Comments General comments (skin integrity, edema, etc.): Attempted transfer to [facility] yet unsafe to attempt this am      Pertinent Vitals/Pain Pain Assessment: No/denies  pain    Home Living                      Prior Function            PT Goals (current goals can now be found in the care plan section) Acute Rehab PT Goals Patient Stated Goal: to get stronger, get out of bed, go home    Frequency    Min 2X/week      PT Plan Current plan remains appropriate    Co-evaluation              AM-PAC PT "6 Clicks" Mobility   Outcome Measure  Help needed turning from your back to your side while in a flat bed without using bedrails?: A Lot Help needed moving from lying on your back to sitting on the side of a flat bed without using bedrails?: A Lot Help needed moving to and from a bed to a chair (including a wheelchair)?: Total Help needed standing up from a chair using your arms (e.g., wheelchair or bedside chair)?: Total Help needed to walk in hospital room?: Total Help needed climbing 3-5 steps with a railing? : Total 6 Click Score: 8    End of Session Equipment Utilized During Treatment: Gait belt Activity Tolerance:  (Pt fatigued after session.) Patient left: in bed;with call bell/phone within reach;with bed alarm set Nurse Communication: Mobility status PT Visit Diagnosis: Unsteadiness on feet (R26.81);Muscle weakness (generalized) (M62.81);Other abnormalities of gait and mobility (R26.89)     Time: 3614-4315 PT Time Calculation (min) (ACUTE ONLY): 32 min  Charges:  $Therapeutic Activity: 23-37 mins                    Zadie Cleverly, PTA    Jannet Askew 12/07/2020, 11:20 AM

## 2020-12-08 LAB — PHOSPHORUS: Phosphorus: 3.7 mg/dL (ref 2.5–4.6)

## 2020-12-08 LAB — MAGNESIUM: Magnesium: 1.9 mg/dL (ref 1.7–2.4)

## 2020-12-08 MED ORDER — SENNOSIDES-DOCUSATE SODIUM 8.6-50 MG PO TABS
1.0000 | ORAL_TABLET | Freq: Two times a day (BID) | ORAL | Status: DC | PRN
  Administered 2020-12-08: 1 via ORAL
  Filled 2020-12-08: qty 1

## 2020-12-08 MED ORDER — HALOPERIDOL LACTATE 5 MG/ML IJ SOLN
2.5000 mg | Freq: Once | INTRAMUSCULAR | Status: AC
  Administered 2020-12-08: 2.5 mg via INTRAVENOUS
  Filled 2020-12-08: qty 1

## 2020-12-08 MED ORDER — POLYETHYLENE GLYCOL 3350 17 G PO PACK
17.0000 g | PACK | Freq: Every day | ORAL | Status: DC
  Administered 2020-12-08 – 2020-12-11 (×3): 17 g via ORAL
  Filled 2020-12-08 (×5): qty 1

## 2020-12-08 NOTE — Progress Notes (Signed)
OT Cancellation Note  Patient Details Name: Seth Harper MRN: [id number] DOB: September 30, 1965   Cancelled Treatment:    Reason Eval/Treat Not Completed: Other (comment). Upon attempt, pt finishing up sponge bath with nursing. Will give pt time to rest and recovery and OT will re-attempt after lunch for tx.   Arman Filter., MPH, MS, OTR/L ascom [phone] 12/08/20, 10:57 AM

## 2020-12-08 NOTE — Progress Notes (Signed)
Occupational Therapy Treatment Patient Details Name: Seth Harper MRN: [id number] DOB: [birth year] Today's Date: 12/08/2020   History of present illness Patient is a 55 year old male with alcohol dependence who presented to the ER on 9/19 with progressive shortness of breath, dark colored sputum, back pain and thumb pain. Patient started having hallucinations with increased altered mental status and was intubated. Patient with severe COPD exacerbation and severe acute hypoxic and hypercapnic Respiratory Failure. Patient extubated 10/3.   OT comments  Pt seen for OT tx this date, eager to participate. Pt required MOD A for trunk elevation to come sit EOB. Pt donned socks using figure 4 technique from bed level requiring MIN A for initiating donning over toes. Pt demonstrating improved sitting balance requiring supervision to PRN MIN A. Pt performed lateral scoots with Min A-Mod A prior to standing attempts. Pt tolerated standing x3 attempts. First 2 attempts requiring MAX A +RW, 3rd attempt near end of session requiring MOD A +2 to complete SPT to recliner. Knees blocked to minimize buckling 2/2 weakness. HR up to high 120's with exertion, SpO2 >91-95% throughout session on room air. Pt instructed in PLB with exertion to support recovery. Pt able to demo proper technique with intermittent VC to initiate. Pt progressing well, continues to benefit from skilled OT services. Continue to recommend SNF at this time.     Recommendations for follow up therapy are one component of a multi-disciplinary discharge planning process, led by the attending physician.  Recommendations may be updated based on patient status, additional functional criteria and insurance authorization.    Follow Up Recommendations  SNF    Equipment Recommendations  3 in 1 bedside commode    Recommendations for Other Services      Precautions / Restrictions Precautions Precautions: Fall Precaution Comments: MD note indicates L1  compression fx Restrictions Weight Bearing Restrictions: No       Mobility Bed Mobility Overal bed mobility: Needs Assistance Bed Mobility: Supine to Sit     Supine to sit: Min assist;Mod assist;HOB elevated     General bed mobility comments: Min-Mod A for trunk elevation to complete sup>sit EOB    Transfers Overall transfer level: Needs assistance Equipment used: Rolling walker (2 wheeled) Transfers: Sit to/from Stand Sit to Stand: Max assist;+2 physical assistance;Mod assist        Lateral/Scoot Transfers: Mod assist;From elevated surface General transfer comment: MAX A + RW for first 2 attempts, knee blocking required, max VC for hand placement. MOD A +2 + RW on 3rd attempt with SPT to recliner    Balance Overall balance assessment: Needs assistance;History of Falls Sitting-balance support: Bilateral upper extremity supported Sitting balance-Leahy Scale: Poor     Standing balance support: Bilateral upper extremity supported Standing balance-Leahy Scale: Poor                             ADL either performed or assessed with clinical judgement   ADL Overall ADL's : Needs assistance/impaired                     Lower Body Dressing: Bed level;Minimal assistance Lower Body Dressing Details (indicate cue type and reason): Pt able to open grippy socks package and initiate donning from bed level using figure 4 technique, ultimately requiring VC for repositioning sock/problem solving and then Min A to initiate donning over toes             Functional mobility  during ADLs: Cueing for sequencing;Rolling walker;Moderate assistance;+2 for physical assistance       Vision       Perception     Praxis      Cognition Arousal/Alertness: Awake/alert Behavior During Therapy: Impulsive Overall Cognitive Status: Impaired/Different from baseline Area of Impairment: Safety/judgement;Problem solving                 Orientation Level:  Disoriented to;Situation     Following Commands: Follows one step commands with increased time;Follows multi-step commands inconsistently Safety/Judgement: Decreased awareness of safety;Decreased awareness of deficits   Problem Solving: Slow processing;Requires verbal cues;Difficulty sequencing General Comments: Pt continues to require cues for safety, but does demonstrate improved safety awareness requiring less VC        Exercises     Shoulder Instructions       General Comments      Pertinent Vitals/ Pain       Pain Assessment: No/denies pain  Home Living                                          Prior Functioning/Environment              Frequency  Min 2X/week        Progress Toward Goals  OT Goals(current goals can now be found in the care plan section)  Progress towards OT goals: Progressing toward goals  Acute Rehab OT Goals Patient Stated Goal: to get stronger, get out of bed, go home OT Goal Formulation: With patient/family Time For Goal Achievement: 12/19/20 Potential to Achieve Goals: Good  Plan Discharge plan remains appropriate;Frequency remains appropriate    Co-evaluation                 AM-PAC OT "6 Clicks" Daily Activity     Outcome Measure   Help from another person eating meals?: A Little Help from another person taking care of personal grooming?: A Lot Help from another person toileting, which includes using toliet, bedpan, or urinal?: Total Help from another person bathing (including washing, rinsing, drying)?: A Lot Help from another person to put on and taking off regular upper body clothing?: A Lot Help from another person to put on and taking off regular lower body clothing?: A Lot 6 Click Score: 12    End of Session Equipment Utilized During Treatment: Gait belt;Rolling walker  OT Visit Diagnosis: Other abnormalities of gait and mobility (R26.89);Repeated falls (R29.6);Muscle weakness (generalized)  (M62.81)   Activity Tolerance Patient tolerated treatment well   Patient Left with call bell/phone within reach;with family/visitor present;in chair;with chair alarm set   Nurse Communication Mobility status        Time: 2774-1287 OT Time Calculation (min): 39 min  Charges: OT General Charges $OT Visit: 1 Visit OT Treatments $Self Care/Home Management : 38-52 mins  Arman Filter., MPH, MS, OTR/L ascom [phone] 12/08/20, 1:53 PM

## 2020-12-08 NOTE — Progress Notes (Signed)
[facility]  - [place] at [facility]   PATIENT NAME: Seth Harper    MR#:  [id number]  DATE OF BIRTH:  02-Sep-1965  SUBJECTIVE:   patient more coherent and able to hold up meaningful conversation Able to feed himself Working alternately with PT and OT  REVIEW OF SYSTEMS:   Review of Systems  Constitutional:  Negative for chills, fever and weight loss.  HENT:  Negative for ear discharge, ear pain and nosebleeds.   Eyes:  Negative for blurred vision, pain and discharge.  Respiratory:  Negative for sputum production, shortness of breath, wheezing and stridor.   Cardiovascular:  Negative for chest pain, palpitations, orthopnea and PND.  Gastrointestinal:  Negative for abdominal pain, diarrhea, nausea and vomiting.  Genitourinary:  Negative for frequency and urgency.  Musculoskeletal:  Negative for back pain and joint pain.  Neurological:  Negative for sensory change, speech change, focal weakness and weakness.  Psychiatric/Behavioral:  Positive for hallucinations. Negative for depression. The patient is not nervous/anxious.   Tolerating Diet:yes Tolerating PT: recommends rehab  DRUG ALLERGIES:  No Known Allergies  VITALS:  Blood pressure 127/83, pulse 97, temperature 98.1 F (36.7 C), resp. rate 18, height 6\' 3"  (1.905 m), weight 122.2 kg, SpO2 97 %.  PHYSICAL EXAMINATION:   Physical Exam  GENERAL:  55 y.o.-year-old patient lying in the bed with no acute distress.  HEENT: Head atraumatic, normocephalic. Oropharynx and nasopharynx clear.  NECK:  Supple, no jugular venous distention. No thyroid enlargement, no tenderness.  LUNGS: Normal breath sounds bilaterally, no wheezing, rales, rhonchi. No use of accessory muscles of respiration.  CARDIOVASCULAR: S1, S2 normal. No murmurs, rubs, or gallops.  ABDOMEN: Soft, nontender, nondistended. Bowel sounds present. No organomegaly or mass.  EXTREMITIES: No cyanosis, clubbing or edema b/l.    NEUROLOGIC: Cranial  nerves II through XII are intact. No focal Motor or sensory deficits b/l.  Ataxia+ PSYCHIATRIC:  patient is alert and oriented x 2 SKIN: No obvious rash, lesion, or ulcer.   LABORATORY PANEL:  CBC Recent Labs  Lab 12/06/20 0505  WBC 11.8*  HGB 13.0  HCT 37.1*  PLT 535*     Chemistries  Recent Labs  Lab 12/07/20 0616 12/08/20 0210  NA 138  --   K 3.5  --   CL 100  --   CO2 28  --   GLUCOSE 100*  --   BUN 13  --   CREATININE 0.67  --   CALCIUM 8.8*  --   MG 2.0 1.9    Cardiac Enzymes No results for input(s): TROPONINI in the last 168 hours. RADIOLOGY:  No results found. ASSESSMENT AND PLAN:  Seth Harper is a 55 year old male with history of alcohol abuse/dependence who presented to the ER on 9/19 with progressive shortness of breath, dark colored sputum, back pain and thumb pain. He started to have hallucinations with increasing altered mentation while in the ER. Per pt's family usually drinks 1.5 gallons of rum per week and his last drink was on Friday prior to the fall. Due to his increasing agitation and altered mentation he was sedated and intubated in the ER.  Acute hypoxic hypercapnic respiratory failure.  Patient extubated on 12/05/2020.  Currently on nasal cannula.  Patient states he does not wear oxygen at home. --wean as able to pt off oxygen  COPD exacerbation.  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   --  Start to taper Librium.  On phenobarbital taper. --pt's mentation is much better and improving daily  Aspiration pneumonia completed antibiotics.  Removed PICC line --Patient had bronchoscopy.  Pleural effusion.  Had thoracentesis done 1.2 liter fluid removed  Weakness.  Physical therapy recommending rehab but without insurance may be difficult.  Will need to get stronger while here.    Procedures: Family communication  :sister vickie in the room 12/09/2020 Consults : CODE STATUS: full DVT Prophylaxis :heparin Level of care: Med-Surg Status is: Inpatient  Remains inpatient appropriate because:Unsafe d/c plan Unsafe d/c plan and  Dispo: The patient is from: Home              Anticipated d/c is to:  TBD              Patient currently is medically stable to d/c.   Difficult to place patient YES         TOTAL TIME TAKING CARE OF THIS PATIENT: 25 minutes.  >50% time spent on counselling and coordination of care  Note: This dictation was prepared with Dragon dictation along with smaller phrase technology. Any transcriptional errors that result from this process are unintentional.  Seth Harper M.D    [facility]   CC: Primary care physician; Patient, No Pcp Per (Inactive) Patient ID: Seth Harper, male   DOB: December 10, 1965, 55 y.o.   MRN: [id number]

## 2020-12-09 LAB — GLUCOSE, CAPILLARY
Glucose-Capillary: 137 mg/dL — ABNORMAL HIGH (ref 70–99)
Glucose-Capillary: 94 mg/dL (ref 70–99)
Glucose-Capillary: 101 mg/dL — ABNORMAL HIGH (ref 70–99)
Glucose-Capillary: 99 mg/dL (ref 70–99)

## 2020-12-09 LAB — MAGNESIUM: Magnesium: 2 mg/dL (ref 1.7–2.4)

## 2020-12-09 LAB — PHOSPHORUS: Phosphorus: 3.9 mg/dL (ref 2.5–4.6)

## 2020-12-09 MED ORDER — BISACODYL 10 MG RE SUPP
10.0000 mg | Freq: Every day | RECTAL | Status: DC | PRN
  Administered 2020-12-09: 10 mg via RECTAL
  Filled 2020-12-09: qty 1

## 2020-12-09 NOTE — Progress Notes (Signed)
[facility]  - [facility]   PATIENT NAME: Seth Harper    MR#:  [id number]  DATE OF BIRTH:  1966/02/20  SUBJECTIVE:   patient more coherent and able to hold up meaningful conversation Able to feed himself Working alternately with PT and OT sat in the chair for several hours yesterday  REVIEW OF SYSTEMS:   Review of Systems  Constitutional:  Negative for chills, fever and weight loss.  HENT:  Negative for ear discharge, ear pain and nosebleeds.   Eyes:  Negative for blurred vision, pain and discharge.  Respiratory:  Negative for sputum production, shortness of breath, wheezing and stridor.   Cardiovascular:  Negative for chest pain, palpitations, orthopnea and PND.  Gastrointestinal:  Negative for abdominal pain, diarrhea, nausea and vomiting.  Genitourinary:  Negative for frequency and urgency.  Musculoskeletal:  Negative for back pain and joint pain.  Neurological:  Negative for sensory change, speech change, focal weakness and weakness.  Psychiatric/Behavioral:  Positive for hallucinations. Negative for depression. The patient is not nervous/anxious.   Tolerating Diet:yes Tolerating PT: recommends rehab  DRUG ALLERGIES:  No Known Allergies  VITALS:  Blood pressure 121/84, pulse 100, temperature 98.3 F (36.8 C), temperature source Oral, resp. rate 18, height 6\' 3"  (1.905 m), weight 122.2 kg, SpO2 94 %.  PHYSICAL EXAMINATION:   Physical Exam  GENERAL:  55 y.o.-year-old patient lying in the bed with no acute distress.  HEENT: Head atraumatic, normocephalic. Oropharynx and nasopharynx clear.  NECK:  Supple, no jugular venous distention. No thyroid enlargement, no tenderness.  LUNGS: Normal breath sounds bilaterally, no wheezing, rales, rhonchi. No use of accessory muscles of respiration.  CARDIOVASCULAR: S1, S2 normal. No murmurs, rubs, or gallops.  ABDOMEN: Soft, nontender, nondistended. Bowel sounds present. No organomegaly or mass.   EXTREMITIES: No cyanosis, clubbing or edema b/l.    NEUROLOGIC: Cranial nerves II through XII are intact. Weakness  Ataxia+ PSYCHIATRIC:  patient is alert and oriented x 2 SKIN: No obvious rash, lesion, or ulcer.   LABORATORY PANEL:  CBC Recent Labs  Lab 12/06/20 0505  WBC 11.8*  HGB 13.0  HCT 37.1*  PLT 535*     Chemistries  Recent Labs  Lab 12/07/20 0616 12/08/20 0210 12/09/20 0444  NA 138  --   --   K 3.5  --   --   CL 100  --   --   CO2 28  --   --   GLUCOSE 100*  --   --   BUN 13  --   --   CREATININE 0.67  --   --   CALCIUM 8.8*  --   --   MG 2.0   < > 2.0   < > = values in this interval not displayed.    Cardiac Enzymes No results for input(s): TROPONINI in the last 168 hours. RADIOLOGY:  No results found. ASSESSMENT AND PLAN:  Seth Harper is a 55 year old male with history of alcohol abuse/dependence who presented to the ER on 9/19 with progressive shortness of breath, dark colored sputum, back pain and thumb pain. He started to have hallucinations with increasing altered mentation while in the ER. Per pt's family usually drinks 1.5 gallons of rum per week and his last drink was on Friday prior to the fall. Due to his increasing agitation and altered mentation he was sedated and intubated in the ER.  Acute hypoxic hypercapnic respiratory failure.  Patient extubated on 12/05/2020.  Currently on  nasal cannula.  Patient states he does not wear oxygen at home. --wean as able to pt off oxygen  COPD exacerbation.  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   --Start to taper Librium.  On phenobarbital taper. --pt's mentation is much better and improving daily  Aspiration pneumonia completed antibiotics.  Removed PICC line --Patient had bronchoscopy.  Pleural effusion.  Had thoracentesis done 1.2 liter fluid removed  Weakness.   Physical therapy recommending rehab but without insurance may be difficult.  Will need to get stronger while here.  Discussed at length with patient's wife Revonda Standard at bedside. Life is frustrated about the situation regarding patient not able to be able to go to rehab. Explained that there is no charity rehab. Will set up charity physical therapy and provide equipment if able to for home. Asked wife to discuss with church members and friends and family to help around.    Procedures: Family communication :wife allision  in the room 12/09/2020 Consults : CODE STATUS: full DVT Prophylaxis :heparin Level of care: Med-Surg Status is: Inpatient  Remains inpatient appropriate because:Unsafe d/c plan Unsafe d/c plan and  Dispo: The patient is from: Home              Anticipated d/c is to:  TBD              Patient currently is medically stable to d/c.   Difficult to place patient YES         TOTAL TIME TAKING CARE OF THIS PATIENT: 25 minutes.  >50% time spent on counselling and coordination of care  Note: This dictation was prepared with Dragon dictation along with smaller phrase technology. Any transcriptional errors that result from this process are unintentional.  Enedina Finner M.D    [facility]   CC: Primary care physician; Patient, No Pcp Per (Inactive) Patient ID: Seth Harper, male   DOB: November 28, 1965, 55 y.o.   MRN: [id number]

## 2020-12-09 NOTE — Progress Notes (Signed)
Physical Therapy Treatment Patient Details Name: Seth Harper MRN: [id number] DOB: [birth year] Today's Date: 12/09/2020   History of Present Illness Patient is a 55 year old male with alcohol dependence who presented to the ER on 9/19 with progressive shortness of breath, dark colored sputum, back pain and thumb pain. Patient started having hallucinations with increased altered mental status and was intubated. Patient with severe COPD exacerbation and severe acute hypoxic and hypercapnic Respiratory Failure. Patient extubated 10/3.    PT Comments    Patient received in bed, he is agreeable to PT session. He reports he just got comfortable. Patient requires mod assist with sidelying to sit. Unable to problem solve how to raise trunk in bed. He requires min +2 assist for sit to stand and min +2 assist for ambulation with RW 20'. Poor safety awareness and poor problem solving skills regarding mobility. He will continue to benefit from skilled PT while here to improve functional mobility and safety for hopeful return home with family.    Recommendations for follow up therapy are one component of a multi-disciplinary discharge planning process, led by the attending physician.  Recommendations may be updated based on patient status, additional functional criteria and insurance authorization.  Follow Up Recommendations  SNF;Supervision for mobility/OOB     Equipment Recommendations  None recommended by PT    Recommendations for Other Services       Precautions / Restrictions Precautions Precautions: Fall Precaution Comments: MD note indicates L1 compression fx Restrictions Weight Bearing Restrictions: No     Mobility  Bed Mobility Overal bed mobility: Needs Assistance Bed Mobility: Supine to Sit     Supine to sit: Mod assist;HOB elevated     General bed mobility comments: Unable to problem solve how to raise trunk to seated position. Required mod assist    Transfers Overall  transfer level: Needs assistance Equipment used: Rolling walker (2 wheeled) Transfers: Sit to/from Stand Sit to Stand: Mod assist;+2 physical assistance;+2 safety/equipment;From elevated surface         General transfer comment: Mod +2 assist for sit to stand. Wide bos and elevated surface. Cues to bring feet in together once standing.  Ambulation/Gait Ambulation/Gait assistance: Min assist;+2 physical assistance;+2 safety/equipment Gait Distance (Feet): 20 Feet Assistive device: Rolling walker (2 wheeled) Gait Pattern/deviations: Step-through pattern;Decreased step length - right;Decreased step length - left;Shuffle;Trunk flexed Gait velocity: decr   General Gait Details: Requires assistance for safety and RW for ambulation although improved mobility. Required seated rest after 10 feet.   Stairs             Wheelchair Mobility    Modified Rankin (Stroke Patients Only)       Balance Overall balance assessment: Needs assistance;History of Falls Sitting-balance support: Feet supported Sitting balance-Leahy Scale: Fair Sitting balance - Comments: able to sit edge of bed without UE support, supervision   Standing balance support: Bilateral upper extremity supported;During functional activity Standing balance-Leahy Scale: Poor Standing balance comment: requires RW and min +2 assist                            Cognition Arousal/Alertness: Awake/alert Behavior During Therapy: Restless Overall Cognitive Status: No family/caregiver present to determine baseline cognitive functioning Area of Impairment: Safety/judgement;Problem solving                 Orientation Level: Disoriented to;Situation     Following Commands: Follows one step commands with increased time;Follows one step commands inconsistently  Safety/Judgement: Decreased awareness of safety;Decreased awareness of deficits   Problem Solving: Slow processing;Difficulty sequencing;Requires  verbal cues;Requires tactile cues General Comments: Requires VCs for safety, proper hand placement on walker and during transfers. Poor problem solving skills.      Exercises      General Comments        Pertinent Vitals/Pain Pain Assessment: No/denies pain    Home Living                      Prior Function            PT Goals (current goals can now be found in the care plan section) Acute Rehab PT Goals Patient Stated Goal: to get stronger, go home PT Goal Formulation: With patient Time For Goal Achievement: 12/19/20 Potential to Achieve Goals: Fair Progress towards PT goals: Progressing toward goals    Frequency    Min 2X/week      PT Plan Current plan remains appropriate    Co-evaluation              AM-PAC PT "6 Clicks" Mobility   Outcome Measure  Help needed turning from your back to your side while in a flat bed without using bedrails?: A Little Help needed moving from lying on your back to sitting on the side of a flat bed without using bedrails?: A Lot Help needed moving to and from a bed to a chair (including a wheelchair)?: A Lot Help needed standing up from a chair using your arms (e.g., wheelchair or bedside chair)?: A Lot Help needed to walk in hospital room?: A Lot Help needed climbing 3-5 steps with a railing? : A Lot 6 Click Score: 13    End of Session Equipment Utilized During Treatment: Gait belt Activity Tolerance: Patient limited by fatigue Patient left: in bed;with call bell/phone within reach;with bed alarm set Nurse Communication: Mobility status PT Visit Diagnosis: Unsteadiness on feet (R26.81);Muscle weakness (generalized) (M62.81);Difficulty in walking, not elsewhere classified (R26.2);History of falling (Z91.81)     Time: 6629-4765 PT Time Calculation (min) (ACUTE ONLY): 17 min  Charges:  $Gait Training: 8-22 mins                     Tanaisha Pittman, PT, GCS 12/09/20,1:39 PM

## 2020-12-09 NOTE — Progress Notes (Signed)
Occupational Therapy Treatment Patient Details Name: Seth Harper MRN: [id number] DOB: [birth year] Today's Date: 12/09/2020   History of present illness Patient is a 55 year old male with alcohol dependence who presented to the ER on 9/19 with progressive shortness of breath, dark colored sputum, back pain and thumb pain. Patient started having hallucinations with increased altered mental status and was intubated. Patient with severe COPD exacerbation and severe acute hypoxic and hypercapnic Respiratory Failure. Patient extubated 10/3.   OT comments  Pt seen for OT tx this date. Pt alert, endorses frustration, sharing with OT that staff came into his room at 1:30am this morning to get him up walking (unable to verify if accurate). Pt eager to return to baseline but does admit he needs to get stronger first. Pt performed bed mobility with Min-Mod A this date, an improvement from last session. Pt performed grooming tasks with set up and supervision while seated EOB. Active listening and encouragement provided to support pt. Pt performed lateral scoots EOB with SBA/CGA and increased time/effort, but no direct physical assist needed, another improvement from previous session. Pt progressing well towards goals, continues to benefit from skilled OT services in order to maximize safety/indep with ADL/mobility in order to eventually return home with spouse.    Recommendations for follow up therapy are one component of a multi-disciplinary discharge planning process, led by the attending physician.  Recommendations may be updated based on patient status, additional functional criteria and insurance authorization.    Follow Up Recommendations  SNF    Equipment Recommendations  3 in 1 bedside commode    Recommendations for Other Services      Precautions / Restrictions Precautions Precautions: Fall Precaution Comments: MD note indicates L1 compression fx Restrictions Weight Bearing Restrictions: No        Mobility Bed Mobility Overal bed mobility: Needs Assistance Bed Mobility: Supine to Sit;Sit to Supine     Supine to sit: Min assist;Mod assist;HOB elevated Sit to supine: Min assist   General bed mobility comments: Min-Mod A for trunk elevation to come fully to sitting EOB, Min A for shoulders/trunk back to bed    Transfers Overall transfer level: Needs assistance Equipment used: None Transfers: Lateral/Scoot Transfers         Lateral/Scoot Transfers: Supervision;Min guard General transfer comment: increased time/effort, but able to perform lateral scoots wihtout direct assist    Balance Overall balance assessment: Needs assistance;History of Falls Sitting-balance support: Feet supported;Single extremity supported;No upper extremity supported Sitting balance-Leahy Scale: Fair Sitting balance - Comments: supervision, no LOB, less impulsive                               ADL either performed or assessed with clinical judgement   ADL Overall ADL's : Needs assistance/impaired     Grooming: Sitting;Supervision/safety;Set up;Wash/dry face                                       Vision       Perception     Praxis      Cognition Arousal/Alertness: Awake/alert Behavior During Therapy: Restless;Agitated Overall Cognitive Status: No family/caregiver present to determine baseline cognitive functioning Area of Impairment: Safety/judgement;Problem solving                 Orientation Level: Disoriented to;Situation     Following Commands: Follows one step  commands with increased time;Follows one step commands inconsistently Safety/Judgement: Decreased awareness of safety;Decreased awareness of deficits   Problem Solving: Slow processing;Difficulty sequencing;Requires verbal cues;Requires tactile cues General Comments: mildly frustrated, pt stating that staff came in and woke him up at 1:30am to walk. Endorses not sleeping well  and frustrated about when he might get to leave here. Encouragement and active listening provided. Follows commands with cues for safety.        Exercises     Shoulder Instructions       General Comments      Pertinent Vitals/ Pain       Pain Assessment: No/denies pain  Home Living                                          Prior Functioning/Environment              Frequency  Min 2X/week        Progress Toward Goals  OT Goals(current goals can now be found in the care plan section)  Progress towards OT goals: Progressing toward goals  Acute Rehab OT Goals Patient Stated Goal: to get stronger, go home OT Goal Formulation: With patient/family Time For Goal Achievement: 12/19/20 Potential to Achieve Goals: Good  Plan Discharge plan remains appropriate;Frequency remains appropriate    Co-evaluation                 AM-PAC OT "6 Clicks" Daily Activity     Outcome Measure   Help from another person eating meals?: None Help from another person taking care of personal grooming?: A Little Help from another person toileting, which includes using toliet, bedpan, or urinal?: A Lot Help from another person bathing (including washing, rinsing, drying)?: A Lot Help from another person to put on and taking off regular upper body clothing?: A Lot Help from another person to put on and taking off regular lower body clothing?: A Lot 6 Click Score: 15    End of Session    OT Visit Diagnosis: Other abnormalities of gait and mobility (R26.89);Repeated falls (R29.6);Muscle weakness (generalized) (M62.81)   Activity Tolerance Patient tolerated treatment well   Patient Left in bed;with call bell/phone within reach;with bed alarm set   Nurse Communication          Time: 4481-8563 OT Time Calculation (min): 10 min  Charges: OT General Charges $OT Visit: 1 Visit OT Treatments $Self Care/Home Management : 8-22 mins  Arman Filter., MPH, MS,  OTR/L ascom [phone] 12/09/20, 4:59 PM

## 2020-12-09 NOTE — TOC Progression Note (Signed)
Transition of Care [facility]) - Progression Note    Patient Details  Name: Shahil Speegle MRN: [id number] Date of Birth: [birth year]  [facility] CM/SW Contact  Caryn Section, RN Phone Number: 12/09/2020, 2:49 PM  Clinical Narrative:   Patient alert and oriented x 2.  States he and his family are aware that he will not be eligible for Sn[facility] due to lack of insurance.  He states wife and mother can help him at home.    He states he believes he will need CPAP, discussed with MD, she will review.  Patient will need PCP to follow up with if he has CPAP.  TOC will follow for this need.    Expected Discharge Plan:  (TBD) Barriers to Discharge: Inadequate or no insurance, Continued Medical Work up  Expected Discharge Plan and Services Expected Discharge Plan:  (TBD) In-house Referral: Clinical Social Work   Post Acute Care Choice: Skilled Nursing Facility Living arrangements for the past 2 months: Single Family Home                                       Social Determinants of Health (SDOH) Interventions    Readmission Risk Interventions No flowsheet data found.

## 2020-12-10 LAB — GLUCOSE, CAPILLARY
Glucose-Capillary: 98 mg/dL (ref 70–99)
Glucose-Capillary: 109 mg/dL — ABNORMAL HIGH (ref 70–99)
Glucose-Capillary: 131 mg/dL — ABNORMAL HIGH (ref 70–99)

## 2020-12-10 LAB — MAGNESIUM: Magnesium: 1.9 mg/dL (ref 1.7–2.4)

## 2020-12-10 LAB — PHOSPHORUS: Phosphorus: 4.1 mg/dL (ref 2.5–4.6)

## 2020-12-10 NOTE — Progress Notes (Signed)
[facility]  - [place] at [facility]   PATIENT NAME: Seth Harper    MR#:  [id number]  DATE OF BIRTH:  [birth year]  SUBJECTIVE:   patient doing very well. He ambulated with physical therapy to the nurses station this morning. Will assist.  Working alternately with PT and OT sat in the chair for several hours yesterday  REVIEW OF SYSTEMS:   Review of Systems  Constitutional:  Negative for chills, fever and weight loss.  HENT:  Negative for ear discharge, ear pain and nosebleeds.   Eyes:  Negative for blurred vision, pain and discharge.  Respiratory:  Negative for sputum production, shortness of breath, wheezing and stridor.   Cardiovascular:  Negative for chest pain, palpitations, orthopnea and PND.  Gastrointestinal:  Negative for abdominal pain, diarrhea, nausea and vomiting.  Genitourinary:  Negative for frequency and urgency.  Musculoskeletal:  Negative for back pain and joint pain.  Neurological:  Negative for sensory change, speech change, focal weakness and weakness.  Psychiatric/Behavioral:  Positive for hallucinations. Negative for depression. The patient is not nervous/anxious.   Tolerating Diet:yes Tolerating PT: recommends rehab--but pt progressing well with PT  DRUG ALLERGIES:  No Known Allergies  VITALS:  Blood pressure 114/81, pulse (!) 106, temperature 98.3 F (36.8 C), temperature source Oral, resp. rate 14, height 6\' 3"  (1.905 m), weight 122.2 kg, SpO2 94 %.  PHYSICAL EXAMINATION:   Physical Exam  GENERAL:  55 y.o.-year-old patient lying in the bed with no acute distress.  HEENT: Head atraumatic, normocephalic. Oropharynx and nasopharynx clear.  NECK:  Supple, no jugular venous distention. No thyroid enlargement, no tenderness.  LUNGS: Normal breath sounds bilaterally, no wheezing, rales, rhonchi. No use of accessory muscles of respiration.  CARDIOVASCULAR: S1, S2 normal. No murmurs, rubs, or gallops.  ABDOMEN: Soft, nontender,  nondistended. Bowel sounds present. No organomegaly or mass.  EXTREMITIES: No cyanosis, clubbing or edema b/l.    NEUROLOGIC: Cranial nerves II through XII are intact. Weakness  Ataxia+ PSYCHIATRIC:  patient is alert and oriented x 2 SKIN: No obvious rash, lesion, or ulcer.   LABORATORY PANEL:  CBC Recent Labs  Lab 12/06/20 0505  WBC 11.8*  HGB 13.0  HCT 37.1*  PLT 535*     Chemistries  Recent Labs  Lab 12/07/20 0616 12/08/20 0210 12/10/20 0418  NA 138  --   --   K 3.5  --   --   CL 100  --   --   CO2 28  --   --   GLUCOSE 100*  --   --   BUN 13  --   --   CREATININE 0.67  --   --   CALCIUM 8.8*  --   --   MG 2.0   < > 1.9   < > = values in this interval not displayed.    Cardiac Enzymes No results for input(s): TROPONINI in the last 168 hours. RADIOLOGY:  No results found. ASSESSMENT AND PLAN:  Seth Harper is a 55 year old male with history of alcohol abuse/dependence who presented to the ER on 9/19 with progressive shortness of breath, dark colored sputum, back pain and thumb pain. He started to have hallucinations with increasing altered mentation while in the ER. Per pt's family usually drinks 1.5 gallons of rum per week and his last drink was on Friday prior to the fall. Due to his increasing agitation and altered mentation he was sedated and intubated in the ER.  Acute hypoxic hypercapnic  respiratory failure.  Patient extubated on 12/05/2020.  Currently on nasal cannula.  Patient states he does not wear oxygen at home. --wean as able to pt off oxygen  COPD exacerbation.  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   --Start to taper Librium.  On phenobarbital taper. --pt's mentation is much better and improving daily  Aspiration pneumonia completed antibiotics.  Removed PICC line --Patient had bronchoscopy.  Pleural effusion.  Had  thoracentesis done 1.2 liter fluid removed  Weakness.  Physical therapy recommending rehab but without insurance may be difficult.  Will need to get stronger while here.  Discussed at length with patient's wife Revonda Standard at bedside. Life is frustrated about the situation regarding patient not able to be able to go to rehab. Explained that there is no charity rehab. Will set up charity physical therapy and provide equipment if able to for home. Asked wife to discuss with church members and friends and family to help around.  10/8-- patient ambulated to the nurses station with minimal assist. Continue alternate PT OT on daily basis.  Discussed with wife on the phone hoping patient will discharged to home with charity home health.    Procedures: Family communication :wife allision   12/10/2020 Consults : CODE STATUS: full DVT Prophylaxis :heparin Level of care: Med-Surg Status is: Inpatient  Remains inpatient appropriate because:Unsafe d/c plan Unsafe d/c plan and  Dispo: The patient is from: Home              Anticipated d/c is to: likely monday              Patient currently is medically stable to d/c.   Difficult to place patient YES         TOTAL TIME TAKING CARE OF THIS PATIENT: 20 minutes.  >50% time spent on counselling and coordination of care  Note: This dictation was prepared with Dragon dictation along with smaller phrase technology. Any transcriptional errors that result from this process are unintentional.  Enedina Finner M.D    [facility]   CC: Primary care physician; Patient, No Pcp Per (Inactive) Patient ID: Seth Harper, male   DOB: 1965-06-15, 55 y.o.   MRN: [id number]

## 2020-12-10 NOTE — Progress Notes (Signed)
Occupational Therapy Treatment Patient Details Name: Seth Harper MRN: [id number] DOB: 08/23/1965 Today's Date: 12/10/2020   History of present illness Patient is a 54 year old male with alcohol dependence who presented to the ER on 9/19 with progressive shortness of breath, dark colored sputum, back pain and thumb pain. Patient started having hallucinations with increased altered mental status and was intubated. Patient with severe COPD exacerbation and severe acute hypoxic and hypercapnic Respiratory Failure. Patient extubated 10/3.   OT comments  Pt seen for OT tx this date to f/u re: safety with ADLs/ADL mobility. OT engages pt in seated UB ADLs sink-side including bathing while seated on BSC at sink, to wash hair. Pt requires MOD A, hand held assist bilaterally, and cues for safety with transfers and demos poor eccentric control with transfer back to bed at end of session. Will continue to follow acutely. Left with all needs met and in reach. Bed alarm set. Pt's CNA presenting to get blood sugar.   Recommendations for follow up therapy are one component of a multi-disciplinary discharge planning process, led by the attending physician.  Recommendations may be updated based on patient status, additional functional criteria and insurance authorization.    Follow Up Recommendations  SNF    Equipment Recommendations  3 in 1 bedside commode    Recommendations for Other Services      Precautions / Restrictions Precautions Precautions: Fall Precaution Comments: MD note indicates L1 compression fx Restrictions Weight Bearing Restrictions: No       Mobility Bed Mobility Overal bed mobility: Needs Assistance Bed Mobility: Supine to Sit;Sit to Supine     Supine to sit: Min assist;HOB elevated Sit to supine: Min assist;Mod assist        Transfers Overall transfer level: Needs assistance Equipment used: 1 person hand held assist Transfers: Sit to/from Stand Sit to Stand: Min  assist;Mod assist;From elevated surface         General transfer comment: cues for safety, poor eccentric control, essentially flops back to bed with OT supporting him to not slide off the edge    Balance Overall balance assessment: Needs assistance Sitting-balance support: Feet supported Sitting balance-Leahy Scale: Good     Standing balance support: Bilateral upper extremity supported;During functional activity Standing balance-Leahy Scale: Poor Standing balance comment: F static standing, P fxl mobility                           ADL either performed or assessed with clinical judgement   ADL Overall ADL's : Needs assistance/impaired     Grooming: Wash/dry face;Brushing hair;Set up;Sitting   Upper Body Bathing: Minimal assistance;Sitting Upper Body Bathing Details (indicate cue type and reason): sink-side on BSC     Upper Body Dressing : Set up;Sitting                   Functional mobility during ADLs: Minimal assistance;Moderate assistance (hand held assist to take ~3-4 steps from sink-side to EOB sitting)       Vision Patient Visual Report: No change from baseline     Perception     Praxis      Cognition Arousal/Alertness: Awake/alert Behavior During Therapy: WFL for tasks assessed/performed Overall Cognitive Status: No family/caregiver present to determine baseline cognitive functioning Area of Impairment: Safety/judgement;Problem solving                       Following Commands: Follows one step commands consistently   Safety/Judgement: Decreased awareness of safety;Decreased awareness of deficits   Problem Solving: Slow processing;Difficulty sequencing;Requires verbal cues;Requires tactile cues General Comments: Pt is pleasant and partiicpatory, he is somewhat impulsive requirng cues for safety. Able to follow all commands with increased processing time        Exercises Other Exercises Other Exercises: OT engages pt in seated  UB ADLs sink-side including bathing while seated on BSC at sink, to wash hair. Pt requires MOD A, hand held assist bilaterally, and cues for safety with transfers and demos poor eccentric control with transfer back to bed at end of session.   Shoulder Instructions       General Comments      Pertinent Vitals/ Pain       Pain Assessment: No/denies pain  Home Living                                          Prior Functioning/Environment              Frequency  Min 2X/week        Progress Toward Goals  OT Goals(current goals can now be found in the care plan section)  Progress towards OT goals: Progressing toward goals  Acute Rehab OT Goals Patient Stated Goal: to get stronger, go home OT Goal Formulation: With patient/family Time For Goal Achievement: 12/19/20 Potential to Achieve Goals: Good  Plan Discharge plan remains appropriate;Frequency remains appropriate    Co-evaluation                 AM-PAC OT "6 Clicks" Daily Activity     Outcome Measure   Help from another person eating meals?: None Help from another person taking care of personal grooming?: A Little Help from another person toileting, which includes using toliet, bedpan, or urinal?: A Lot Help from another person bathing (including washing, rinsing, drying)?: A Lot Help from another person to put on and taking off regular upper body clothing?: A Lot Help from another person to put on and taking off regular lower body clothing?: A Lot 6 Click Score: 15    End of Session Equipment Utilized During Treatment: Gait belt  OT Visit Diagnosis: Other abnormalities of gait and mobility (R26.89);Repeated falls (R29.6);Muscle weakness (generalized) (M62.81)   Activity Tolerance Patient tolerated treatment well   Patient Left in bed;with call bell/phone within reach;with bed alarm set   Nurse Communication Mobility status        Time: 6967-8938 OT Time Calculation (min): 21  min  Charges: OT General Charges $OT Visit: 1 Visit OT Treatments $Self Care/Home Management : 8-22 mins  Gerrianne Scale, MS, OTR/L ascom [phone] 12/10/20, 4:16 PM

## 2020-12-10 NOTE — Progress Notes (Signed)
Physical Therapy Treatment Patient Details Name: Seth Harper MRN: [id number] DOB: 02-01-1966 Today's Date: 12/10/2020   History of Present Illness Patient is a 55 year old male with alcohol dependence who presented to the ER on 9/19 with progressive shortness of breath, dark colored sputum, back pain and thumb pain. Patient started having hallucinations with increased altered mental status and was intubated. Patient with severe COPD exacerbation and severe acute hypoxic and hypercapnic Respiratory Failure. Patient extubated 10/3.    PT Comments    Patient received in bed, agrees to PT session. Reports he slept very well last night. No complaints. He continues to require mod assist for supine to sit. Min assist for sit to stand and min assist/min guard for ambulation with RW  125 feet. Patient making good progress with mobility and will hopefully be able to return home with assistance.  He will continue to benefit from skilled PT while here to improve safety and independence with mobility.    Recommendations for follow up therapy are one component of a multi-disciplinary discharge planning process, led by the attending physician.  Recommendations may be updated based on patient status, additional functional criteria and insurance authorization.  Follow Up Recommendations  Home health PT;Supervision for mobility/OOB     Equipment Recommendations  None recommended by PT    Recommendations for Other Services       Precautions / Restrictions Precautions Precautions: Fall Precaution Comments: MD note indicates L1 compression fx Restrictions Weight Bearing Restrictions: No     Mobility  Bed Mobility Overal bed mobility: Needs Assistance Bed Mobility: Supine to Sit;Sit to Supine     Supine to sit: Mod assist Sit to supine: Min assist   General bed mobility comments: Min-Mod A for trunk elevation to come fully to sitting EOB, min assist to bring legs back onto bed     Transfers Overall transfer level: Needs assistance Equipment used: Rolling walker (2 wheeled) Transfers: Sit to/from Stand Sit to Stand: From elevated surface;Min assist         General transfer comment: Cues for hand placement.  Ambulation/Gait Ambulation/Gait assistance: Min guard Gait Distance (Feet): 125 Feet Assistive device: Rolling walker (2 wheeled) Gait Pattern/deviations: Step-through pattern;Trunk flexed Gait velocity: decr   General Gait Details: Cues for sequencing, safety, proximity to AD   Stairs             Wheelchair Mobility    Modified Rankin (Stroke Patients Only)       Balance Overall balance assessment: Needs assistance Sitting-balance support: Feet supported Sitting balance-Leahy Scale: Good     Standing balance support: Bilateral upper extremity supported;During functional activity Standing balance-Leahy Scale: Fair Standing balance comment: Requires B UE support and min assist                            Cognition Arousal/Alertness: Awake/alert Behavior During Therapy: WFL for tasks assessed/performed Overall Cognitive Status: No family/caregiver present to determine baseline cognitive functioning Area of Impairment: Safety/judgement;Problem solving                 Orientation Level: Disoriented to     Following Commands: Follows one step commands consistently Safety/Judgement: Decreased awareness of safety;Decreased awareness of deficits   Problem Solving: Slow processing;Difficulty sequencing;Requires verbal cues;Requires tactile cues        Exercises      General Comments        Pertinent Vitals/Pain Pain Assessment: No/denies pain    Home  Living                      Prior Function            PT Goals (current goals can now be found in the care plan section) Acute Rehab PT Goals Patient Stated Goal: to get stronger, go home PT Goal Formulation: With patient Time For Goal  Achievement: 12/19/20 Potential to Achieve Goals: Good Progress towards PT goals: Progressing toward goals    Frequency    Min 2X/week      PT Plan Discharge plan needs to be updated    Co-evaluation              AM-PAC PT "6 Clicks" Mobility   Outcome Measure  Help needed turning from your back to your side while in a flat bed without using bedrails?: A Little Help needed moving from lying on your back to sitting on the side of a flat bed without using bedrails?: A Lot Help needed moving to and from a bed to a chair (including a wheelchair)?: A Little Help needed standing up from a chair using your arms (e.g., wheelchair or bedside chair)?: A Little Help needed to walk in hospital room?: A Little Help needed climbing 3-5 steps with a railing? : A Lot 6 Click Score: 16    End of Session Equipment Utilized During Treatment: Gait belt Activity Tolerance: Patient tolerated treatment well Patient left: in bed;with bed alarm set;with call bell/phone within reach Nurse Communication: Mobility status PT Visit Diagnosis: Unsteadiness on feet (R26.81);Muscle weakness (generalized) (M62.81);Difficulty in walking, not elsewhere classified (R26.2);History of falling (Z91.81)     Time: 0938-1829 PT Time Calculation (min) (ACUTE ONLY): 14 min  Charges:  $Gait Training: 8-22 mins                     Shem Plemmons, PT, GCS 12/10/20,9:43 AM

## 2020-12-10 NOTE — Progress Notes (Signed)
Received a phone call from wife of patient, Seth Harper ( [phone]) She voiced concerns about the discharge to home for this patient. According to wife she was told there were no "charity care" , spaces for patient to be discharged to. She is quite concerned the patient will be sent home with no one there to care for him. She is an amputee and states there is no way she can care for him. I passed this information on to the Administrative Coordinator to see how to best address these concerns and to whom this can be delegated to for goals of care.

## 2020-12-10 NOTE — Plan of Care (Signed)

## 2020-12-11 LAB — MAGNESIUM: Magnesium: 1.9 mg/dL (ref 1.7–2.4)

## 2020-12-11 LAB — PHOSPHORUS: Phosphorus: 4.2 mg/dL (ref 2.5–4.6)

## 2020-12-11 MED ORDER — CHLORDIAZEPOXIDE HCL 25 MG PO CAPS
25.0000 mg | ORAL_CAPSULE | Freq: Two times a day (BID) | ORAL | Status: DC
  Administered 2020-12-11 (×2): 25 mg via ORAL
  Filled 2020-12-11: qty 1

## 2020-12-11 MED ORDER — SENNOSIDES-DOCUSATE SODIUM 8.6-50 MG PO TABS
1.0000 | ORAL_TABLET | Freq: Two times a day (BID) | ORAL | Status: DC
  Administered 2020-12-11 – 2020-12-12 (×3): 1 via ORAL
  Filled 2020-12-11 (×3): qty 1

## 2020-12-11 NOTE — Progress Notes (Signed)
Physical Therapy Treatment Patient Details Name: Seth Harper MRN: [id number] DOB: [birth year] Today's Date: 12/11/2020   History of Present Illness Patient is a 55 year old male with alcohol dependence who presented to the ER on 9/19 with progressive shortness of breath, dark colored sputum, back pain and thumb pain. Patient started having hallucinations with increased altered mental status and was intubated. Patient with severe COPD exacerbation and severe acute hypoxic and hypercapnic Respiratory Failure. Patient extubated 10/3.    PT Comments    Pt pleasant and motivated t/o the session and showed great effort.  He was able to do 3 bouts of ambulation with seated rest breaks between.  However given his fatigue (HR consistently reaching near 150bpm by the end of each ambulation attempt) he was only able to ambulate 45, 50 then 100 ft before needing to stop/sit/rest.  Pt's biggest issue appears to be strength and sequencing to rise from standard height surfaces.  He needed plenty of repeat cuing and often at least light assist to insure fully getting to standing (on most attempts w/o assist he was unable to fully attain standing and uncontrolledly "crashed" back down into the recliner.     Recommendations for follow up therapy are one component of a multi-disciplinary discharge planning process, led by the attending physician.  Recommendations may be updated based on patient status, additional functional criteria and insurance authorization.  Follow Up Recommendations  Home health PT;Supervision for mobility/OOB     Equipment Recommendations  Rolling walker with 5" wheels (issued gait belt 10/9)    Recommendations for Other Services       Precautions / Restrictions Precautions Precautions: Fall Restrictions Weight Bearing Restrictions: No     Mobility  Bed Mobility Overal bed mobility: Needs Assistance Bed Mobility: Supine to Sit;Sit to Supine     Supine to sit: Min assist Sit  to supine: Min guard   General bed mobility comments: Pt with excessive/heavy use of bed rails/UEs to get trunk up to sitting; light assist to insure he did not lean right back to his side.  Guidance to get LEs back into bed    Transfers Overall transfer level: Needs assistance Equipment used: Rolling walker (2 wheeled) Transfers: Sit to/from Stand Sit to Stand: Min guard;Min assist;Mod assist         General transfer comment: at least 6 sit to stand efforts t/o the session.  He needed reinforcement essentially each time to insure appropriate U&LE set up/positioning.  Pt struggled with each effort, often needing multiple attempts (and concurrent repeated cuing) and typically some light CGA to min assist to keep hips/momentum going forward.  Pt again with poor eccentric control returning to sitting needing consistent reminders to insure safety.  When he did have rail to pull from he did not need direct assist to rise  Ambulation/Gait Ambulation/Gait assistance: Min guard Gait Distance (Feet): 100 Feet Assistive device: Rolling walker (2 wheeled)       General Gait Details: pt able to do multiple bouts of ambulation with seated rest breaks between each effort.  He went 45 then 50 then 100 ft.  with each bout he had heavy reliance on the walker, typically had light veering to the L, showed ataxia with poor control during swing through/foot placement and had significant fatigue with these relatively modest distances - HR typically reaching the 140s and after rest breaks generally only going down to the 120s.  Pt sweating and reporting significant fatigue/weakness but remained very motivated t/o multiple bouts  of ambulation.   Stairs             Wheelchair Mobility    Modified Rankin (Stroke Patients Only)       Balance Overall balance assessment: Needs assistance Sitting-balance support: Feet supported Sitting balance-Leahy Scale: Good Sitting balance - Comments: some  struggle to attain sitting EOB, once upright he was able to maintain sitting w/o assist   Standing balance support: Bilateral upper extremity supported;During functional activity Standing balance-Leahy Scale: Fair Standing balance comment: Consistently struggled to transitions to standing but once up able to maintain static balance with UE support, ataxic movements making dynamic tasks very UE reliant and less steady.                            Cognition Arousal/Alertness: Awake/alert Behavior During Therapy: WFL for tasks assessed/performed Overall Cognitive Status: No family/caregiver present to determine baseline cognitive functioning                                        Exercises General Exercises - Lower Extremity Long Arc Quad: Strengthening;10 reps Heel Slides: Strengthening;10 reps Hip ABduction/ADduction: Strengthening;10 reps Hip Flexion/Marching: Strengthening;10 reps Other Exercises Other Exercises: HHA balance acts including heel raises X 3-5 second holds, attempts at static standing with faded UE support Other Exercises: issued and educated on HEP for simple standing balance and LE strengthening exercises.    General Comments General comments (skin integrity, edema, etc.): Highly motivated, quick to fatigue (with HR consistently in the 140s during ambulation) ataxic and general weakness      Pertinent Vitals/Pain Pain Assessment:  (soreness from being in bed, only tolerated sitting in recliner ~10 minutes today before requesting to get back into bed due to buttock pain (even placed pillows in recliner to ease the pressure))    Home Living                      Prior Function            PT Goals (current goals can now be found in the care plan section) Progress towards PT goals: Progressing toward goals    Frequency    Min 2X/week      PT Plan Current plan remains appropriate    Co-evaluation               AM-PAC PT "6 Clicks" Mobility   Outcome Measure  Help needed turning from your back to your side while in a flat bed without using bedrails?: A Little Help needed moving from lying on your back to sitting on the side of a flat bed without using bedrails?: A Little Help needed moving to and from a bed to a chair (including a wheelchair)?: A Little Help needed standing up from a chair using your arms (e.g., wheelchair or bedside chair)?: A Little Help needed to walk in hospital room?: A Little Help needed climbing 3-5 steps with a railing? : A Lot 6 Click Score: 17    End of Session Equipment Utilized During Treatment: Gait belt Activity Tolerance: Patient limited by fatigue;Patient tolerated treatment well Patient left: in bed;with bed alarm set;with call bell/phone within reach Nurse Communication: Mobility status PT Visit Diagnosis: Unsteadiness on feet (R26.81);Muscle weakness (generalized) (M62.81);Difficulty in walking, not elsewhere classified (R26.2);History of falling (Z91.81)     Time: 7858-8502 PT Time  Calculation (min) (ACUTE ONLY): 72 min  Charges:  $Gait Training: 23-37 mins $Therapeutic Exercise: 23-37 mins $Therapeutic Activity: 8-22 mins                     Malachi Pro, DPT 12/11/2020, 12:37 PM

## 2020-12-11 NOTE — TOC Progression Note (Addendum)
Transition of Care (TOC) - Progression Note    Patient Details  Name: [name] MRN: [id number] Date of Birth: 09/20/1965  Transition of Care (TOC) CM/SW Contact   C , LCSW Phone Number: 12/11/2020, 10:17 AM  Clinical Narrative:  Met with patient at bedside. Discussed plan for discharge tomorrow. He is agreeable to home health. Will need to make charity referral. [facility] is charity HH agency through tomorrow so left representative a voicemail. Patient confirmed he does not have a PCP. Will need to call [facility] offices to see if anyone can accept him. Will need to ask physician advisors/director on call to cover HH orders until new PCP appointment. DME recommendation for 3-in-1 but patient declines. He reports having bedside commodes, shower chair, and walker at home. Patient has a friend coming into town tomorrow from [place] and he will be staying with him for the next week.  10:38 am: [facility] representative is unable to accept any charity referrals this weekend due to staffing. She asked that TOC call her again tomorrow.  Expected Discharge Plan:  (TBD) Barriers to Discharge: Inadequate or no insurance, Continued Medical Work up  Expected Discharge Plan and Services Expected Discharge Plan:  (TBD) In-house Referral: Clinical Social Work   Post Acute Care Choice: Skilled Nursing Facility Living arrangements for the past 2 months: Single Family Home                                       Social Determinants of Health (SDOH) Interventions    Readmission Risk Interventions No flowsheet data found.  

## 2020-12-11 NOTE — Progress Notes (Signed)
[facility]  - [place] at [facility]   PATIENT NAME: Seth Harper    MR#:  [id number]  DATE OF BIRTH:  11-09-1965  SUBJECTIVE:   patient doing very well. He ambulated with physical therapy to the nurses station yday with min assist.  Working alternately with PT and OT   REVIEW OF SYSTEMS:   Review of Systems  Constitutional:  Negative for chills, fever and weight loss.  HENT:  Negative for ear discharge, ear pain and nosebleeds.   Eyes:  Negative for blurred vision, pain and discharge.  Respiratory:  Negative for sputum production, shortness of breath, wheezing and stridor.   Cardiovascular:  Negative for chest pain, palpitations, orthopnea and PND.  Gastrointestinal:  Negative for abdominal pain, diarrhea, nausea and vomiting.  Genitourinary:  Negative for frequency and urgency.  Musculoskeletal:  Negative for back pain and joint pain.  Neurological:  Negative for sensory change, speech change, focal weakness and weakness.  Psychiatric/Behavioral:  Positive for hallucinations. Negative for depression. The patient is not nervous/anxious.   Tolerating Diet:yes Tolerating PT: recommends rehab--but pt progressing well with PT  DRUG ALLERGIES:  No Known Allergies  VITALS:  Blood pressure 118/75, pulse (!) 108, temperature 98 F (36.7 C), resp. rate 18, height 6\' 3"  (1.905 m), weight 122.2 kg, SpO2 94 %.  PHYSICAL EXAMINATION:   Physical Exam  GENERAL:  55 y.o.-year-old patient lying in the bed with no acute distress.  HEENT: Head atraumatic, normocephalic. Oropharynx and nasopharynx clear.  NECK:  Supple, no jugular venous distention. No thyroid enlargement, no tenderness.  LUNGS: Normal breath sounds bilaterally, no wheezing, rales, rhonchi. No use of accessory muscles of respiration.  CARDIOVASCULAR: S1, S2 normal. No murmurs, rubs, or gallops. Mild tachy ABDOMEN: Soft, nontender, nondistended. Bowel sounds present. No organomegaly or mass.  EXTREMITIES:  No cyanosis, clubbing or edema b/l.    NEUROLOGIC: Cranial nerves II through XII are intact. Weakness  Ataxia+ PSYCHIATRIC:  patient is alert and oriented x 2 SKIN: No obvious rash, lesion, or ulcer.   LABORATORY PANEL:  CBC Recent Labs  Lab 12/06/20 0505  WBC 11.8*  HGB 13.0  HCT 37.1*  PLT 535*     Chemistries  Recent Labs  Lab 12/07/20 0616 12/08/20 0210 12/11/20 0412  NA 138  --   --   K 3.5  --   --   CL 100  --   --   CO2 28  --   --   GLUCOSE 100*  --   --   BUN 13  --   --   CREATININE 0.67  --   --   CALCIUM 8.8*  --   --   MG 2.0   < > 1.9   < > = values in this interval not displayed.    Cardiac Enzymes No results for input(s): TROPONINI in the last 168 hours. RADIOLOGY:  No results found. ASSESSMENT AND PLAN:  [name] is a 55 year old male with history of alcohol abuse/dependence who presented to the ER on 9/19 with progressive shortness of breath, dark colored sputum, back pain and thumb pain. He started to have hallucinations with increasing altered mentation while in the ER. Per pt's family usually drinks 1.5 gallons of rum per week and his last drink was on Friday prior to the fall. Due to his increasing agitation and altered mentation he was sedated and intubated in the ER.  Acute hypoxic hypercapnic respiratory failure.  Patient extubated on 12/05/2020.  Currently  on nasal cannula.  Patient states he does not wear oxygen at home. --wean as able to pt off oxygen  COPD exacerbation.  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   --Start to taper Librium.  On phenobarbital taper. --pt's mentation is much better and improving daily --pt's asymptomatic of HR. Increases with activity but settles down. Cont to monitor  Aspiration pneumonia completed antibiotics.  Removed PICC line --Patient had  bronchoscopy.  Pleural effusion.  Had thoracentesis done 1.2 liter fluid removed  Weakness.  Physical therapy recommending rehab but without insurance may be difficult.   Discussed at length with patient's wife Revonda Standard at bedside. wife is frustrated about the situation regarding patient not able to go to rehab. Explained that there is no charity rehab. Will set up charity physical therapy and provide equipment if able to for home. Asked wife to discuss with church members and friends and family to help around.  10/8-- patient ambulated to the nurses station with minimal assist. Continue alternate PT OT on daily basis. 10/9 cont PT today. Getting stronger!  Discussed with wife in the room. Plans to d/c tomorrow    Procedures: Family communication :wife allision   12/11/2020 Consults : CODE STATUS: full DVT Prophylaxis :heparin Level of care: Med-Surg Status is: Inpatient  Remains inpatient appropriate because:Unsafe d/c plan  Dispo: The patient is from: Home              Anticipated d/c is to: likely monday              Patient currently is medically stable to d/c.   Difficult to place patient YES         TOTAL TIME TAKING CARE OF THIS PATIENT: 20 minutes.  >50% time spent on counselling and coordination of care  Note: This dictation was prepared with Dragon dictation along with smaller phrase technology. Any transcriptional errors that result from this process are unintentional.  Enedina Finner M.D    [facility]   CC: Primary care physician; Patient, No Pcp Per (Inactive) Patient ID: Seth Harper, male   DOB: 07/27/1965, 55 y.o.   MRN: [id number]

## 2020-12-11 NOTE — Plan of Care (Signed)

## 2020-12-12 MED ORDER — QUETIAPINE FUMARATE 25 MG PO TABS: 25.0000 mg | ORAL_TABLET | Freq: Every day | ORAL | 0 refills | Status: AC

## 2020-12-12 MED ORDER — PANTOPRAZOLE SODIUM 40 MG PO TBEC: 40.0000 mg | DELAYED_RELEASE_TABLET | Freq: Every day | ORAL | 0 refills | Status: AC

## 2020-12-12 MED ORDER — ADULT MULTIVITAMIN W/MINERALS CH: 1.0000 | ORAL_TABLET | Freq: Every day | ORAL | 0 refills | Status: AC

## 2020-12-12 NOTE — Progress Notes (Signed)
Follow up with Seth Harper [employer]). Shared with her conversation and concerns from wife from Saturday call. She stated patient will be discharged to home, there are currently no other solutions. There is an attempt to find the patient a PCP as well as additional support services.

## 2020-12-12 NOTE — Discharge Summary (Signed)
[facility] - [place] at [facility]   PATIENT NAME: Seth Harper    MR#:  [id number]  DATE OF BIRTH:  08-03-1965  DATE OF ADMISSION:  11/21/2020 ADMITTING PHYSICIAN: Martina Sinner, MD  DATE OF DISCHARGE: 12/12/2020  PRIMARY CARE PHYSICIAN: Patient, No Pcp Per (Inactive)    ADMISSION DIAGNOSIS:  Alcohol withdrawal (HCC) [F10.939] SOB (shortness of breath) [R06.02] Pain [R52] Altered mental status, unspecified altered mental status type [R41.82]  DISCHARGE DIAGNOSIS:  Severe Alcohol withdrawal with DT's Acute hypoxic respiratory failure with Aspiration PNA, COPD flare and Pleural effusion requiring mechanical ventilator and thoracentesis Ataxia  SECONDARY DIAGNOSIS:   Past Medical History:  Diagnosis Date  . Asthma     HOSPITAL COURSE:   Seth Harper is a 55 year old male with history of alcohol abuse/dependence who presented to the ER on 9/19 with progressive shortness of breath, dark colored sputum, back pain and thumb pain. He started to have hallucinations with increasing altered mentation while in the ER. Per pt's family usually drinks 1.5 gallons of rum per week and his last drink was on Friday prior to the fall. Due to his increasing agitation and altered mentation he was sedated and intubated in the ER.   Acute hypoxic hypercapnic respiratory failure.  Patient extubated on 12/05/2020.  Currently on nasal cannula.  Patient states he does not wear oxygen at home. --wean as able to pt off oxygen   COPD exacerbation. 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   --Start to taper Librium--now d/ced.  On phenobarbital taper--now d/ced --pt's mentation is much better and improving daily --pt's asymptomatic of HR. Increases with activity but settles down. Cont to monitor   Aspiration pneumonia completed  antibiotics.  Removed PICC line --Patient had bronchoscopy.   Pleural effusion.  Had thoracentesis done 1.2 liter fluid removed   Weakness.  Physical therapy recommending rehab but without insurance may be difficult.   Discussed at length with patient's wife Revonda Standard at bedside. wife is frustrated about the situation regarding patient not able to go to rehab. Explained that there is no charity rehab. Will set up charity physical therapy and provide equipment if able to for home. Asked wife to discuss with church members and friends and family to help around.   10/8-- patient ambulated to the nurses station with minimal assist. Continue alternate PT OT on daily basis. 10/9 cont PT today. Getting stronger! -10/10--doing well. D/c home today. Pt agreeable. [facility]   Discussed with wife in the room 10/9    Procedures: Family communication :wife allision   12/11/2020 Consults : CODE STATUS: full DVT Prophylaxis :heparin Level of care: Med-Surg Status is: Inpatient    Dispo: The patient is from: Home              Anticipated d/c is to: today              Patient currently is medically stable to d/c.                     CONSULTS OBTAINED:  Treatment Team:  Lynn Ito, MD  DRUG ALLERGIES:  No Known Allergies  DISCHARGE MEDICATIONS:   Allergies as of 12/12/2020   No Known Allergies      Medication List     STOP taking these medications    ibuprofen 200 MG tablet Commonly known as: ADVIL  TAKE these medications    multivitamin with minerals Tabs tablet Take 1 tablet by mouth daily.   pantoprazole 40 MG tablet Commonly known as: PROTONIX Take 1 tablet (40 mg total) by mouth at bedtime.   QUEtiapine 25 MG tablet Commonly known as: SEROQUEL Take 1 tablet (25 mg total) by mouth at bedtime.        If you experience worsening of your admission symptoms, develop shortness of breath, life threatening emergency, suicidal or homicidal thoughts you  must seek medical attention immediately by calling 911 or calling your MD immediately  if symptoms less severe.  You Must read complete instructions/literature along with all the possible adverse reactions/side effects for all the Medicines you take and that have been prescribed to you. Take any new Medicines after you have completely understood and accept all the possible adverse reactions/side effects.   Please note  You were cared for by a hospitalist during your hospital stay. If you have any questions about your discharge medications or the care you received while you were in the hospital after you are discharged, you can call the unit and asked to speak with the hospitalist on call if the hospitalist that took care of you is not available. Once you are discharged, your primary care physician will handle any further medical issues. Please note that NO REFILLS for any discharge medications will be authorized once you are discharged, as it is imperative that you return to your primary care physician (or establish a relationship with a primary care physician if you do not have one) for your aftercare needs so that they can reassess your need for medications and monitor your lab values. Today   SUBJECTIVE  No new complaints   VITAL SIGNS:  Blood pressure 109/80, pulse (!) 105, temperature 99.9 F (37.7 C), resp. rate 17, height 6\' 3"  (1.905 m), weight 122.2 kg, SpO2 93 %.  I/O:   Intake/Output Summary (Last 24 hours) at 12/12/2020 0826 Last data filed at 12/11/2020 1644 Gross per 24 hour  Intake 120 ml  Output 600 ml  Net -480 ml    PHYSICAL EXAMINATION:  GENERAL:  55 y.o.-year-old patient lying in the bed with no acute distress.  LUNGS: Normal breath sounds bilaterally, no wheezing, rales,rhonchi or crepitation. No use of accessory muscles of respiration.  CARDIOVASCULAR: S1, S2 normal. No murmurs, rubs, or gallops.  ABDOMEN: Soft, non-tender, non-distended. Bowel sounds present. No  organomegaly or mass.  EXTREMITIES: No pedal edema, cyanosis, or clubbing.  NEUROLOGIC: Cranial nerves II through XII are intact. Muscle strength 5/5 in all extremities. Sensation intact. Ataxia+ PSYCHIATRIC:patient is alert and oriented x 3.  SKIN: No obvious rash, lesion, or ulcer.   DATA REVIEW:   CBC  Recent Labs  Lab 12/06/20 0505  WBC 11.8*  HGB 13.0  HCT 37.1*  PLT 535*    Chemistries  Recent Labs  Lab 12/07/20 0616 12/08/20 0210 12/11/20 0412  NA 138  --   --   K 3.5  --   --   CL 100  --   --   CO2 28  --   --   GLUCOSE 100*  --   --   BUN 13  --   --   CREATININE 0.67  --   --   CALCIUM 8.8*  --   --   MG 2.0   < > 1.9   < > = values in this interval not displayed.    Microbiology Results   No  results found for this or any previous visit (from the past 240 hour(s)).  RADIOLOGY:  No results found.   CODE STATUS:     Code Status Orders  (From admission, onward)           Start     Ordered   11/21/20 1534  Full code  Continuous        11/21/20 1536           Code Status History     This patient has a current code status but no historical code status.        TOTAL TIME TAKING CARE OF THIS PATIENT: 40 minutes.    Enedina Finner M.D  [facility]    CC: Primary care physician; Patient, No Pcp Per (Inactive)

## 2020-12-12 NOTE — TOC Progression Note (Signed)
Transition of Care [facility]) - Progression Note    Patient Details  Name: Dareld Mcauliffe MRN: [id number] Date of Birth: 1966-01-11  Transition of Care [facility]) CM/SW Contact  Caryn Section, RN Phone Number: 12/12/2020, 1:03 PM  Clinical Narrative:  Spoke with patient's wife, she has assistance from a friend for patient and will speak with her church for assistance moving forward.  Explained to patient's wife that he will need a PCP for home health.  Spouse states that she will attempt to contact her PCP to see if they will accommodate patient and that she will contact RNCM after she makes these arrangements.  Spouse did not call back, and RN discharged patient, stating that patient and spouse choose to use teleheath moving forward.  [employer] notified of equipment, was waiting for order from MD and RN discharged patient.  Orders in, [employer] notified, as per Zack they will contact patient and discuss the delivery of equipment.      Expected Discharge Plan:  (TBD) Barriers to Discharge: Inadequate or no insurance, Continued Medical Work up  Expected Discharge Plan and Services Expected Discharge Plan:  (TBD) In-house Referral: Clinical Social Work   Post Acute Care Choice: Skilled Nursing Facility Living arrangements for the past 2 months: Single Family Home Expected Discharge Date: 12/12/20                                     Social Determinants of Health (SDOH) Interventions    Readmission Risk Interventions No flowsheet data found.

## 2020-12-12 NOTE — Progress Notes (Signed)
Pt and his spouse provided discharge instructions and VSS at the time of d/c. All belongings sent with pt and all questions answered. Pt and family agreed to having DME sent to their home, as they are ready to go at this time and equipment has not yet been delivered to room. Case manager notified.

## 2020-12-27 ENCOUNTER — Other Ambulatory Visit: Payer: Self-pay | Admitting: Family Medicine

## 2020-12-27 DIAGNOSIS — M546 Pain in thoracic spine: Secondary | ICD-10-CM

## 2020-12-27 DIAGNOSIS — M7989 Other specified soft tissue disorders: Secondary | ICD-10-CM

## 2020-12-27 DIAGNOSIS — M545 Low back pain, unspecified: Secondary | ICD-10-CM

## 2020-12-27 LAB — FUNGITELL, SERUM

## 2020-12-28 ENCOUNTER — Ambulatory Visit
Admission: RE | Admit: 2020-12-28 | Discharge: 2020-12-28 | Disposition: A | Discharge status: Home / Self Care | Payer: Medicaid Other | Source: Ambulatory Visit | Attending: Family Medicine | Admitting: Family Medicine

## 2020-12-28 DIAGNOSIS — M546 Pain in thoracic spine: Secondary | ICD-10-CM | POA: Diagnosis present

## 2020-12-28 DIAGNOSIS — M545 Low back pain, unspecified: Secondary | ICD-10-CM | POA: Diagnosis not present

## 2020-12-28 DIAGNOSIS — M7989 Other specified soft tissue disorders: Secondary | ICD-10-CM

## 2020-12-28 IMAGING — MR MR LUMBAR SPINE W/O CM
5 series · 32 of 48 positions shown · non-contrast
Comparison: CT [DATE].

CLINICAL DATA: Fall, pain

EXAM:
MRI LUMBAR SPINE WITHOUT CONTRAST
TECHNIQUE: Multiplanar, multisequence MR imaging of the lumbar spine was
performed. No intravenous contrast was administered.

[Series 1: T2 · sagittal · 4.0mm · 1.09mm/px · 7 of 18 slices shown (1 of 2)]
[im 1/18]
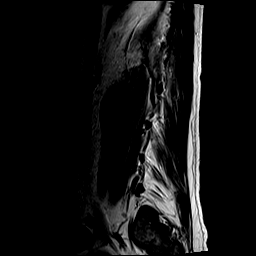
[im 3/18]
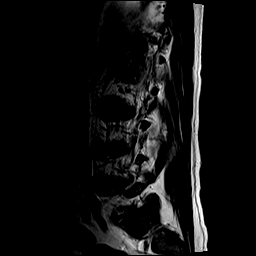
[im 6/18]
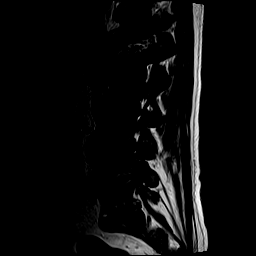
[im 9/18]
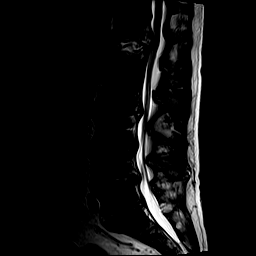
[im 12/18]
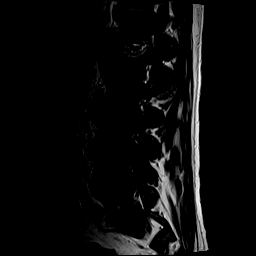
[im 15/18]
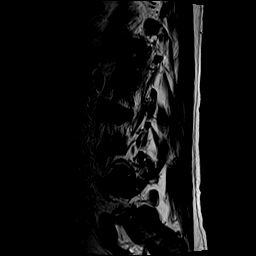
[im 18/18]
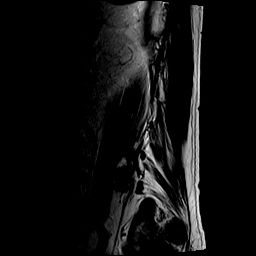

[Series 2: T1 · sagittal · 4.0mm · 1.09mm/px · 7 of 18 slices shown (1 of 2)]
[im 1/18]
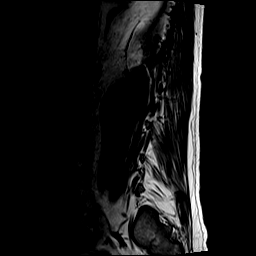
[im 3/18]
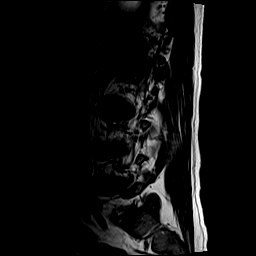
[im 6/18]
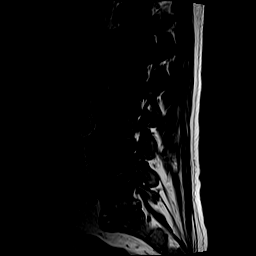
[im 9/18]
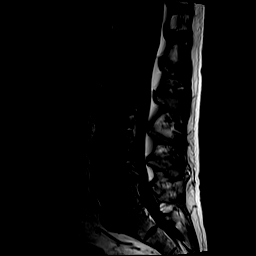
[im 12/18]
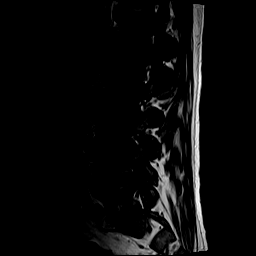
[im 15/18]
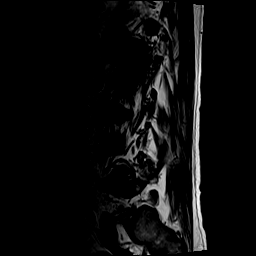
[im 18/18]
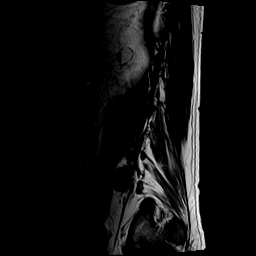

[Series 3: STIR · sagittal · 4.0mm · 0.55mm/px · 2 of 18 slices shown]
[im 1/18]
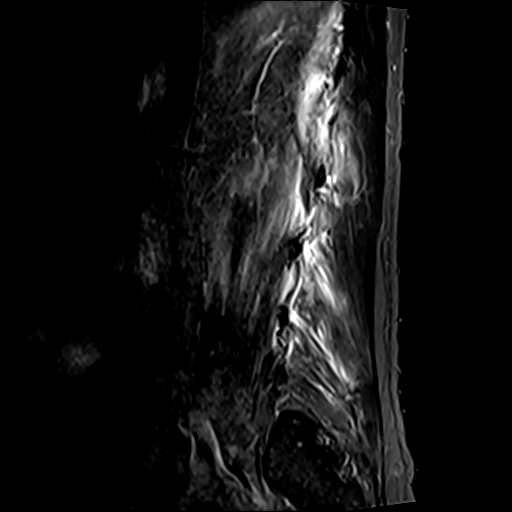
[im 4/18]
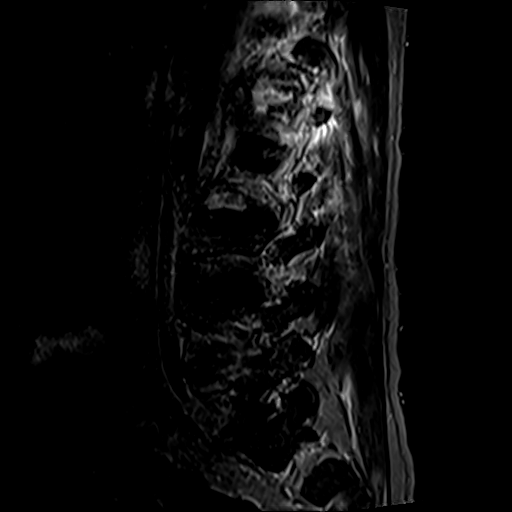

[Series 4: T2 · axial · 4.0mm · 0.78mm/px · z∈[-640,-409]mm · 8 of 40 slices shown (2 of 2)]
[im 1/40]
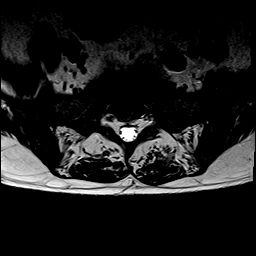
[im 7/40]
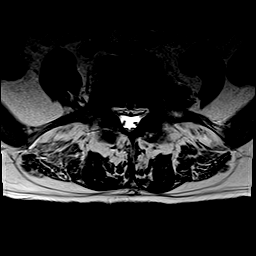
[im 13/40]
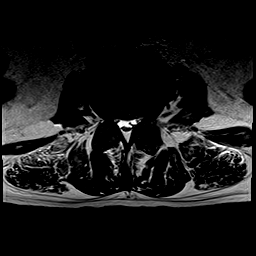
[im 19/40]
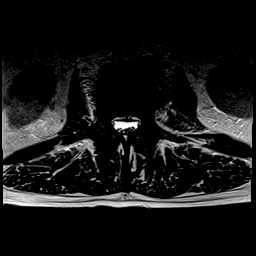
[im 22/40]
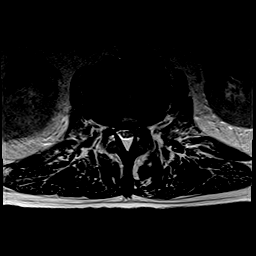
[im 28/40]
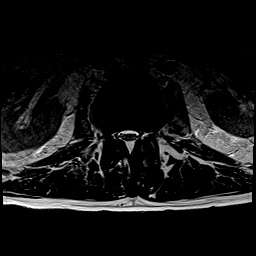
[im 34/40]
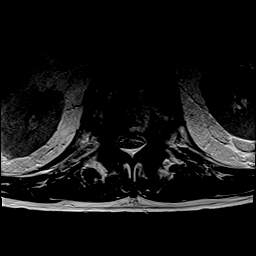
[im 40/40]
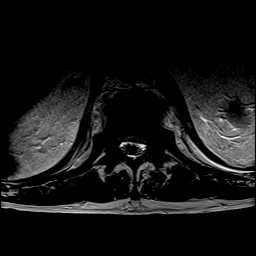

[Series 5: T1 · axial · 4.0mm · 0.39mm/px · z∈[-640,-409]mm · 8 of 40 slices shown (2 of 2)]
[im 1/40]
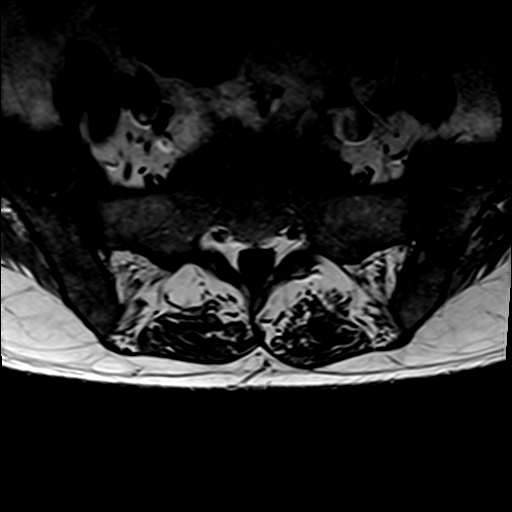
[im 7/40]
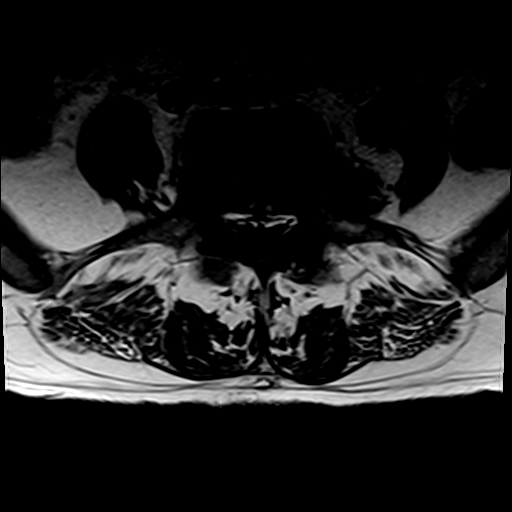
[im 13/40]
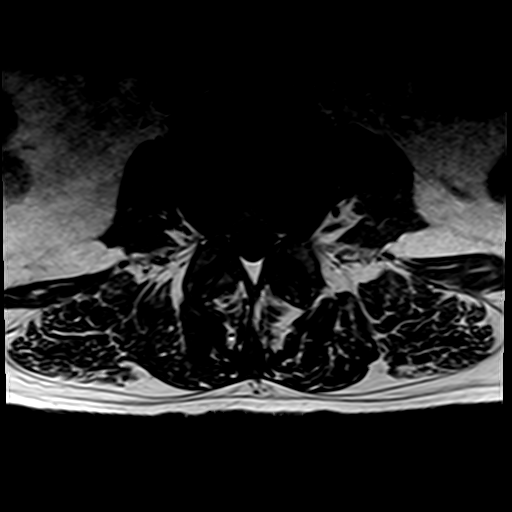
[im 19/40]
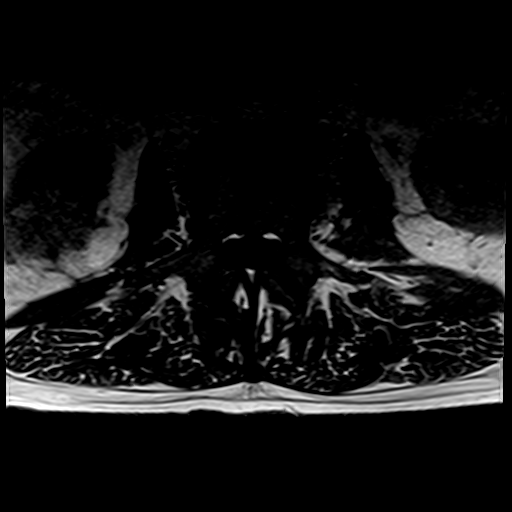
[im 22/40]
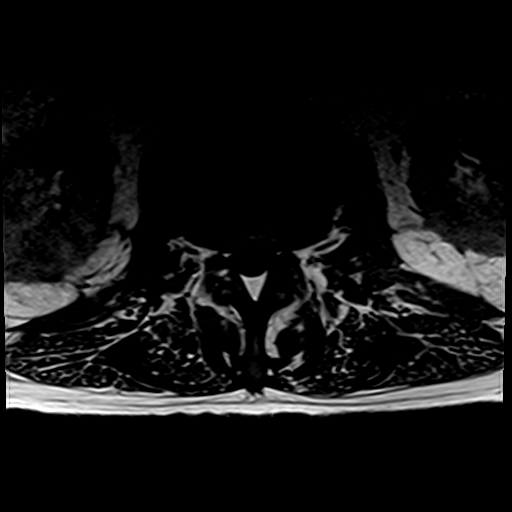
[im 28/40]
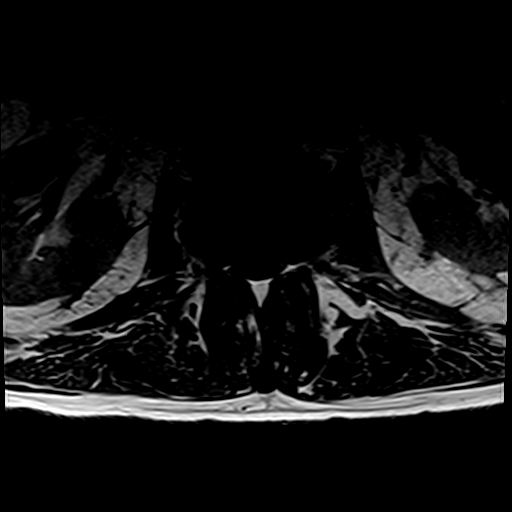
[im 34/40]
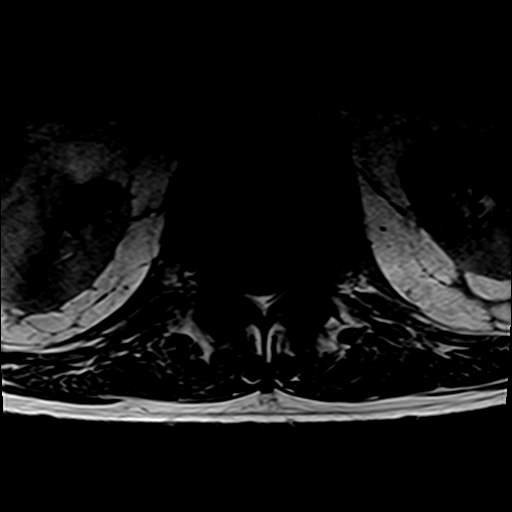
[im 40/40]
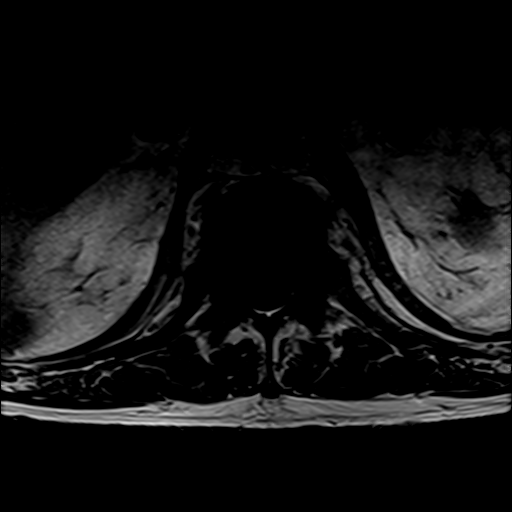

[32 of 48 positions shown; findings below may reference images not displayed]

FINDINGS: Segmentation:  Standard.

Alignment:  Physiologic.

Vertebrae: There is a superior endplate compression fracture of L1
with residual bone marrow edema as well as within the disc space.
There is progressive height loss since the prior CT, now 30%,
previously 15%. There is likely reactive marrow edema within the
posteroinferior endplate of T12. There is diffusely low T1 signal
within the T12 vertebral body as well as L1 extending into the
posterior elements, without corresponding edema signal on STIR.
Degenerative endplate edema along the anterior aspects of L2 and L3.

Conus medullaris and cauda equina: Conus extends to the L1 level.
Conus and cauda equina appear normal.

Paraspinal and other soft tissues: Tiny simple appearing right upper
pole renal cyst. Mild paraspinal atrophy.

Disc levels: There is a prominent posterior epidural fat throughout
the lumbar spine.

T12-L1: Left paracentral tissue protrusion, potentially a
combination of disc material, epidural hematoma, and/or retropulsed
bone resulting in left lateral recess narrowing. Bilateral facet
arthropathy and ligamentum flavum hypertrophy. Overall
moderate-severe spinal canal stenosis and moderate bilateral neural
foraminal narrowing. Potential impingement of the descending
left-sided nerve roots.

L1-L2: Ligamentum flavum hypertrophy and bilateral facet arthropathy
contributing to moderate spinal canal stenosis. No significant
neural foraminal narrowing.

L2-L3: Disc height loss with broad-based disc bulging and bilateral
facet arthropathy results in moderate spinal canal stenosis and
moderate left and mild right neural foraminal narrowing.

L3-L4: Mild broad-based disc bulging, bilateral facet arthropathy
results in mild-to-moderate spinal canal stenosis, moderate to
severe left and no significant right neural foraminal narrowing.

L4-L5: Broad-based disc bulging and bilateral facet arthropathy
results in moderate spinal canal stenosis and severe bilateral
neural foraminal narrowing.

L5-S1: Severe disc height loss with posterior disc bulging and
bilateral facet arthropathy. No significant spinal canal stenosis.
Mild bilateral neural foraminal narrowing.
IMPRESSION: Subacute superior endplate compression fracture of L1, with
progressive height loss since the prior CT, now 30%, previously 15%.
Moderate-severe spinal canal stenosis at this level (T12-L1) and
potential impingement of the descending left-sided nerve roots due
to protruding left paracentral tissue, which may be combination of
disc material, epidural hematoma, or potentially retropulsed bone.

Diffusely low T1 signal throughout the T12 vertebral body as well as
L1 extending into the posterior elements. While this may be reactive
marrow change from the L1 compression fracture, there is not a
proportional degree of corresponding marrow edema, and is concerning
for potential infiltrating lesion. No correlate is seen on prior CT.
Follow-up MRI in 3 months is recommended.

Additional multilevel degenerative disc disease throughout the
lumbar spine, as summarized below:

L1-L2: Moderate spinal canal stenosis. No significant neural
foraminal narrowing.

L2-L3: Moderate spinal canal stenosis. Moderate left and mild right
neural foraminal narrowing.

L3-L4: Mild-to-moderate spinal canal stenosis. Moderate to severe
left neural foraminal narrowing.

L4-L5: Moderate spinal canal stenosis and severe bilateral neural
foraminal narrowing.

L5-S1: Mild bilateral neural foraminal narrowing.

## 2020-12-28 IMAGING — MR MR THORACIC SPINE W/O CM
6 series · 33 of 48 positions shown · non-contrast
Comparison: CT [DATE].

CLINICAL DATA: Back pain, fall

EXAM:
MRI THORACIC SPINE WITHOUT CONTRAST
TECHNIQUE: Multiplanar, multisequence MR imaging of the thoracic spine was
performed. No intravenous contrast was administered.

[Series 18: T1 · sagittal · 6.0mm · 1.88mm/px · 4 of 8 slices shown (1 of 2)]
[im 1/8]
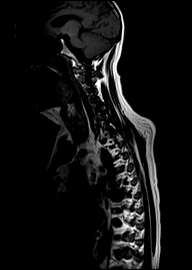
[im 3/8]
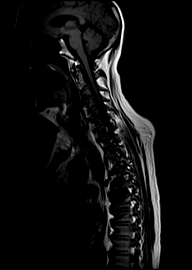
[im 5/8]
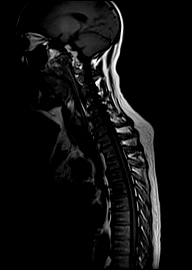
[im 8/8]
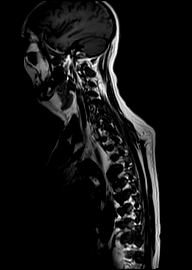

[Series 19: T2 · sagittal · 3.0mm · 1.33mm/px · 6 of 17 slices shown (1 of 2)]
[im 1/17]
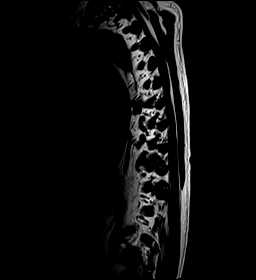
[im 4/17]
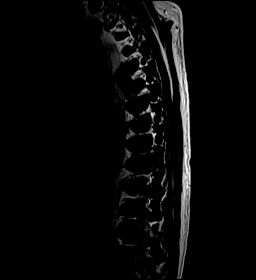
[im 7/17]
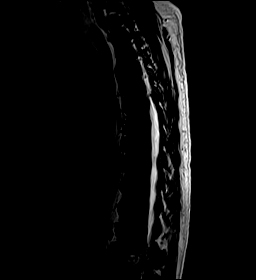
[im 10/17]
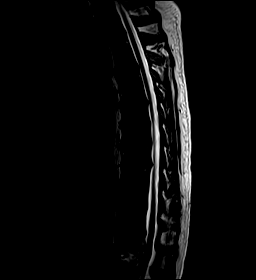
[im 13/17]
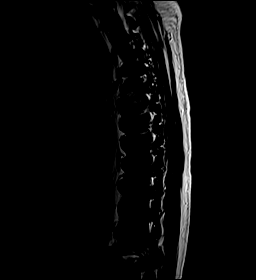
[im 17/17]
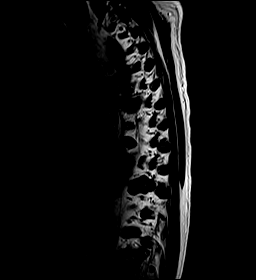

[Series 20: T1 · sagittal · 3.0mm · 1.33mm/px · 5 of 17 slices shown (2 of 2)]
[im 1/17]
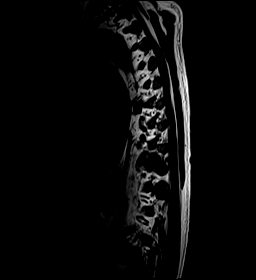
[im 5/17]
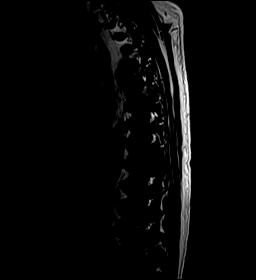
[im 9/17]
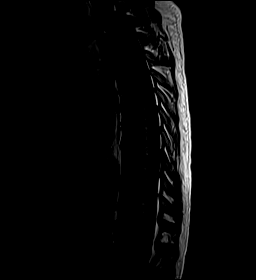
[im 13/17]
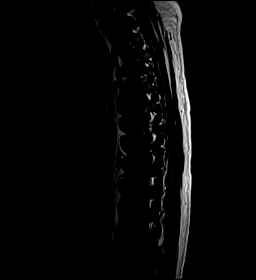
[im 17/17]
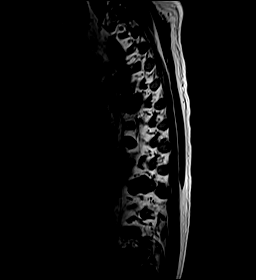

[Series 21: STIR · sagittal · 3.0mm · 0.66mm/px · 5 of 17 slices shown]
[im 1/17]
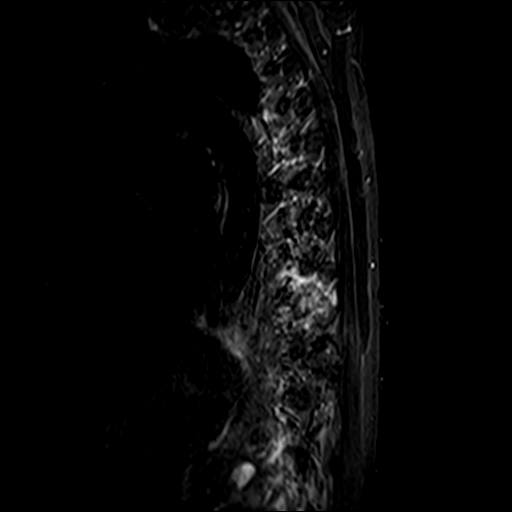
[im 5/17]
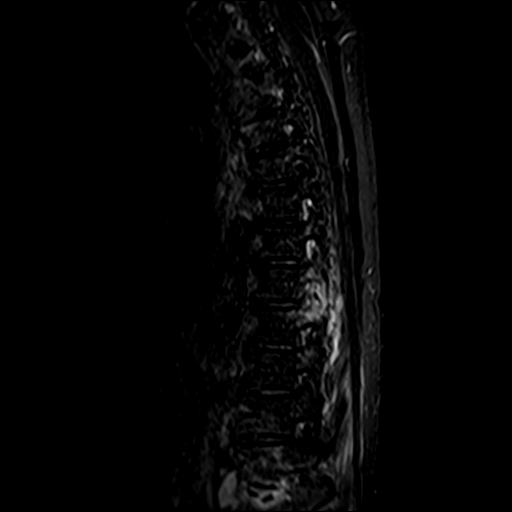
[im 9/17]
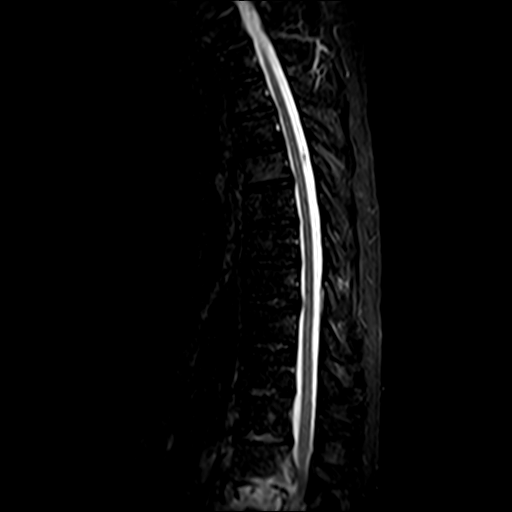
[im 13/17]
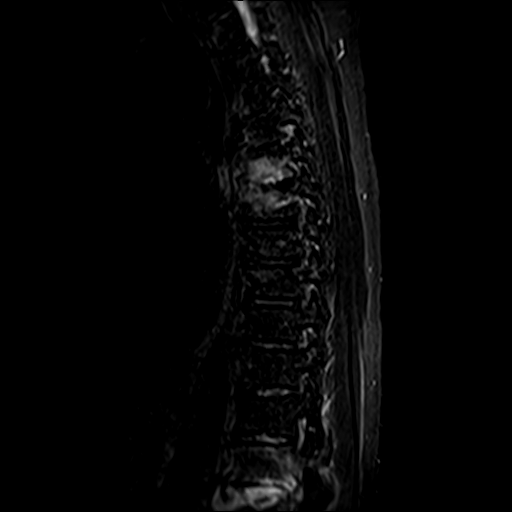
[im 17/17]
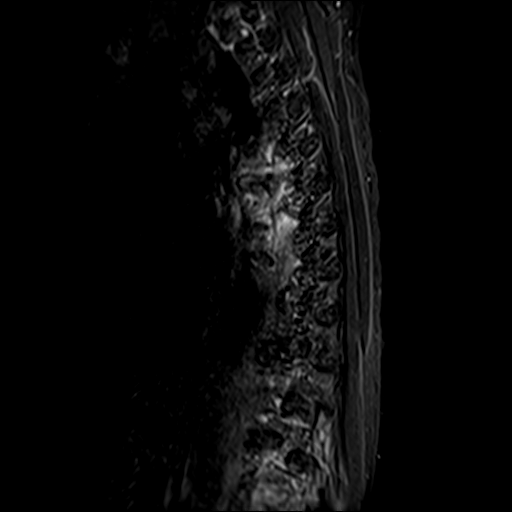

[Series 22: T2 · axial · 4.0mm · 0.59mm/px · z∈[-427,-131]mm · 8 of 45 slices shown (2 of 2)]
[im 1/45]
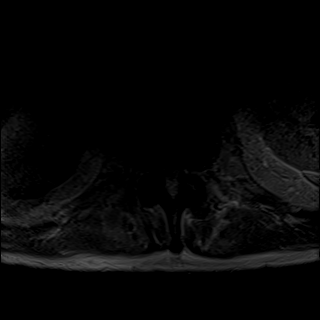
[im 7/45]
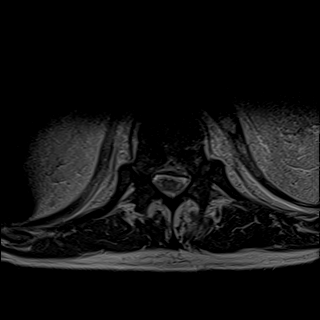
[im 14/45]
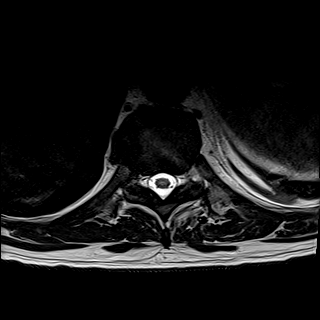
[im 21/45]
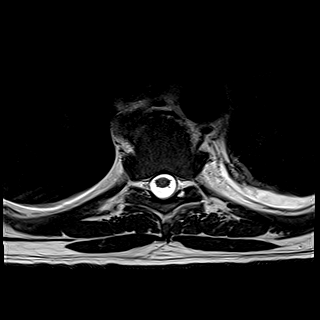
[im 24/45]
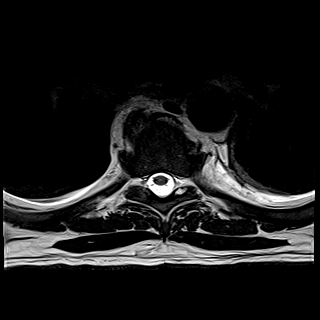
[im 31/45]
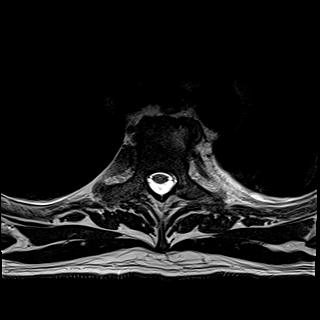
[im 38/45]
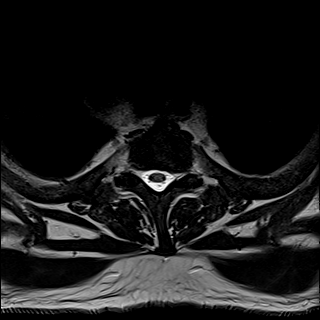
[im 45/45]
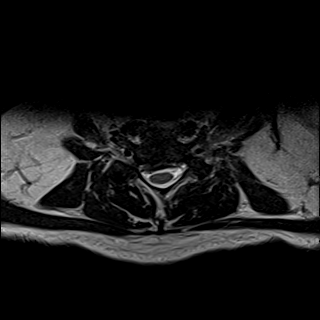

[Series 23: GRE · axial · 4.0mm · 0.37mm/px · z∈[-427,-243]mm · 5 of 45 slices shown]
[im 1/45]
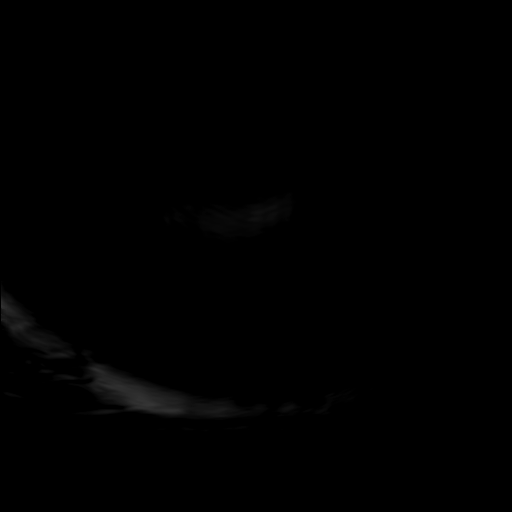
[im 7/45]
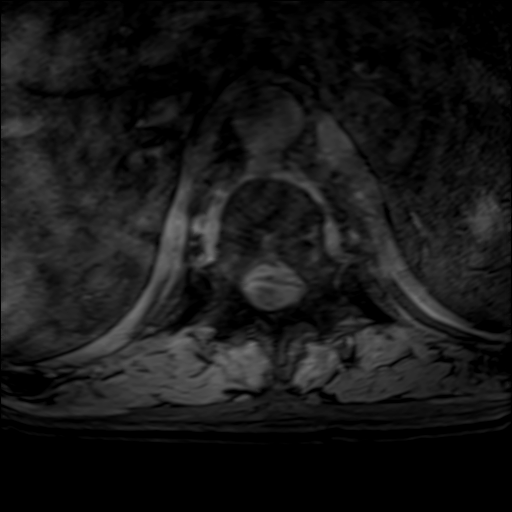
[im 14/45]
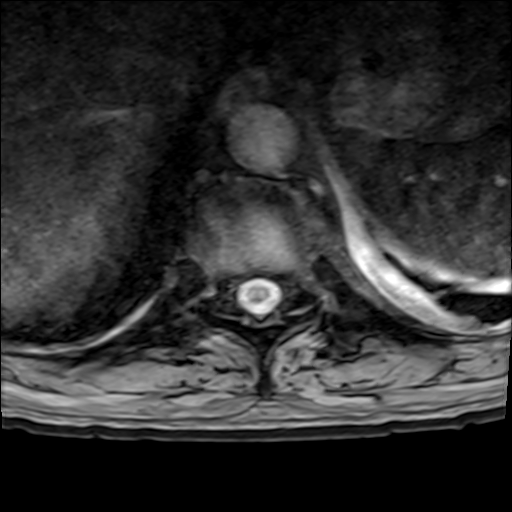
[im 21/45]
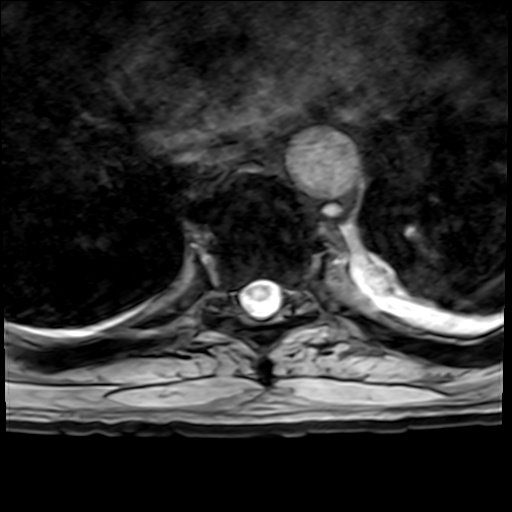
[im 24/45]
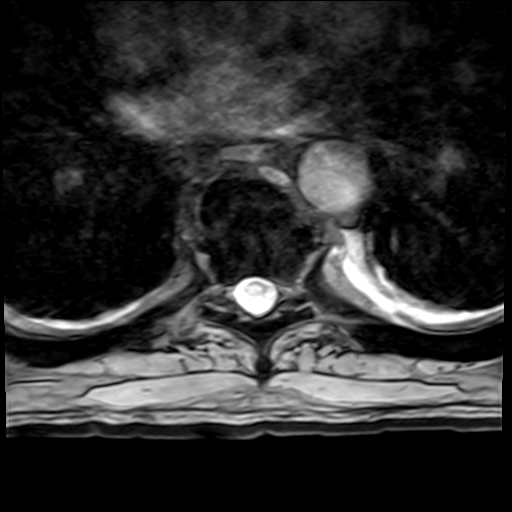

[33 of 48 positions shown; findings below may reference images not displayed]

FINDINGS: Alignment:  Physiologic

Vertebrae: There is marrow edema within the right lateral aspect of
the T5 vertebral body and anterior superior aspect of T6, at a site
of prominent near bridging osteophyte formation, likely reactive
marrow change.

Likely degenerative edema signal within the anterior aspect of T4.

Marrow edema signal within the posterior elements of T8 and T9 on
the left (sagittal stir image 13) with adjacent masslike soft tissue
(T2 axial image [id number]). This corresponds to the region of nodularity
seen on prior CT, and the transverse process appears potentially
newly involved on the left at T9.

Diffusely low T1 signal throughout the T12 and L1 vertebral bodies
extending into the posterior elements. Superior endplate L1
compression fracture as described on separately dictated lumbar
spine MRI.

There is no other evidence of acute thoracic spine fracture.

Cord:  Normal signal and morphology.

Paraspinal and other soft tissues: Left posterior atelectasis and
prominent extrapleural fat.

Disc levels:

There is minimal posterior disc bulging at T3-T4, T5-T6, T8-T9,
T10-T11, and T11-T12. There is loss of neural foraminal fat signal
on the left at T8-T9 (sagittal T2 image 13), and adjacent paraspinal
masslike soft tissue as described above. No other significant spinal
canal stenosis or neural foraminal narrowing.
IMPRESSION: Marrow signal abnormality within the posterior elements on the left
at T8-T9, with paraspinal masslike soft tissue density and potential
lesion newly involving the left T9 transverse process. This results
in effacement of the left T8-T9 neural foramen fat and potential
exiting nerve root impingement. This is in the region of previously
described pleural nodularity a prior chest CT. Repeat chest CT with
contrast now may be useful for direct comparison to assess for
interval change, however ultimately a PET-CT may be necessary for
further evaluation.

Marrow signal abnormality within the right lateral aspect of T5 and
anterior superior aspect of T6, favored to related to degenerative
change with prominent anterolateral osteophyte formation, though the
confluent low T1 signal is concerning for a potential infiltrative
lesion given the findings above.

Please see separately dictated lumbar spine MRI for findings at T12
and L1.

These results were called by telephone at the time of interpretation
on [DATE] at [DATE] to provider GEYLANI , who verbally
acknowledged these results.

## 2022-06-02 LAB — EXTERNAL GENERIC LAB PROCEDURE: COLOGUARD: NEGATIVE

## 2023-01-16 ENCOUNTER — Other Ambulatory Visit: Payer: Self-pay | Admitting: Orthopedic Surgery

## 2023-01-16 DIAGNOSIS — G8929 Other chronic pain: Secondary | ICD-10-CM

## 2023-01-16 DIAGNOSIS — Z8739 Personal history of other diseases of the musculoskeletal system and connective tissue: Secondary | ICD-10-CM

## 2023-01-16 DIAGNOSIS — M12812 Other specific arthropathies, not elsewhere classified, left shoulder: Secondary | ICD-10-CM

## 2023-01-16 DIAGNOSIS — M75102 Unspecified rotator cuff tear or rupture of left shoulder, not specified as traumatic: Secondary | ICD-10-CM

## 2023-01-28 ENCOUNTER — Encounter: Payer: Self-pay | Admitting: Orthopedic Surgery

## 2023-02-08 ENCOUNTER — Ambulatory Visit
Admission: RE | Admit: 2023-02-08 | Discharge: 2023-02-08 | Disposition: A | Discharge status: Home / Self Care | Payer: Medicaid Other | Source: Ambulatory Visit | Attending: Orthopedic Surgery | Admitting: Orthopedic Surgery

## 2023-02-08 DIAGNOSIS — M75102 Unspecified rotator cuff tear or rupture of left shoulder, not specified as traumatic: Secondary | ICD-10-CM

## 2023-02-08 DIAGNOSIS — G8929 Other chronic pain: Secondary | ICD-10-CM

## 2023-02-08 DIAGNOSIS — Z8739 Personal history of other diseases of the musculoskeletal system and connective tissue: Secondary | ICD-10-CM
